# Patient Record
Sex: Female | Born: 1975 | Race: White | Hispanic: No | Marital: Married | Smoking: Former smoker
Health system: Southern US, Community
[De-identification: ages and names within clinical notes are randomized; demographics above are authoritative.]

## PROBLEM LIST (undated history)

## (undated) DIAGNOSIS — G43909 Migraine, unspecified, not intractable, without status migrainosus: Secondary | ICD-10-CM

## (undated) DIAGNOSIS — C4491 Basal cell carcinoma of skin, unspecified: Secondary | ICD-10-CM

## (undated) DIAGNOSIS — C4492 Squamous cell carcinoma of skin, unspecified: Secondary | ICD-10-CM

## (undated) HISTORY — DX: Squamous cell carcinoma of skin, unspecified: C44.92

## (undated) HISTORY — DX: Migraine, unspecified, not intractable, without status migrainosus: G43.909

## (undated) HISTORY — DX: Basal cell carcinoma of skin, unspecified: C44.91

---

## 1995-08-08 HISTORY — PX: CERVICAL CONIZATION W/BX: SHX1330

## 1999-03-03 ENCOUNTER — Emergency Department (HOSPITAL_COMMUNITY): Admission: EM | Admit: 1999-03-03 | Discharge: 1999-03-03 | Payer: Self-pay | Admitting: Emergency Medicine

## 1999-04-07 ENCOUNTER — Emergency Department (HOSPITAL_COMMUNITY): Admission: EM | Admit: 1999-04-07 | Discharge: 1999-04-07 | Payer: Self-pay | Admitting: Emergency Medicine

## 1999-11-02 ENCOUNTER — Other Ambulatory Visit: Admission: RE | Admit: 1999-11-02 | Discharge: 1999-11-02 | Payer: Self-pay | Admitting: Obstetrics and Gynecology

## 2000-10-29 ENCOUNTER — Other Ambulatory Visit: Admission: RE | Admit: 2000-10-29 | Discharge: 2000-10-29 | Payer: Self-pay | Admitting: Obstetrics and Gynecology

## 2002-01-02 ENCOUNTER — Other Ambulatory Visit: Admission: RE | Admit: 2002-01-02 | Discharge: 2002-01-02 | Payer: Self-pay | Admitting: Obstetrics and Gynecology

## 2003-01-29 ENCOUNTER — Other Ambulatory Visit: Admission: RE | Admit: 2003-01-29 | Discharge: 2003-01-29 | Payer: Self-pay | Admitting: Obstetrics and Gynecology

## 2004-06-09 ENCOUNTER — Other Ambulatory Visit: Admission: RE | Admit: 2004-06-09 | Discharge: 2004-06-09 | Payer: Self-pay | Admitting: Obstetrics and Gynecology

## 2005-06-28 ENCOUNTER — Other Ambulatory Visit: Admission: RE | Admit: 2005-06-28 | Discharge: 2005-06-28 | Payer: Self-pay | Admitting: Obstetrics and Gynecology

## 2006-06-08 ENCOUNTER — Emergency Department (HOSPITAL_COMMUNITY): Admission: EM | Admit: 2006-06-08 | Discharge: 2006-06-08 | Payer: Self-pay | Admitting: Emergency Medicine

## 2014-07-22 LAB — VITAMIN D 25 HYDROXY (VIT D DEFICIENCY, FRACTURES): Vit D, 25-Hydroxy: 30

## 2014-07-22 LAB — HM PAP SMEAR: HM Pap smear: NORMAL

## 2014-08-19 ENCOUNTER — Ambulatory Visit (INDEPENDENT_AMBULATORY_CARE_PROVIDER_SITE_OTHER): Payer: 59 | Admitting: Family Medicine

## 2014-08-19 ENCOUNTER — Encounter: Payer: Self-pay | Admitting: Family Medicine

## 2014-08-19 DIAGNOSIS — Z0000 Encounter for general adult medical examination without abnormal findings: Secondary | ICD-10-CM

## 2014-08-19 DIAGNOSIS — G43909 Migraine, unspecified, not intractable, without status migrainosus: Secondary | ICD-10-CM

## 2014-08-19 DIAGNOSIS — Z8589 Personal history of malignant neoplasm of other organs and systems: Secondary | ICD-10-CM | POA: Insufficient documentation

## 2014-08-19 DIAGNOSIS — Z8541 Personal history of malignant neoplasm of cervix uteri: Secondary | ICD-10-CM | POA: Insufficient documentation

## 2014-08-19 DIAGNOSIS — E559 Vitamin D deficiency, unspecified: Secondary | ICD-10-CM | POA: Insufficient documentation

## 2014-08-19 DIAGNOSIS — IMO0001 Reserved for inherently not codable concepts without codable children: Secondary | ICD-10-CM

## 2014-08-19 DIAGNOSIS — Z87891 Personal history of nicotine dependence: Secondary | ICD-10-CM | POA: Insufficient documentation

## 2014-08-19 HISTORY — DX: Migraine, unspecified, not intractable, without status migrainosus: G43.909

## 2014-08-19 NOTE — Progress Notes (Signed)
Megan Reddish, MD Phone: [phone]  Subjective:  Patient presents today to establish care and for annual physical. Formerly a patient of Dr. Molli Posey, MD with [facility]. Chief complaint-noted.   Regular exercise-not at present Have elliptical and complete home gym.  Interested in spin class.  Sleep 6 hours of sleep. Dog wakes her up.  Dr. Matthew Saras is her ob/gyn Pap 07/2014. Abnormal Pap 1998.  Unknown LMP? Birth control method. Mirena.  08-27-14 scheduled mammogram (turns 39). Had at age 45.   Migraine. No recurrence after having her son. Previously on flexeril.   Vit D deficiency 16 last year. 25000 d weekly 3 months. Takes MV-gummy. 800 IU. Dr. Matthew Saras just chedked it. Breast and pap with Dr. Matthew Saras.   ROS- Full ROS completed and negative specifically with no chest pain, shortness of breath, nausea, vomiting, diarrhea, constipation, unintentional weight loss.   The following were reviewed and entered/updated in epic: Past Medical History  Diagnosis Date  . Migraine 08/19/2014    No recurrence after birth of son. Previously on flexeril.     Patient Active Problem List   Diagnosis Date Noted  . History of squamous cell carcinoma 08/19/2014    Priority: Medium  . Cervical cancer 08/19/2014    Priority: Low  . Vitamin D deficiency 08/19/2014    Priority: Low  . Former smoker 08/19/2014    Priority: Low   Past Surgical History  Procedure Laterality Date  . Cervical conization w/bx  1997    cold knife, no abnormal paps since.   . Vaginal delivery  1998    Family History  Problem Relation Age of Onset  . Breast cancer Maternal Grandmother     54s  . Heart attack Father 41    smoked marijuana for many years  . Hypertension Father   . Diabetes Maternal Grandmother   . Hypertension Mother   . Depression Mother     "depression in entire family"  . Depression Father   . Dementia Maternal Grandmother     Medications- reviewed and  updated Current Outpatient Prescriptions  Medication Sig Dispense Refill  . bimatoprost (LATISSE) 0.03 % ophthalmic solution Place into both eyes at bedtime. Place one drop on applicator and apply evenly along the skin of the upper eyelid at base of eyelashes once daily at bedtime; repeat procedure for second eye (use a clean applicator).     No current facility-administered medications for this visit.    Allergies-reviewed and updated Allergies  Allergen Reactions  . Latex     swelling    History   Social History  . Marital Status: Married    Spouse Name: N/A    Number of Children: N/A  . Years of Education: N/A   Social History Main Topics  . Smoking status: Former Smoker -- 1.00 packs/day for 21 years    Types: Cigarettes    Quit date: 11/06/2010  . Smokeless tobacco: Not on file  . Alcohol Use: 3.0 oz/week    5 Not specified per week  . Drug Use: No  . Sexual Activity: Not on file   Other Topics Concern  . Not on file   Social History Narrative   Married 2015. Son 1998.    Finished HS.       Works as Software engineer for [employer]: football, reading, cooking, enjoys cleaning          ROS--See HPI , otherwise full ROS was completed and  negative except as noted above  Objective: BP 110/72 mmHg  Temp(Src) 97.9 F (36.6 C)  Wt 140 lb (63.504 kg) Gen: NAD, resting comfortably in chair, moves easily to table.  HEENT: Mucous membranes are moist. Oropharynx normal. Good dentition.  PERRLA, sclera and lids normal, long eyelashes Neck: no thyromegaly, no lymphadenopathy CV: RRR no murmurs rubs or gallops Lungs: CTAB no crackles, wheeze, rhonchi Abdomen: soft/nontender/nondistended/normal bowel sounds. No rebound or guarding. No hepatosplenomegaly Ext: no edema, 2+ PT pulses Skin: warm, dry, no rash Neuro: 5/5 strength upper and lower extremities, normal reflexes, normal gait   Assessment/Plan:  39 y.o. female presenting for  annual physical.  Health Maintenance counseling: 1. Anticipatory guidance: Patient counseled regarding regular dental exams, wearing seatbelts, wearing sunscreen. Advised regular sleep. Yearly dermatology follow up.  2. Risk factor reduction:  Advised patient of need for regular exercise and diet rich and fruits and vegetables to reduce risk of heart attack and stroke.  3. Immunizations/screenings/ancillary studies- had pap smear in 07/2014. Flu shot 05/2014. Tdap through HR when she started working at [employer].  4. Return for fasting labs.   Vitamin D deficiency Level as low as 16 last year and was treated though not clear follow up level. MV likely only has 400 IU. Dr. Matthew Saras recently checked and we will get records through his office.    1 year follow up  Future fasting labs. UA as former smoker.  Orders Placed This Encounter  Procedures  . CBC    [place]    Standing Status: Future     Number of Occurrences:      Standing Expiration Date: 08/20/2015  . Comprehensive metabolic panel    [place]    Standing Status: Future     Number of Occurrences:      Standing Expiration Date: 08/20/2015    Order Specific Question:  Has the patient fasted?    Answer:  No  . Lipid panel    [place]    Standing Status: Future     Number of Occurrences:      Standing Expiration Date: 08/20/2015    Order Specific Question:  Has the patient fasted?    Answer:  No  . TSH    [place]    Standing Status: Future     Number of Occurrences:      Standing Expiration Date: 08/20/2015  . POCT urinalysis dipstick    Standing Status: Future     Number of Occurrences:      Standing Expiration Date: 08/20/2015    Meds ordered this encounter  Medications  . bimatoprost (LATISSE) 0.03 % ophthalmic solution    Sig: Place into both eyes at bedtime. Place one drop on applicator and apply evenly along the skin of the upper eyelid at base of eyelashes once daily at bedtime; repeat procedure for second  eye (use a clean applicator).  to help lengthen eyelashes

## 2014-08-19 NOTE — Patient Instructions (Addendum)
Sign release of information at the front desk for Dr. Molli Posey (records for last 3 years in addition to any imaging, any labs, any paps, mammograms)  Recommend 150 minutes exercise per week.  Recommend increasing sleep to at least 7 hours.   I am happy to help with vitamin D if Dr. Matthew Saras forwards me results.   Recommend seeing dermatology at least yearly.   Come back for fasting labs probably next Tuesday.

## 2014-08-19 NOTE — Assessment & Plan Note (Signed)
Level as low as 16 last year and was treated though not clear follow up level. MV likely only has 400 IU. Dr. Matthew Saras recently checked and we will get records through his office.

## 2014-08-27 ENCOUNTER — Other Ambulatory Visit (INDEPENDENT_AMBULATORY_CARE_PROVIDER_SITE_OTHER): Payer: 59

## 2014-08-27 DIAGNOSIS — Z0000 Encounter for general adult medical examination without abnormal findings: Secondary | ICD-10-CM

## 2014-08-27 DIAGNOSIS — IMO0001 Reserved for inherently not codable concepts without codable children: Secondary | ICD-10-CM

## 2014-08-27 LAB — POCT URINALYSIS DIPSTICK
Glucose, UA: NEGATIVE
Leukocytes, UA: NEGATIVE
Nitrite, UA: NEGATIVE
Protein, UA: NEGATIVE
RBC UA: NEGATIVE
Spec Grav, UA: 1.025
Urobilinogen, UA: 0.2
pH, UA: 5.5

## 2014-08-27 LAB — COMPREHENSIVE METABOLIC PANEL
ALT: 18 U/L (ref 0–35)
AST: 19 U/L (ref 0–37)
Albumin: 4.1 g/dL (ref 3.5–5.2)
Alkaline Phosphatase: 42 U/L (ref 39–117)
BUN: 17 mg/dL (ref 6–23)
CO2: 30 mEq/L (ref 19–32)
Calcium: 9.1 mg/dL (ref 8.4–10.5)
Chloride: 102 mEq/L (ref 96–112)
Creatinine, Ser: 0.81 mg/dL (ref 0.40–1.20)
GFR: 83.23 mL/min (ref >60.00)
Glucose, Bld: 87 mg/dL (ref 70–99)
POTASSIUM: 4.2 meq/L (ref 3.5–5.1)
SODIUM: 137 meq/L (ref 135–145)
TOTAL PROTEIN: 6.7 g/dL (ref 6.0–8.3)
Total Bilirubin: 0.6 mg/dL (ref 0.2–1.2)

## 2014-08-27 LAB — LIPID PANEL
CHOLESTEROL: 125 mg/dL (ref 0–200)
HDL: 78.4 mg/dL (ref >39.00)
LDL Cholesterol: 33 mg/dL (ref 0–99)
NonHDL: 46.6
Total CHOL/HDL Ratio: 2
Triglycerides: 67 mg/dL (ref 0.0–149.0)
VLDL: 13.4 mg/dL (ref 0.0–40.0)

## 2014-08-27 LAB — CBC
HCT: 40.8 % (ref 36.0–46.0)
Hemoglobin: 13.9 g/dL (ref 12.0–15.0)
MCHC: 34.1 g/dL (ref 30.0–36.0)
MCV: 95.6 fl (ref 78.0–100.0)
Platelets: 212 10*3/uL (ref 150.0–400.0)
RBC: 4.27 Mil/uL (ref 3.87–5.11)
RDW: 13.6 % (ref 11.5–15.5)
WBC: 6.2 10*3/uL (ref 4.0–10.5)

## 2014-08-27 LAB — TSH: TSH: 2.56 u[IU]/mL (ref 0.35–4.50)

## 2014-09-23 ENCOUNTER — Other Ambulatory Visit: Payer: Self-pay | Admitting: Obstetrics and Gynecology

## 2014-09-23 DIAGNOSIS — R928 Other abnormal and inconclusive findings on diagnostic imaging of breast: Secondary | ICD-10-CM

## 2014-09-30 ENCOUNTER — Ambulatory Visit
Admission: RE | Admit: 2014-09-30 | Discharge: 2014-09-30 | Disposition: A | Discharge status: Home / Self Care | Payer: 59 | Source: Ambulatory Visit | Attending: Obstetrics and Gynecology | Admitting: Obstetrics and Gynecology

## 2014-09-30 ENCOUNTER — Other Ambulatory Visit: Payer: Self-pay | Admitting: Obstetrics and Gynecology

## 2014-09-30 ENCOUNTER — Encounter: Payer: Self-pay | Admitting: Family Medicine

## 2014-09-30 DIAGNOSIS — R928 Other abnormal and inconclusive findings on diagnostic imaging of breast: Secondary | ICD-10-CM

## 2014-11-03 ENCOUNTER — Encounter: Payer: Self-pay | Admitting: Family Medicine

## 2014-11-03 ENCOUNTER — Ambulatory Visit (INDEPENDENT_AMBULATORY_CARE_PROVIDER_SITE_OTHER): Payer: 59 | Admitting: Family Medicine

## 2014-11-03 VITALS — BP 102/70 | HR 80 | Temp 98.0°F | Wt 142.0 lb

## 2014-11-03 DIAGNOSIS — E538 Deficiency of other specified B group vitamins: Secondary | ICD-10-CM | POA: Diagnosis not present

## 2014-11-03 DIAGNOSIS — R5383 Other fatigue: Secondary | ICD-10-CM

## 2014-11-03 LAB — CBC WITH DIFFERENTIAL/PLATELET
BASOS ABS: 0 10*3/uL (ref 0.0–0.1)
Basophils Relative: 0.3 % (ref 0.0–3.0)
EOS ABS: 0 10*3/uL (ref 0.0–0.7)
EOS PCT: 0.3 % (ref 0.0–5.0)
HCT: 41 % (ref 36.0–46.0)
Hemoglobin: 14 g/dL (ref 12.0–15.0)
LYMPHS ABS: 1.9 10*3/uL (ref 0.7–4.0)
Lymphocytes Relative: 25.2 % (ref 12.0–46.0)
MCHC: 34.1 g/dL (ref 30.0–36.0)
MCV: 96.2 fl (ref 78.0–100.0)
Monocytes Absolute: 0.4 10*3/uL (ref 0.1–1.0)
Monocytes Relative: 4.9 % (ref 3.0–12.0)
NEUTROS ABS: 5.1 10*3/uL (ref 1.4–7.7)
Neutrophils Relative %: 69.3 % (ref 43.0–77.0)
PLATELETS: 218 10*3/uL (ref 150.0–400.0)
RBC: 4.26 Mil/uL (ref 3.87–5.11)
RDW: 13.3 % (ref 11.5–15.5)
WBC: 7.4 10*3/uL (ref 4.0–10.5)

## 2014-11-03 LAB — COMPREHENSIVE METABOLIC PANEL
ALBUMIN: 4.4 g/dL (ref 3.5–5.2)
ALK PHOS: 43 U/L (ref 39–117)
ALT: 18 U/L (ref 0–35)
AST: 19 U/L (ref 0–37)
BILIRUBIN TOTAL: 0.5 mg/dL (ref 0.2–1.2)
BUN: 10 mg/dL (ref 6–23)
CALCIUM: 9.5 mg/dL (ref 8.4–10.5)
CO2: 29 meq/L (ref 19–32)
Chloride: 100 mEq/L (ref 96–112)
Creatinine, Ser: 0.79 mg/dL (ref 0.40–1.20)
GFR: 85.58 mL/min (ref >60.00)
Glucose, Bld: 115 mg/dL — ABNORMAL HIGH (ref 70–99)
Potassium: 3.7 mEq/L (ref 3.5–5.1)
Sodium: 136 mEq/L (ref 135–145)
TOTAL PROTEIN: 7.4 g/dL (ref 6.0–8.3)

## 2014-11-03 LAB — FOLATE: FOLATE: 18.1 ng/mL (ref >5.9)

## 2014-11-03 LAB — TSH: TSH: 1.56 u[IU]/mL (ref 0.35–4.50)

## 2014-11-03 LAB — VITAMIN B12: Vitamin B-12: 286 pg/mL (ref 211–911)

## 2014-11-03 LAB — T4, FREE: Free T4: 0.73 ng/dL (ref 0.60–1.60)

## 2014-11-03 LAB — VITAMIN D 25 HYDROXY (VIT D DEFICIENCY, FRACTURES): VITD: 27.31 ng/mL — AB (ref 30.00–100.00)

## 2014-11-03 MED ORDER — VITAMIN D (ERGOCALCIFEROL) 1.25 MG (50000 UNIT) PO CAPS: 50000.0000 [IU] | ORAL_CAPSULE | ORAL | Status: DC

## 2014-11-03 NOTE — Patient Instructions (Addendum)
Fatigue  Evaluate with labs  Consider sleep study (suspect low yield)  No obvious cause from history  If labs not helpful, check in with me 2-3 months from now to give me an update.

## 2014-11-03 NOTE — Progress Notes (Addendum)
Garret Reddish, MD Phone: [phone]  Subjective:   Megan Mcmillan is a 39 y.o. year old very pleasant female patient who presents with the following:  Fatigue Difficulty sleeping -1 month of symptoms. Primary symptom is fatigue. She does have slight weight gain. She does feel warmer than usual as well. Her pHQ2 is 0. She states she feels the same  during a week or on the weekend. When it first started she thought it was allergies or sinus related but those symptoms resolved in a week or 2 but the fatigue persisted.   She is having difficulty sleeping. This has not been a recent problem. She is sleeping 2-3 hours and then wakes up and has difficulty getting back to sleep, will toss and turn. She is not taking any daytime naps. She states 5-6 years ago she had some trouble sleeping but was still sleeping longer and she is sleeping now usually 5 hours. Sleep difficulties have not been progressive.    She has been using a treadmill  But never after 6:30 PM.  She does have history of snoring but does not become progressive. She said she is eating better and has not been losing weight. It is discouraging that she is eating healthier and not seeing a lot of progress in weight department.   She does have a history of low B 12 and had injections in the past but has stopped. She does not have periods that she has an IUD in place. History of low vit D as well.   History of cervical cancer but most recent pap normal. Up to date on mammogram. No family history colon cancer. Sexually active with husband only.   ROS-She has no melena or bright red per rectum. No night sweats or unintentional weight loss. No abdominal pain, no headaches. No chest pain or shortness of breath.   Past Medical History- Patient Active Problem List   Diagnosis Date Noted  . History of squamous cell carcinoma 08/19/2014    Priority: Medium  . Cervical cancer 08/19/2014    Priority: Low  . Vitamin D deficiency 08/19/2014      Priority: Low  . Former smoker 08/19/2014    Priority: Low   Medications- reviewed and updated Current Outpatient Prescriptions  Medication Sig Dispense Refill  . bimatoprost (LATISSE) 0.03 % ophthalmic solution Place into both eyes at bedtime. Place one drop on applicator and apply evenly along the skin of the upper eyelid at base of eyelashes once daily at bedtime; repeat procedure for second eye (use a clean applicator).     No current facility-administered medications for this visit.    Objective: BP 102/70 mmHg  Pulse 80  Temp(Src) 98 F (36.7 C)  Wt 142 lb (64.411 kg) Gen: NAD, resting comfortably No thyromegaly, cervical lymphadenopathy not noted CV: RRR no murmurs rubs or gallops, 1 ectopic beat over 30 seconds Lungs: CTAB no crackles, wheeze, rhonchi Abdomen: soft/nontender/nondistended/normal bowel sounds. No rebound or guarding.  Ext: no edema Skin: warm, dry, no rash   Assessment/Plan:  Fatigue Difficulty sleeping Check labs as below. Depression screen negative. HIV primarily for screening once in lifetime purposes. Has had vitamin issus in past so b12/folate/vit D. Symptoms would be mixed for thyroid issues. Up to date on cancer screening as per HPI. Think malignancy less likely. OSA possible but not high likelihood. Could also discuss a potential sleep regimen or treatment if no improvement. Check in 2-3 months from now if labs largely normal.   Considered EKG  but rare ectopic beat and may not catch. In follow up if fatigue persists would consider.   Return precautions advised.   Orders Placed This Encounter  Procedures  . CBC with Differential/Platelet  . Comprehensive metabolic panel    [place]  . TSH    [place]  . T4, free    [place]  . Vitamin B12  . Folate  . Vit D  25 hydroxy (rtn osteoporosis monitoring)       Addendum: labs normal except low vitamin D, will replenish x 8 weeks therapy. Consider sleep medication if issues persist.

## 2014-11-03 NOTE — Addendum Note (Signed)
Addended by: Marin Olp on: 11/03/2014 06:21 PM   Modules accepted: Orders

## 2015-01-07 ENCOUNTER — Other Ambulatory Visit: Payer: Self-pay | Admitting: Family Medicine

## 2015-01-07 MED ORDER — VITAMIN D (ERGOCALCIFEROL) 1.25 MG (50000 UNIT) PO CAPS: 50000.0000 [IU] | ORAL_CAPSULE | ORAL | Status: DC

## 2015-01-21 ENCOUNTER — Other Ambulatory Visit: Payer: Self-pay

## 2015-01-21 MED ORDER — VITAMIN D (ERGOCALCIFEROL) 1.25 MG (50000 UNIT) PO CAPS: 50000.0000 [IU] | ORAL_CAPSULE | ORAL | Status: DC

## 2015-03-23 ENCOUNTER — Encounter: Payer: Self-pay | Admitting: Family Medicine

## 2015-04-29 ENCOUNTER — Encounter: Payer: Self-pay | Admitting: Family Medicine

## 2015-04-29 ENCOUNTER — Ambulatory Visit (INDEPENDENT_AMBULATORY_CARE_PROVIDER_SITE_OTHER): Payer: 59 | Admitting: Family Medicine

## 2015-04-29 ENCOUNTER — Other Ambulatory Visit (INDEPENDENT_AMBULATORY_CARE_PROVIDER_SITE_OTHER): Payer: 59

## 2015-04-29 VITALS — BP 118/82 | HR 76 | Wt 140.0 lb

## 2015-04-29 DIAGNOSIS — S4350XA Sprain of unspecified acromioclavicular joint, initial encounter: Secondary | ICD-10-CM | POA: Insufficient documentation

## 2015-04-29 DIAGNOSIS — S4351XA Sprain of right acromioclavicular joint, initial encounter: Secondary | ICD-10-CM

## 2015-04-29 DIAGNOSIS — M25511 Pain in right shoulder: Secondary | ICD-10-CM | POA: Diagnosis not present

## 2015-04-29 NOTE — Progress Notes (Signed)
Garret Reddish, MD  Subjective:  Megan Mcmillan is a 39 y.o. year old very pleasant female patient who presents with:  Right shoulder pain -Started 2 weeks ago. Started out not really worse with lifting arm above but over last 5 days has worsened, yesterday had dramatic increase in pain. No fall or injury. she Feels like she needs to pull it out of the socket to make it feel better. Woke up after she laid on that shoulder this morning when previously no issues with sleep. Feels deep into shoulder. Felt some radiation down right arm. 6/10 deep ache. Constant 2/10 sitting. No history shoulder injury. Not worse with neck movement. Did 5 days of aleve even through today without improvement ROS-  no chest pain or shortness of breath. Has not dropped objects. No hand or arm weakness.  Past Medical History-  Patient Active Problem List   Diagnosis Date Noted  . History of squamous cell carcinoma 08/19/2014    Priority: Medium  . Cervical cancer 08/19/2014    Priority: Low  . Vitamin D deficiency 08/19/2014    Priority: Low  . Former smoker 08/19/2014    Priority: Low  . Acromioclavicular sprain 04/29/2015   Medications- reviewed and updated Current Outpatient Prescriptions  Medication Sig Dispense Refill  . bimatoprost (LATISSE) 0.03 % ophthalmic solution Place into both eyes at bedtime. Place one drop on applicator and apply evenly along the skin of the upper eyelid at base of eyelashes once daily at bedtime; repeat procedure for second eye (use a clean applicator).    . Vitamin D, Ergocalciferol, (DRISDOL) 50000 UNITS CAPS capsule Take 1 capsule (50,000 Units total) by mouth every 7 (seven) days. 8 capsule 2   Objective: VS not obtained Gen: NAD, resting comfortably CV: RRR  Lungs: nonlabored Abdomen: nondistended, normal weight Ext: no edema  Right Shoulder: Inspection reveals no abnormalities, atrophy or asymmetry. Palpation is normal with no tenderness over AC joint or bicipital  groove. ROM is full in all planes but limited in fluidity by pain Rotator cuff strength normal throughout No signs of impingement with negative Neer and Hawkin's tests. Mildly positive empty can. Normal scapular function observed. Pain with abduction at 130 degrees. Forward flexion at 90 degrees painful.  No  drop arm sign.  Left shoulder normal  Assessment/Plan:  Right shoulder pain Discussed with patient that history and exam not classic for rotator cuff injury but I was concerned about bursitis potentially. Offered injection of subacromial space vs. Sports medicine evaluation. Patient opted to see Dr. Charlann Boxer of sports medicine with [facility]. At visit, patient found to have [facility] joint sprain with capsule distension but ultrasound also noted subacromial bursitis. She had an injection in [facility] joint. Patient will follow up with Dr. Tamala Julian prn but hopeful for full recovery.   Return precautions advised.   Orders Placed This Encounter  Procedures  . Ambulatory referral to Sports Medicine    Referral Priority:  Routine    Referral Type:  Consultation    Referred to Provider:  Lyndal Pulley, DO    Number of Visits Requested:  1

## 2015-04-29 NOTE — Progress Notes (Signed)
Pre visit review using our clinic review tool, if applicable. No additional management support is needed unless otherwise documented below in the visit note. 

## 2015-04-29 NOTE — Patient Instructions (Addendum)
We are going to set you up with Lynne Leader if you can get time off for this today or tomorrow given slightly atypical features but my strongest suspicion is bursitis. If we cannot get you in or have the time- let's do an injection today and then we can follow up with him if not improving

## 2015-04-29 NOTE — Patient Instructions (Signed)
Good to see you.  Ice 20 minutes 2 times daily. Usually after activity and before bed. Exercises 3 times a week.  duexis 3 times a day for 6 days.  Call me if not perfect10 days

## 2015-04-29 NOTE — Assessment & Plan Note (Signed)
Patient given injection today and tolerated the procedure well. We discussed icing regimen, oral anti-inflammatory for short course, in which activities to avoid. Patient given home exercises. Patient will come back and see me again if pain is not completely resolved.

## 2015-04-29 NOTE — Progress Notes (Signed)
[facility] [address] [place], [place] [address] Phone: [phone] Subjective:    I'm seeing this patient by the request  of:  Garret Reddish, MD   CC: Right shoulder pain  [id number] Megan Mcmillan is a 39 y.o. female coming in with complaint of right shoulder pain. Patient describes the pain as more of a dull throbbing aching sensation that hurts with certain motion. Patient states reaching behind her back seems to make it improved. Pain seems to be more on the anterior as well as superior aspect of the shoulder. States that sleeping on it can be very painful. Rates the severity of pain a 6 out of 10. Denies any radiation down the arm or any numbness. Patient has tried some mild icing and anti-inflammatory's with moderate improvement.  Past Medical History  Diagnosis Date  . Migraine 08/19/2014    No recurrence after birth of son. Previously on flexeril.     Past Surgical History  Procedure Laterality Date  . Cervical conization w/bx  1997    cold knife, no abnormal paps since.   . Vaginal delivery  1998   Social History  Substance Use Topics  . Smoking status: Former Smoker -- 1.00 packs/day for 21 years    Types: Cigarettes    Quit date: 11/06/2010  . Smokeless tobacco: None  . Alcohol Use: 3.0 oz/week    5 Standard drinks or equivalent per week   Allergies  Allergen Reactions  . Latex     swelling   Family History  Problem Relation Age of Onset  . Breast cancer Maternal Grandmother     73s  . Heart attack Father 41    smoked marijuana for many years  . Hypertension Father   . Diabetes Maternal Grandmother   . Hypertension Mother   . Depression Mother     "depression in entire family"  . Depression Father   . Dementia Maternal Grandmother         Past medical history, social, surgical and family history all reviewed in electronic medical record.   Review of Systems: No headache, visual changes, nausea, vomiting,  diarrhea, constipation, dizziness, abdominal pain, skin rash, fevers, chills, night sweats, weight loss, swollen lymph nodes, body aches, joint swelling, muscle aches, chest pain, shortness of breath, mood changes.   Objective Blood pressure 118/82, pulse 76, weight 140 lb (63.504 kg), SpO2 99 %.  General: No apparent distress alert and oriented x3 mood and affect normal, dressed appropriately.  HEENT: Pupils equal, extraocular movements intact  Respiratory: Patient's speak in full sentences and does not appear short of breath  Cardiovascular: No lower extremity edema, non tender, no erythema  Skin: Warm dry intact with no signs of infection or rash on extremities or on axial skeleton.  Abdomen: Soft nontender  Neuro: Cranial nerves II through XII are intact, neurovascularly intact in all extremities with 2+ DTRs and 2+ pulses.  Lymph: No lymphadenopathy of posterior or anterior cervical chain or axillae bilaterally.  Gait normal with good balance and coordination.  MSK:  Non tender with full range of motion and good stability and symmetric strength and tone of  elbows, wrist, hip, knee and ankles bilaterally.  Shoulder: Right Inspection reveals no abnormalities, atrophy or asymmetry. Palpation is normal with no tenderness over AC joint or bicipital groove. ROM is full in all planes passively. Rotator cuff strength normal throughout. signs of impingement with positive Neer and Hawkin's tests, but negative empty can sign. Speeds  and Yergason's tests normal. No labral pathology noted with negative Obrien's, negative clunk and good stability. Normal scapular function observed. Positive crossover sign No apprehension sign  MSK US performed of: Right This study was ordered, performed, and interpreted by Charlann Boxer D.O.  Shoulder:   Supraspinatus:  Appears normal on long and transverse views, Bursal bulge seen with shoulder abduction on impingement view. Infraspinatus:  Appears normal on  long and transverse views. Significant increase in Doppler flow Subscapularis:  Appears normal on long and transverse views. Positive bursa Teres Minor:  Appears normal on long and transverse views. AC joint:  Capsule distended. Glenohumeral Joint:  Appears normal without effusion. Glenoid Labrum:  Intact without visualized tears. Biceps Tendon:  Appears normal on long and transverse views, no fraying of tendon, tendon located in intertubercular groove, no subluxation with shoulder internal or external rotation.  Impression: Subacromial bursitis with acromial clavicular spurring  Procedure: Real-time Ultrasound Guided Injection of right acromial clavicular joint Device: GE Logiq E  Ultrasound guided injection is preferred based studies that show increased duration, increased effect, greater accuracy, decreased procedural pain, increased response rate with ultrasound guided versus blind injection.  Verbal informed consent obtained.  Time-out conducted.  Noted no overlying erythema, induration, or other signs of local infection.  Skin prepped in a sterile fashion.  Local anesthesia: Topical Ethyl chloride.  With sterile technique and under real time ultrasound guidance:  Joint visualized.  25g 1 inch needle inserted posterior approach. Pictures taken for needle placement. Patient did have injection of , 1 cc of 0.5% Marcaine, and 1.0 cc of Kenalog 40 mg/dL. Completed without difficulty  Pain immediately resolved suggesting accurate placement of the medication.  Advised to call if fevers/chills, erythema, induration, drainage, or persistent bleeding.  Images permanently stored and available for review in the ultrasound unit.  Impression: Technically successful ultrasound guided injection.    Impression and Recommendations:     This case required medical decision making of moderate complexity.

## 2015-06-03 ENCOUNTER — Ambulatory Visit: Payer: 59 | Admitting: Family Medicine

## 2015-06-08 ENCOUNTER — Encounter: Payer: Self-pay | Admitting: Family Medicine

## 2015-06-08 ENCOUNTER — Ambulatory Visit (INDEPENDENT_AMBULATORY_CARE_PROVIDER_SITE_OTHER): Payer: 59 | Admitting: Family Medicine

## 2015-06-08 VITALS — BP 118/70 | Temp 98.1°F | Wt 140.0 lb

## 2015-06-08 DIAGNOSIS — L570 Actinic keratosis: Secondary | ICD-10-CM

## 2015-06-08 NOTE — Patient Instructions (Signed)
If these lesions blister- use vaseline or neosporin and cover with bandaid. No other special precautions.   If lesions do not resolve, we could try 1 more repeat but if persist past that especially for bigger lesion- have you see Dr. Tonia Brooms

## 2015-06-08 NOTE — Progress Notes (Signed)
Actinic keratosis S: Has noted several lesions on her extremities similar to previous ones that were "burned off" by Dr. Crista Luria of dermatology O: erythematous base with scale noted in several areas 3 right calf- 1 is 1 cm in size, others 3-5 mm. 1 on each inner lower leg. Left upper arm x 2 for total of 7 lesions total 1 area on left forearm with 3 small circular lesions- 3 likely small flat warts A/P: Cryotherapy was performed on 7 lesions. Discussed skin protection (at least sunscreen if going to be outside more than 10 minutes. Advised to return to Dr. Tonia Brooms if any lesion does not resolve though we could trial one repeat cryotherapy cycle.     Procedure only visit- discussed aftercare, also see AVS

## 2015-06-08 NOTE — Assessment & Plan Note (Signed)
S: Has noted several lesions on her extremities similar to previous ones that were "burned off" by Dr. Crista Luria of dermatology O: erythematous base with scale noted in several areas 3 right calf- 1 is 1 cm in size, others 3-5 mm. 1 on each inner lower leg. Left upper arm x 2 for total of 7 lesions total 1 area on left forearm with 3 small circular lesions- 3 likely small flat warts A/P: Cryotherapy was performed on 7 lesions. Discussed skin protection (at least sunscreen if going to be outside more than 10 minutes. Advised to return to Dr. Tonia Brooms if any lesion does not resolve though we could trial one repeat cryotherapy cycle.

## 2015-07-15 ENCOUNTER — Encounter: Payer: Self-pay | Admitting: Family Medicine

## 2015-07-15 MED ORDER — AMOXICILLIN-POT CLAVULANATE 875-125 MG PO TABS: 1.0000 | ORAL_TABLET | Freq: Two times a day (BID) | ORAL | Status: DC

## 2015-07-15 MED ORDER — PREDNISONE 20 MG PO TABS: ORAL_TABLET | ORAL | Status: DC

## 2015-08-06 ENCOUNTER — Telehealth: Payer: 59 | Admitting: Family

## 2015-08-06 DIAGNOSIS — B9689 Other specified bacterial agents as the cause of diseases classified elsewhere: Secondary | ICD-10-CM

## 2015-08-06 DIAGNOSIS — A499 Bacterial infection, unspecified: Secondary | ICD-10-CM

## 2015-08-06 DIAGNOSIS — J329 Chronic sinusitis, unspecified: Secondary | ICD-10-CM | POA: Diagnosis not present

## 2015-08-06 NOTE — Progress Notes (Signed)
We are sorry that you are not feeling well.  Here is how we plan to help!  Based on what you have shared with me it looks like you have sinusitis.  Sinusitis is inflammation and infection in the sinus cavities of the head.  Based on your presentation I believe you most likely have Acute Bacterial Sinusitis.  This is an infection caused by bacteria and is treated with antibiotics. You may use an oral decongestant such as Mucinex D or if you have glaucoma or high blood pressure use plain Mucinex. Saline nasal spray help and can safely be used as often as needed for congestion.  If you develop worsening sinus pain, fever or notice severe headache and vision changes, or if symptoms are not better after completion of antibiotic, please schedule an appointment with a health care provider.    Sinus infections are not as easily transmitted as other respiratory infection, however we still recommend that you avoid close contact with loved ones, especially the very young and elderly.  Remember to wash your hands thoroughly throughout the day as this is the number one way to prevent the spread of infection!  *Since you took the 7 day course of Augmentin, that means it finished 3 days ago. I cannot prescribe more antibiotics at this time. Given that the cough had been present since November 3rd, that is more consistent with a chronic cough, which could be secondary to numerous factors, including your smoking history. However, the blocked sinuses and deeper cough can indicate infection. I would continue to do the supportive care included in this note. The antibiotics likely eliminated the infection and now your body is recovering.*  Home Care:  Only take medications as instructed by your medical team.  Complete the entire course of an antibiotic.  Do not take these medications with alcohol.  A steam or ultrasonic humidifier can help congestion.  You can place a towel over your head and breathe in the steam from  hot water coming from a faucet.  Avoid close contacts especially the very young and the elderly.  Cover your mouth when you cough or sneeze.  Always remember to wash your hands.  Get Help Right Away If:  You develop worsening fever or sinus pain.  You develop a severe head ache or visual changes.  Your symptoms persist after you have completed your treatment plan.  Make sure you  Understand these instructions.  Will watch your condition.  Will get help right away if you are not doing well or get worse.  Your e-visit answers were reviewed by a board certified advanced clinical practitioner to complete your personal care plan.  Depending on the condition, your plan could have included both over the counter or prescription medications.  If there is a problem please reply  once you have received a response from your provider.  Your safety is important to [place].  If you have drug allergies check your prescription carefully.    You can use MyChart to ask questions about today's visit, request a non-urgent call back, or ask for a work or school excuse for 24 hours related to this e-Visit. If it has been greater than 24 hours you will need to follow up with your provider, or enter a new e-Visit to address those concerns.  You will get an e-mail in the next two days asking about your experience.  I hope that your e-visit has been valuable and will speed your recovery. Thank you for using e-visits.

## 2015-08-11 DIAGNOSIS — Z30431 Encounter for routine checking of intrauterine contraceptive device: Secondary | ICD-10-CM | POA: Diagnosis not present

## 2015-08-11 DIAGNOSIS — Z6822 Body mass index (BMI) 22.0-22.9, adult: Secondary | ICD-10-CM | POA: Diagnosis not present

## 2015-08-11 DIAGNOSIS — Z01419 Encounter for gynecological examination (general) (routine) without abnormal findings: Secondary | ICD-10-CM | POA: Diagnosis not present

## 2015-08-13 ENCOUNTER — Ambulatory Visit (INDEPENDENT_AMBULATORY_CARE_PROVIDER_SITE_OTHER): Payer: 59 | Admitting: Family Medicine

## 2015-08-13 ENCOUNTER — Encounter: Payer: Self-pay | Admitting: Family Medicine

## 2015-08-13 VITALS — BP 120/70 | HR 82 | Temp 97.7°F | Wt 141.0 lb

## 2015-08-13 DIAGNOSIS — J329 Chronic sinusitis, unspecified: Secondary | ICD-10-CM | POA: Diagnosis not present

## 2015-08-13 DIAGNOSIS — J0110 Acute frontal sinusitis, unspecified: Secondary | ICD-10-CM | POA: Diagnosis not present

## 2015-08-13 MED ORDER — LEVOFLOXACIN 500 MG PO TABS: 500.0000 mg | ORAL_TABLET | Freq: Every day | ORAL | Status: DC

## 2015-08-13 MED ORDER — METHYLPREDNISOLONE ACETATE 80 MG/ML IJ SUSP
80.0000 mg | Freq: Once | INTRAMUSCULAR | Status: AC
  Administered 2015-08-13: 80 mg via INTRAMUSCULAR

## 2015-08-13 NOTE — Addendum Note (Signed)
Addended by: Clyde Lundborg A on: 08/13/2015 04:26 PM   Modules accepted: Orders

## 2015-08-13 NOTE — Patient Instructions (Signed)
Sinusitis (refractory to initial antibiotic treatment)  Levaquin 10-14 days (we will talk on 08/23/15 and see if we will do full 14 days) Steroid injection today  Consider ENT if symptoms persist after this prolonged course   Serious adverse reactions:[US Boxed Warning]: Fluoroquinolones are associated with disabling and potentially irreversible serious adverse reactions that may occur together, including tendinitis and tendon rupture, peripheral neuropathy, and CNS effects. Discontinue levofloxacin immediately and avoid use of fluoroquinolones in patients who experience any of these serious adverse reactions. Patients of any age or without preexisting risk factors have experienced these reactions; may occur within hours to weeks after initiation.

## 2015-08-13 NOTE — Progress Notes (Signed)
PCP: Garret Reddish, MD  Subjective:  Megan Mcmillan is a 40 y.o. year old very pleasant female patient who presents with sinusitis symptoms including nasal congestion, sinus tenderness. Patient has had nearly 2 months of symptoms at this point. Was treated about 6 weeks ago with prednisone alone with little improvement. Later by about 10 days filled the augmentin and after that course was about 75% better before symptoms worsened again. She had tried to tolerate symptoms but then over last Mcmillan worsened again. Draining yellow and green discharge from nose with occasional twinge of blood as well. Left frontal area particularly tender. Fatigue has worsened. This is the worst she has felt.  -day of illness:>6 weeks -started: about 2 months ago -Symptoms are worsening -previous treatments: augmentin, prednisone -sick contacts/travel/risks: denies flu exposure.   ROS-denies fever, SOB, NVD, tooth pain  Pertinent Past Medical History-  Patient Active Problem List   Diagnosis Date Noted  . History of squamous cell carcinoma 08/19/2014    Priority: Medium  . Cervical cancer ([place]) 08/19/2014    Priority: Low  . Vitamin D deficiency 08/19/2014    Priority: Low  . Former smoker 08/19/2014    Priority: Low  . Actinic keratosis 06/08/2015  . Acromioclavicular sprain 04/29/2015   Medications- reviewed  Current Outpatient Prescriptions  Medication Sig Dispense Refill  . bimatoprost (LATISSE) 0.03 % ophthalmic solution Place into both eyes at bedtime. Place one drop on applicator and apply evenly along the skin of the upper eyelid at base of eyelashes once daily at bedtime; repeat procedure for second eye (use a clean applicator).    . Vitamin D, Ergocalciferol, (DRISDOL) 50000 UNITS CAPS capsule Take 1 capsule (50,000 Units total) by mouth every 7 (seven) days. 8 capsule 2   Objective: BP 120/70 mmHg  Pulse 82  Temp(Src) 97.7 F (36.5 C)  Wt 141 lb (63.957 kg)  SpO2 98% Gen: NAD, resting  comfortably HEENT: Turbinates erythematous with greeb drainage, TM normal, pharynx mildly erythematous with no tonsilar exudate or edema, left frontal sinus tenderness most prominent CV: RRR no murmurs rubs or gallops Lungs: CTAB no crackles, wheeze, rhonchi Ext: no edema Skin: warm, dry, no rash Neuro: grossly normal, moves all extremities  Assessment/Plan:  Sinsusitis Bacterial based on: Symptoms >10 days, double sickening. In fact symptoms have been ongoing nearly 2 months and failed initial trial prednisone and later 7 days of augmentin  Treatment: -considered steroid: patient opted in and given depo medrol 80mg  -other symptomatic care with rest, hydration -Antibiotic indicated: yes. Already failed augmentin. We will do 10-14 course of levaquin and if persists would refer to ENT  Finally, we reviewed reasons to return to care including if symptoms worsen or persist or new concerns arise.  Meds ordered this encounter  Medications  . levofloxacin (LEVAQUIN) 500 MG tablet    Sig: Take 1 tablet (500 mg total) by mouth daily.    Dispense:  14 tablet    Refill:  0  . methylPREDNISolone acetate (DEPO-MEDROL) injection 80 mg    Sig:

## 2015-09-13 ENCOUNTER — Other Ambulatory Visit: Payer: Self-pay | Admitting: Adult Health

## 2015-09-13 MED ORDER — FLUCONAZOLE 150 MG PO TABS: 150.0000 mg | ORAL_TABLET | Freq: Once | ORAL | Status: DC

## 2015-10-11 ENCOUNTER — Other Ambulatory Visit: Payer: Self-pay

## 2015-10-11 DIAGNOSIS — Z1231 Encounter for screening mammogram for malignant neoplasm of breast: Secondary | ICD-10-CM

## 2015-11-02 ENCOUNTER — Ambulatory Visit
Admission: RE | Admit: 2015-11-02 | Discharge: 2015-11-02 | Disposition: A | Discharge status: Home / Self Care | Payer: 59 | Source: Ambulatory Visit

## 2015-11-02 DIAGNOSIS — Z1231 Encounter for screening mammogram for malignant neoplasm of breast: Secondary | ICD-10-CM

## 2015-12-28 ENCOUNTER — Other Ambulatory Visit (INDEPENDENT_AMBULATORY_CARE_PROVIDER_SITE_OTHER): Payer: 59

## 2015-12-28 DIAGNOSIS — Z0000 Encounter for general adult medical examination without abnormal findings: Secondary | ICD-10-CM

## 2015-12-28 LAB — LIPID PANEL
CHOL/HDL RATIO: 2
Cholesterol: 151 mg/dL (ref 0–200)
HDL: 68.1 mg/dL (ref >39.00)
LDL CALC: 70 mg/dL (ref 0–99)
NONHDL: 82.89
TRIGLYCERIDES: 63 mg/dL (ref 0.0–149.0)
VLDL: 12.6 mg/dL (ref 0.0–40.0)

## 2015-12-28 LAB — BASIC METABOLIC PANEL
BUN: 14 mg/dL (ref 6–23)
CALCIUM: 9.4 mg/dL (ref 8.4–10.5)
CHLORIDE: 104 meq/L (ref 96–112)
CO2: 28 meq/L (ref 19–32)
Creatinine, Ser: 0.75 mg/dL (ref 0.40–1.20)
GFR: 90.35 mL/min (ref >60.00)
GLUCOSE: 88 mg/dL (ref 70–99)
Potassium: 4.5 mEq/L (ref 3.5–5.1)
SODIUM: 138 meq/L (ref 135–145)

## 2015-12-28 LAB — CBC WITH DIFFERENTIAL/PLATELET
BASOS ABS: 0 10*3/uL (ref 0.0–0.1)
Basophils Relative: 0.4 % (ref 0.0–3.0)
Eosinophils Absolute: 0.1 10*3/uL (ref 0.0–0.7)
Eosinophils Relative: 1.1 % (ref 0.0–5.0)
HCT: 41.1 % (ref 36.0–46.0)
Hemoglobin: 13.9 g/dL (ref 12.0–15.0)
LYMPHS ABS: 1.4 10*3/uL (ref 0.7–4.0)
Lymphocytes Relative: 20.4 % (ref 12.0–46.0)
MCHC: 33.8 g/dL (ref 30.0–36.0)
MCV: 97.1 fl (ref 78.0–100.0)
MONOS PCT: 6.7 % (ref 3.0–12.0)
Monocytes Absolute: 0.4 10*3/uL (ref 0.1–1.0)
NEUTROS ABS: 4.8 10*3/uL (ref 1.4–7.7)
NEUTROS PCT: 71.4 % (ref 43.0–77.0)
PLATELETS: 232 10*3/uL (ref 150.0–400.0)
RBC: 4.23 Mil/uL (ref 3.87–5.11)
RDW: 12.9 % (ref 11.5–15.5)
WBC: 6.7 10*3/uL (ref 4.0–10.5)

## 2015-12-28 LAB — HEPATIC FUNCTION PANEL
ALK PHOS: 37 U/L — AB (ref 39–117)
ALT: 19 U/L (ref 0–35)
AST: 16 U/L (ref 0–37)
Albumin: 4.4 g/dL (ref 3.5–5.2)
BILIRUBIN DIRECT: 0.1 mg/dL (ref 0.0–0.3)
BILIRUBIN TOTAL: 0.4 mg/dL (ref 0.2–1.2)
TOTAL PROTEIN: 6.8 g/dL (ref 6.0–8.3)

## 2015-12-28 LAB — POC URINALSYSI DIPSTICK (AUTOMATED)
BILIRUBIN UA: NEGATIVE
Blood, UA: NEGATIVE
GLUCOSE UA: NEGATIVE
KETONES UA: NEGATIVE
LEUKOCYTES UA: NEGATIVE
NITRITE UA: NEGATIVE
PH UA: 6.5
Protein, UA: NEGATIVE
Spec Grav, UA: 1.01
Urobilinogen, UA: 0.2

## 2015-12-28 LAB — TSH: TSH: 1.99 u[IU]/mL (ref 0.35–4.50)

## 2015-12-28 LAB — VITAMIN B12: Vitamin B-12: 318 pg/mL (ref 211–911)

## 2015-12-28 LAB — VITAMIN D 25 HYDROXY (VIT D DEFICIENCY, FRACTURES): VITD: 35.87 ng/mL (ref 30.00–100.00)

## 2015-12-30 ENCOUNTER — Encounter: Payer: Self-pay | Admitting: Family Medicine

## 2015-12-30 ENCOUNTER — Ambulatory Visit (INDEPENDENT_AMBULATORY_CARE_PROVIDER_SITE_OTHER): Payer: 59 | Admitting: Family Medicine

## 2015-12-30 VITALS — BP 90/62 | HR 75 | Temp 98.0°F | Ht 64.76 in | Wt 142.0 lb

## 2015-12-30 DIAGNOSIS — Z0000 Encounter for general adult medical examination without abnormal findings: Secondary | ICD-10-CM | POA: Diagnosis not present

## 2015-12-30 NOTE — Patient Instructions (Addendum)
Only suggestion is to start exercising again. Add core strengthening program. If back not better in 4 weeks or so- go see Megan Mcmillan. Consider changing mattress.   Vitamin D looks great. COuld consider MV with b12 and vitamin D. Or just get plain vitamin D 1000 units a day and b12 1000 mg a day.

## 2015-12-30 NOTE — Progress Notes (Signed)
Phone: [phone]  Subjective:  Patient presents today for their annual physical. Chief complaint-noted.   See problem oriented charting- ROS- full  review of systems was completed and negative including No chest pain or shortness of breath. No headache or blurry vision. Does have some low back pain but no weakness.   The following were reviewed and entered/updated in epic: Past Medical History  Diagnosis Date  . Migraine 08/19/2014    No recurrence after birth of son. Previously on flexeril.     Patient Active Problem List   Diagnosis Date Noted  . History of squamous cell carcinoma 08/19/2014    Priority: Medium  . Cervical cancer ([place]) 08/19/2014    Priority: Low  . Vitamin D deficiency 08/19/2014    Priority: Low  . Former smoker 08/19/2014    Priority: Low  . Actinic keratosis 06/08/2015  . Acromioclavicular sprain 04/29/2015   Past Surgical History  Procedure Laterality Date  . Cervical conization w/bx  1997    cold knife, no abnormal paps since.   . Vaginal delivery  1998    Family History  Problem Relation Age of Onset  . Breast cancer Maternal Grandmother     29s  . Heart attack Father 41    smoked marijuana for many years  . Hypertension Father   . Diabetes Maternal Grandmother   . Hypertension Mother   . Depression Mother     "depression in entire family"  . Depression Father   . Dementia Maternal Grandmother     Medications- reviewed and updated Current Outpatient Prescriptions  Medication Sig Dispense Refill  . bimatoprost (LATISSE) 0.03 % ophthalmic solution Place into both eyes at bedtime. Place one drop on applicator and apply evenly along the skin of the upper eyelid at base of eyelashes once daily at bedtime; repeat procedure for second eye (use a clean applicator).    . tretinoin (RETIN-A) 0.025 % cream Apply topically at bedtime.     No current facility-administered medications for this visit.    Allergies-reviewed and  updated Allergies  Allergen Reactions  . Latex     swelling    Social History   Social History  . Marital Status: Married    Spouse Name: N/A  . Number of Children: N/A  . Years of Education: N/A   Social History Main Topics  . Smoking status: Former Smoker -- 1.00 packs/day for 21 years    Types: Cigarettes    Quit date: 11/06/2010  . Smokeless tobacco: None  . Alcohol Use: 3.0 oz/week    5 Standard drinks or equivalent per week  . Drug Use: No  . Sexual Activity: Not Asked   Other Topics Concern  . None   Social History Narrative   Married 2015. Son 1998.    Finished HS.       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 Oropharynx normal Neck: no thyromegaly CV: RRR no murmurs rubs or gallops Lungs: CTAB no crackles, wheeze, rhonchi Abdomen: soft/nontender/nondistended/normal bowel sounds. No rebound or guarding.  Ext: no edema Skin: warm, dry Neuro: grossly normal, moves all extremities, PERRLA  Assessment/Plan:  40  y.o. female presenting for annual physical.  Health Maintenance counseling: 1. Anticipatory guidance: Patient counseled regarding regular dental exams, eye exams- last year was given glasses but makes her dizzy (may just use readers), wearing seatbelts.  2. Risk factor reduction:  Advised patient of need for regular exercise (needs to pick back up) and diet rich and fruits and vegetables to reduce risk of heart attack and stroke.  Wt Readings from Last 3 Encounters:  12/30/15 142 lb (64.411 kg)  08/13/15 141 lb (63.957 kg)  06/08/15 140 lb (63.504 kg)  3. Immunizations/screenings/ancillary studies Immunization History  Administered Date(s) Administered   . Influenza-Unspecified 05/21/2014, 05/21/2015  . Td 08/08/2013   4. Cervical cancer screening- history cervical cancer cold knife 1997. Yearly Paps with GYN  Due to history 5. Breast cancer screening-  breast exam with GYN and mammogram 11/03/15 normal 6. Colon cancer screening - start at age 11 7. Skin cancer screening- advised yearly dermatology visits with history of basal cell  Status of chronic or acute concerns  Low Back pain 2-3 weeks. Has been out of exercising for a while. Worse at work. Fidgets while sitting due to back discomfort. Central low back sparing midline. Exam reasssuring. Is going to try some core strengthening. Consider mattress change. Reach out to me if not improved and will refer to Charlann Boxer of sports medicine.    Return in about 18 months (around 07/01/2017). Return precautions advised.    Meds ordered this encounter  Medications  . tretinoin (RETIN-A) 0.025 % cream    Sig: Apply topically at bedtime.    Garret Reddish, MD

## 2016-03-02 DIAGNOSIS — Z30433 Encounter for removal and reinsertion of intrauterine contraceptive device: Secondary | ICD-10-CM | POA: Diagnosis not present

## 2016-03-02 DIAGNOSIS — Z3202 Encounter for pregnancy test, result negative: Secondary | ICD-10-CM | POA: Diagnosis not present

## 2016-04-13 DIAGNOSIS — Z30431 Encounter for routine checking of intrauterine contraceptive device: Secondary | ICD-10-CM | POA: Diagnosis not present

## 2016-06-14 DIAGNOSIS — Z86018 Personal history of other benign neoplasm: Secondary | ICD-10-CM | POA: Diagnosis not present

## 2016-06-14 DIAGNOSIS — D1801 Hemangioma of skin and subcutaneous tissue: Secondary | ICD-10-CM | POA: Diagnosis not present

## 2016-06-14 DIAGNOSIS — L570 Actinic keratosis: Secondary | ICD-10-CM | POA: Diagnosis not present

## 2016-06-14 DIAGNOSIS — D485 Neoplasm of uncertain behavior of skin: Secondary | ICD-10-CM | POA: Diagnosis not present

## 2016-06-14 DIAGNOSIS — D225 Melanocytic nevi of trunk: Secondary | ICD-10-CM | POA: Diagnosis not present

## 2016-06-14 DIAGNOSIS — Z85828 Personal history of other malignant neoplasm of skin: Secondary | ICD-10-CM | POA: Diagnosis not present

## 2016-06-14 DIAGNOSIS — L821 Other seborrheic keratosis: Secondary | ICD-10-CM | POA: Diagnosis not present

## 2016-06-14 DIAGNOSIS — C44519 Basal cell carcinoma of skin of other part of trunk: Secondary | ICD-10-CM | POA: Diagnosis not present

## 2016-06-14 DIAGNOSIS — C44319 Basal cell carcinoma of skin of other parts of face: Secondary | ICD-10-CM | POA: Diagnosis not present

## 2016-06-14 DIAGNOSIS — L814 Other melanin hyperpigmentation: Secondary | ICD-10-CM | POA: Diagnosis not present

## 2016-07-27 DIAGNOSIS — C44519 Basal cell carcinoma of skin of other part of trunk: Secondary | ICD-10-CM | POA: Diagnosis not present

## 2016-07-27 DIAGNOSIS — C44319 Basal cell carcinoma of skin of other parts of face: Secondary | ICD-10-CM | POA: Diagnosis not present

## 2016-08-17 DIAGNOSIS — Z01419 Encounter for gynecological examination (general) (routine) without abnormal findings: Secondary | ICD-10-CM | POA: Diagnosis not present

## 2016-08-17 DIAGNOSIS — Z6823 Body mass index (BMI) 23.0-23.9, adult: Secondary | ICD-10-CM | POA: Diagnosis not present

## 2016-10-05 ENCOUNTER — Other Ambulatory Visit: Payer: Self-pay | Admitting: Family Medicine

## 2016-10-05 DIAGNOSIS — Z1231 Encounter for screening mammogram for malignant neoplasm of breast: Secondary | ICD-10-CM

## 2016-11-02 ENCOUNTER — Ambulatory Visit: Payer: 59

## 2016-11-10 ENCOUNTER — Ambulatory Visit
Admission: RE | Admit: 2016-11-10 | Discharge: 2016-11-10 | Disposition: A | Discharge status: Home / Self Care | Payer: 59 | Source: Ambulatory Visit | Attending: Family Medicine | Admitting: Family Medicine

## 2016-11-10 DIAGNOSIS — Z1231 Encounter for screening mammogram for malignant neoplasm of breast: Secondary | ICD-10-CM

## 2016-12-29 ENCOUNTER — Ambulatory Visit (INDEPENDENT_AMBULATORY_CARE_PROVIDER_SITE_OTHER): Payer: 59 | Admitting: Family Medicine

## 2016-12-29 ENCOUNTER — Encounter: Payer: Self-pay | Admitting: Family Medicine

## 2016-12-29 VITALS — BP 118/72 | HR 97 | Temp 97.5°F | Ht 66.0 in | Wt 145.4 lb

## 2016-12-29 DIAGNOSIS — E559 Vitamin D deficiency, unspecified: Secondary | ICD-10-CM | POA: Diagnosis not present

## 2016-12-29 DIAGNOSIS — Z0000 Encounter for general adult medical examination without abnormal findings: Secondary | ICD-10-CM

## 2016-12-29 LAB — VITAMIN D 25 HYDROXY (VIT D DEFICIENCY, FRACTURES): VITD: 34.95 ng/mL (ref 30.00–100.00)

## 2016-12-29 NOTE — Patient Instructions (Addendum)
Please stop by lab before you go  I think you are doing great- thrilled you added exercise in.

## 2016-12-29 NOTE — Progress Notes (Signed)
Phone: [phone]  Subjective:  Patient presents today for their annual physical. Chief complaint-noted.   See problem oriented charting- ROS- full  review of systems was completed and negative including No chest pain or shortness of breath. No headache or blurry vision.   The following were reviewed and entered/updated in epic: Past Medical History:  Diagnosis Date  . Migraine 08/19/2014   No recurrence after birth of son. Previously on flexeril.     Patient Active Problem List   Diagnosis Date Noted  . History of squamous cell carcinoma 08/19/2014    Priority: Medium  . Actinic keratosis 06/08/2015    Priority: Low  . Acromioclavicular sprain 04/29/2015    Priority: Low  . Cervical cancer ([place]) 08/19/2014    Priority: Low  . Vitamin D deficiency 08/19/2014    Priority: Low  . Former smoker 08/19/2014    Priority: Low   Past Surgical History:  Procedure Laterality Date  . CERVICAL CONIZATION W/BX  1997   cold knife, no abnormal paps since.   Marland Kitchen VAGINAL DELIVERY  1998    Family History  Problem Relation Age of Onset  . Hypertension Mother   . Depression Mother        "depression in entire family"  . Heart attack Father 41       smoked marijuana for many years  . Hypertension Father   . Depression Father   . Breast cancer Maternal Grandmother        59s  . Diabetes Maternal Grandmother   . Dementia Maternal Grandmother     Medications- reviewed and updated Current Outpatient Prescriptions  Medication Sig Dispense Refill  . bimatoprost (LATISSE) 0.03 % ophthalmic solution Place into both eyes at bedtime. Place one drop on applicator and apply evenly along the skin of the upper eyelid at base of eyelashes once daily at bedtime; repeat procedure for second eye (use a clean applicator).    . tretinoin (RETIN-A) 0.025 % cream Apply topically at bedtime.    OTC- claritin and flonase  Allergies-reviewed and updated Allergies  Allergen Reactions  . Latex    swelling    Social History   Social History  . Marital status: Married    Spouse name: N/A  . Number of children: N/A  . Years of education: N/A   Social History Main Topics  . Smoking status: Former Smoker    Packs/day: 1.00    Years: 21.00    Types: Cigarettes    Quit date: 11/06/2010  . Smokeless tobacco: Never Used  . Alcohol use 3.0 oz/week    5 Standard drinks or equivalent per week  . Drug use: No  . Sexual activity: Not Asked   Other Topics Concern  . None   Social History Narrative   Married 2015. Son [address] [name]- also patient of Dr. Yong Channel.     Finished HS.       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       Hobbies: football, reading, cooking, enjoys cleaning          Objective: BP 118/72 (BP Location: Left Arm, Patient Position: Sitting, Cuff Size: Normal)   Pulse 97   Temp 97.5 F (36.4 C) (Oral)   Ht 5\' 6"  (1.676 m)   Wt 145 lb 6.4 oz (66 kg)   SpO2 97%   BMI 23.47 kg/m  Gen: NAD, resting comfortably HEENT: Mucous membranes are moist. Oropharynx normal Neck: no thyromegaly CV: RRR no murmurs rubs  or gallops Lungs: CTAB no crackles, wheeze, rhonchi Abdomen: soft/nontender/nondistended/normal bowel sounds. No rebound or guarding.  Ext: no edema Skin: warm, dry Neuro: grossly normal, moves all extremities, PERRLA  Assessment/Plan:  41 y.o. female presenting for annual physical.  Health Maintenance counseling: 1. Anticipatory guidance: Patient counseled regarding regular dental exams q6 months, eye exams - far sighted- has glasses - hasnt been in 2 years, wearing seatbelts.  2. Risk factor reduction:  Advised patient of need for regular exercise and diet rich and fruits and vegetables to reduce risk of heart attack and stroke. Exercise- started at gym in January- sleeping better. Diet-weight slight increase but still reasonable BMI- reasonable diet and balanced. Last year advised her to start exercising again - she  had been dealing with some low back pain and had advised core strengthening.  Wt Readings from Last 3 Encounters:  12/29/16 145 lb 6.4 oz (66 kg)  12/30/15 142 lb (64.4 kg)  08/13/15 141 lb (64 kg)  3. Immunizations/screenings/ancillary studies Immunization History  Administered Date(s) Administered  . Influenza-Unspecified 05/21/2014, 05/21/2015, 05/03/2016  . Td 08/08/2013  4. Cervical cancer screening- yearly pap smears with GYN due to history of cervical cancer and cold knife surgery 1997.  5. Breast cancer screening-  breast exam with GYN and mammogram 11/10/16 6. Colon cancer screening - no family history, start at age 47 7. Skin cancer screening- follows at least yearly with dermatology specialists of [place]. History of squamous cell and basal cell skin cancer.  8. IUD for birth control recently replaced  Status of chronic or acute concerns   History of low vitamin D- looked good on last check. Not takign supplement- update today  18 month CPE, reasonable  Orders Placed This Encounter  Procedures  . VITAMIN D 25 Hydroxy (Vit-D Deficiency, Fractures)    [place]   Return precautions advised.   Garret Reddish, MD

## 2017-01-11 ENCOUNTER — Encounter: Payer: Self-pay | Admitting: Family Medicine

## 2017-03-14 ENCOUNTER — Ambulatory Visit: Payer: 59

## 2017-08-23 LAB — HM PAP SMEAR: HM PAP: NEGATIVE

## 2017-08-28 ENCOUNTER — Encounter: Payer: Self-pay | Admitting: Family Medicine

## 2017-08-28 ENCOUNTER — Ambulatory Visit (INDEPENDENT_AMBULATORY_CARE_PROVIDER_SITE_OTHER): Payer: No Typology Code available for payment source | Admitting: Family Medicine

## 2017-08-28 VITALS — BP 116/76 | HR 90 | Temp 98.3°F | Ht 66.0 in | Wt 144.6 lb

## 2017-08-28 DIAGNOSIS — L219 Seborrheic dermatitis, unspecified: Secondary | ICD-10-CM | POA: Insufficient documentation

## 2017-08-28 DIAGNOSIS — Z8589 Personal history of malignant neoplasm of other organs and systems: Secondary | ICD-10-CM

## 2017-08-28 MED ORDER — CLOBETASOL PROPIONATE 0.05 % EX OINT: 1.0000 "application " | TOPICAL_OINTMENT | Freq: Two times a day (BID) | CUTANEOUS | 2 refills | Status: DC

## 2017-08-28 MED FILL — CLOBETASOL PROPIONATE 0.05: 0.05 | 40 days supply | Qty: 60 | Fill #0

## 2017-08-28 NOTE — Assessment & Plan Note (Signed)
S: for the last 6 months has had itching intermittently behind bilateral ears. Some mild discomfort - feels better when she uses brush on it or itches it short term. Has gotten bad enough that she has patches or potential plaques in area as well as cracked broken skin up to her ear. Has never had anything this  intesne before- mom colors her hair and may have noted some minor issues like this in the past but nothing like last 6 months. Has noted some flaking onto shoulders. also gets in left eyebrow at times slightly  Washes hair twice a week- uses twice a week head and shoulders prescription strength- was told may mess up hair color. Has also used t gel shampoo in past which helped some. Also oddly improved after intense pain with putting hair coloring near it (states has been much better since then).   A/P: Suspect seborrheic dermatitis per history- only minor findings on exam today. Discussed ketoconazole shampoo but she prefers a local topical treatment. Will trial temovate (high potency steroid) ointment up to 7 days with flares. Advised use head and shoulders at least once a week since it helps. For portion on her eyebrow- advised cortisone instead of high potency steroid.

## 2017-08-28 NOTE — Patient Instructions (Addendum)
For the patches behind the ears- use temovate twice a day for up to 7 days at first sign of this flaring up. Since it is calmed down right now may only need 2-3 days.   Can use this intermittently in the future.   May be worth using the head and shoulders at least once a week as long as your mom thinks it wont mess up your hair color.

## 2017-08-28 NOTE — Progress Notes (Signed)
Subjective:  Megan Mcmillan is a 42 y.o. year old very pleasant female patient who presents for/with See problem oriented charting ROS-not ill appearing, no fever/chills. No new medications. Not immunocompromised. No mucus membrane involvement.   Past Medical History-  Patient Active Problem List   Diagnosis Date Noted  . History of squamous cell carcinoma 08/19/2014    Priority: Medium  . Actinic keratosis 06/08/2015    Priority: Low  . Acromioclavicular sprain 04/29/2015    Priority: Low  . Cervical cancer ([place]) 08/19/2014    Priority: Low  . Vitamin D deficiency 08/19/2014    Priority: Low  . Former smoker 08/19/2014    Priority: Low  . Seborrheic dermatitis 08/28/2017   Medications- reviewed and updated Current Outpatient Medications  Medication Sig Dispense Refill  . bimatoprost (LATISSE) 0.03 % ophthalmic solution Place into both eyes at bedtime. Place one drop on applicator and apply evenly along the skin of the upper eyelid at base of eyelashes once daily at bedtime; repeat procedure for second eye (use a clean applicator).     Objective: BP 116/76 (BP Location: Left Arm, Patient Position: Sitting, Cuff Size: Large)   Pulse 90   Temp 98.3 F (36.8 C) (Oral)   Ht 5\' 6"  (1.676 m)   Wt 144 lb 9.6 oz (65.6 kg)   SpO2 99%   BMI 23.34 kg/m  Gen: NAD, resting comfortably CV: RRR Lungs: nonlabored Abdomen: healthy weight Ext: no edema Skin: warm, dry In hair just behind bilateral ears- very mild erythema- some flaking on the right side noted  Assessment/Plan:  Seborrheic dermatitis S: for the last 6 months has had itching intermittently behind bilateral ears. Some mild discomfort - feels better when she uses brush on it or itches it short term. Has gotten bad enough that she has patches or potential plaques in area as well as cracked broken skin up to her ear. Has never had anything this  intesne before- mom colors her hair and may have noted some minor issues like  this in the past but nothing like last 6 months. Has noted some flaking onto shoulders. also gets in left eyebrow at times slightly  Washes hair twice a week- uses twice a week head and shoulders prescription strength- was told may mess up hair color. Has also used t gel shampoo in past which helped some. Also oddly improved after intense pain with putting hair coloring near it (states has been much better since then).   A/P: Suspect seborrheic dermatitis per history- only minor findings on exam today. Discussed ketoconazole shampoo but she prefers a local topical treatment. Will trial temovate (high potency steroid) ointment up to 7 days with flares. Advised use head and shoulders at least once a week since it helps. For portion on her eyebrow- advised cortisone instead of high potency steroid.   Lab/Order associations: History of squamous cell carcinoma (with her insurance type she needs referral to all specialists) - Plan: Ambulatory referral to Dermatology  Meds ordered this encounter  Medications  . clobetasol ointment (TEMOVATE) 0.05 %    Sig: Apply 1 application topically 2 (two) times daily. Use for up to 7 days then need at least a 7 day break.    Dispense:  60 g    Refill:  2   Return precautions advised.  Garret Reddish, MD

## 2017-10-18 ENCOUNTER — Other Ambulatory Visit: Payer: Self-pay | Admitting: Family Medicine

## 2017-10-18 DIAGNOSIS — Z1231 Encounter for screening mammogram for malignant neoplasm of breast: Secondary | ICD-10-CM

## 2017-11-05 ENCOUNTER — Ambulatory Visit: Payer: No Typology Code available for payment source | Admitting: Family Medicine

## 2017-11-21 ENCOUNTER — Ambulatory Visit
Admission: RE | Admit: 2017-11-21 | Discharge: 2017-11-21 | Disposition: A | Discharge status: Home / Self Care | Payer: No Typology Code available for payment source | Source: Ambulatory Visit | Attending: Family Medicine | Admitting: Family Medicine

## 2017-11-21 DIAGNOSIS — Z1231 Encounter for screening mammogram for malignant neoplasm of breast: Secondary | ICD-10-CM

## 2017-11-21 LAB — HM MAMMOGRAPHY

## 2017-11-22 ENCOUNTER — Other Ambulatory Visit: Payer: Self-pay | Admitting: Family Medicine

## 2017-11-22 DIAGNOSIS — R928 Other abnormal and inconclusive findings on diagnostic imaging of breast: Secondary | ICD-10-CM

## 2017-11-27 ENCOUNTER — Ambulatory Visit
Admission: RE | Admit: 2017-11-27 | Discharge: 2017-11-27 | Disposition: A | Discharge status: Home / Self Care | Payer: No Typology Code available for payment source | Source: Ambulatory Visit | Attending: Family Medicine | Admitting: Family Medicine

## 2017-11-27 DIAGNOSIS — R928 Other abnormal and inconclusive findings on diagnostic imaging of breast: Secondary | ICD-10-CM

## 2017-11-28 ENCOUNTER — Encounter: Payer: Self-pay | Admitting: Family Medicine

## 2017-11-28 ENCOUNTER — Ambulatory Visit (INDEPENDENT_AMBULATORY_CARE_PROVIDER_SITE_OTHER): Payer: No Typology Code available for payment source | Admitting: Family Medicine

## 2017-11-28 VITALS — BP 108/68 | HR 83 | Temp 97.7°F | Ht 66.0 in | Wt 145.2 lb

## 2017-11-28 DIAGNOSIS — Z0000 Encounter for general adult medical examination without abnormal findings: Secondary | ICD-10-CM

## 2017-11-28 DIAGNOSIS — E559 Vitamin D deficiency, unspecified: Secondary | ICD-10-CM | POA: Diagnosis not present

## 2017-11-28 DIAGNOSIS — Z87891 Personal history of nicotine dependence: Secondary | ICD-10-CM

## 2017-11-28 DIAGNOSIS — R5383 Other fatigue: Secondary | ICD-10-CM | POA: Diagnosis not present

## 2017-11-28 MED ORDER — FLUTICASONE PROPIONATE 50 MCG/ACT NA SUSP: 2.0000 | Freq: Every day | NASAL | 3 refills | Status: DC

## 2017-11-28 MED FILL — FLUTICASONE PROP 50 MCG SPR: 50 | 30 days supply | Qty: 16 | Fill #0

## 2017-11-28 NOTE — Progress Notes (Signed)
Phone: [phone]  Subjective:  Patient presents today for their annual physical. Chief complaint-noted.   See problem oriented charting- ROS- full  review of systems was completed and negative except for:  a possible scratch on the inside of her ear  The following were reviewed and entered/updated in epic: Past Medical History:  Diagnosis Date  . Migraine 08/19/2014   No recurrence after birth of son. Previously on flexeril.     Patient Active Problem List   Diagnosis Date Noted  . History of squamous cell carcinoma 08/19/2014    Priority: Medium  . Actinic keratosis 06/08/2015    Priority: Low  . Acromioclavicular sprain 04/29/2015    Priority: Low  . Cervical cancer ([place]) 08/19/2014    Priority: Low  . Vitamin D deficiency 08/19/2014    Priority: Low  . Former smoker 08/19/2014    Priority: Low  . Seborrheic dermatitis 08/28/2017   Past Surgical History:  Procedure Laterality Date  . CERVICAL CONIZATION W/BX  1997   cold knife, no abnormal paps since.   Marland Kitchen VAGINAL DELIVERY  1998    Family History  Problem Relation Age of Onset  . Hypertension Mother   . Depression Mother        "depression in entire family"  . Heart attack Father 41       smoked marijuana for many years  . Hypertension Father   . Depression Father   . Breast cancer Maternal Grandmother        56s  . Diabetes Maternal Grandmother   . Dementia Maternal Grandmother     Medications- reviewed and updated Current Outpatient Medications  Medication Sig Dispense Refill  . bimatoprost (LATISSE) 0.03 % ophthalmic solution Place into both eyes at bedtime. Place one drop on applicator and apply evenly along the skin of the upper eyelid at base of eyelashes once daily at bedtime; repeat procedure for second eye (use a clean applicator).    . clobetasol ointment (TEMOVATE) 7.51 % Apply 1 application topically 2 (two) times daily. Use for up to 7 days then need at least a 7 day break. 60 g 2  .  fluticasone (FLONASE) 50 MCG/ACT nasal spray Place 2 sprays into both nostrils daily. 16 g 3   No current facility-administered medications for this visit.     Allergies-reviewed and updated Allergies  Allergen Reactions  . Latex     swelling    Social History   Social History Narrative   Married 2015. Son [id number] [name]- also patient of Dr. Yong Channel.     Finished HS.       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       Hobbies: football, reading, cooking, enjoys cleaning       Objective: BP 108/68 (BP Location: Left Arm, Patient Position: Sitting, Cuff Size: Normal)   Pulse 83   Temp 97.7 F (36.5 C) (Oral)   Ht 5\' 6"  (1.676 m)   Wt 145 lb 3.2 oz (65.9 kg)   SpO2 98%   BMI 23.44 kg/m  Gen: NAD, resting comfortably, fair skinned HEENT: Mucous membranes are moist. Oropharynx normal Neck: no thyromegaly CV: RRR no murmurs rubs or gallops Lungs: CTAB no crackles, wheeze, rhonchi Abdomen: soft/nontender/nondistended/normal bowel sounds.  Ext: no edema Skin: warm, dry Neuro: grossly normal, moves all extremities, PERRLA  Assessment/Plan:  42 y.o. female presenting for annual physical.  Health Maintenance counseling: 1. Anticipatory guidance: Patient counseled regarding regular dental exams -q6 months, eye exams -  reading glasses only- no changes in vision- has been seen within 3 years, wearing seatbelts.  2. Risk factor reduction:  Advised patient of need for regular exercise and diet rich and fruits and vegetables to reduce risk of heart attack and stroke. Doing some personal training. Eating reasonable diet- though slightly carb heavy.  Wt Readings from Last 3 Encounters:  11/28/17 145 lb 3.2 oz (65.9 kg)  08/28/17 144 lb 9.6 oz (65.6 kg)  12/29/16 145 lb 6.4 oz (66 kg)  3. Immunizations/screenings/ancillary studies- up to date.  Immunization History  Administered Date(s) Administered  . Influenza-Unspecified 05/21/2014, 05/21/2015,  05/03/2016, 04/30/2017  . Td 08/08/2013  4. Cervical cancer screening- Dr. [facility]. We are calling to get report 5. Breast cancer screening-  breast exam with GYN and mammogram with breast center- done recently and required ultrasound and diagnostic mammogram- luckily reassuring 6. Colon cancer screening - no family history, start at age 43 7. Skin cancer screening- sees [facility] now Dr. Gordy Clement to [place] with focus plan- multiple skin cancers in the past. advised regular sunscreen use. Denies worrisome, changing, or new skin lesions.  8. Birth control/STD check- IUD for birth control. monogamous   Status of chronic or acute concerns   Treated in January for more intense seborrheic dermatitis with temovate. Has only used once   Vitamin D- looked good on last check - taking multivitamin with D in it  Feels tired fair amount, hotter at night but not getting sheets weight. Stomach somewhat unsettled recently. BM once a day down from 3x a day. Much more sedentary with work and could affect frequency.  We agreed to update the labs.  Some allergies. otc tried and helps some. Wants to try flonase allergies could contribute to her fatigue.   Her lipids were normal in 2017, we will screen again 2022  Lab/Order associations: she will do her labs tomorrow Other fatigue - Plan: CBC, Comprehensive metabolic panel, TSH,   Vitamin D deficiency - Plan: VITAMIN D 25 Hydroxy (Vit-D Deficiency, Fractures)   Former smoker - Plan: POCT Urinalysis Dipstick (Automated)  Meds ordered this encounter  Medications  . fluticasone (FLONASE) 50 MCG/ACT nasal spray    Sig: Place 2 sprays into both nostrils daily.    Dispense:  16 g    Refill:  3    Return precautions advised.  Garret Reddish, MD

## 2017-11-28 NOTE — Patient Instructions (Addendum)
Health Maintenance Due  Topic Date Due  . PAP SMEAR -called to have report faxed 07/22/2017   Please do labs tomorrow at your office  I sent I flonase. No other changes today. Glad you are doing well

## 2017-11-29 ENCOUNTER — Encounter: Payer: Self-pay | Admitting: Family Medicine

## 2017-11-30 ENCOUNTER — Other Ambulatory Visit (INDEPENDENT_AMBULATORY_CARE_PROVIDER_SITE_OTHER): Payer: No Typology Code available for payment source

## 2017-11-30 DIAGNOSIS — Z87891 Personal history of nicotine dependence: Secondary | ICD-10-CM

## 2017-11-30 DIAGNOSIS — E559 Vitamin D deficiency, unspecified: Secondary | ICD-10-CM

## 2017-11-30 DIAGNOSIS — R5383 Other fatigue: Secondary | ICD-10-CM | POA: Diagnosis not present

## 2017-11-30 LAB — POC URINALSYSI DIPSTICK (AUTOMATED)
BILIRUBIN UA: NEGATIVE
Blood, UA: NEGATIVE
GLUCOSE UA: NEGATIVE
KETONES UA: NEGATIVE
LEUKOCYTES UA: NEGATIVE
NITRITE UA: NEGATIVE
Protein, UA: NEGATIVE
Spec Grav, UA: 1.015 (ref 1.010–1.025)
Urobilinogen, UA: 0.2 E.U./dL
pH, UA: 7 (ref 5.0–8.0)

## 2017-12-01 LAB — CBC WITH DIFFERENTIAL/PLATELET
Basophils Absolute: 0 10*3/uL (ref 0.0–0.2)
Basos: 0 %
EOS (ABSOLUTE): 0 10*3/uL (ref 0.0–0.4)
EOS: 0 %
HEMATOCRIT: 40.7 % (ref 34.0–46.6)
HEMOGLOBIN: 13.5 g/dL (ref 11.1–15.9)
IMMATURE GRANULOCYTES: 0 %
Immature Grans (Abs): 0 10*3/uL (ref 0.0–0.1)
LYMPHS ABS: 1.7 10*3/uL (ref 0.7–3.1)
Lymphs: 24 %
MCH: 32.6 pg (ref 26.6–33.0)
MCHC: 33.2 g/dL (ref 31.5–35.7)
MCV: 98 fL — ABNORMAL HIGH (ref 79–97)
MONOCYTES: 9 %
Monocytes Absolute: 0.6 10*3/uL (ref 0.1–0.9)
Neutrophils Absolute: 4.8 10*3/uL (ref 1.4–7.0)
Neutrophils: 67 %
Platelets: 282 10*3/uL (ref 150–379)
RBC: 4.14 x10E6/uL (ref 3.77–5.28)
RDW: 13.4 % (ref 12.3–15.4)
WBC: 7.2 10*3/uL (ref 3.4–10.8)

## 2017-12-01 LAB — COMPREHENSIVE METABOLIC PANEL
ALT: 27 IU/L (ref 0–32)
AST: 25 IU/L (ref 0–40)
Albumin/Globulin Ratio: 1.7 (ref 1.2–2.2)
Albumin: 4.4 g/dL (ref 3.5–5.5)
Alkaline Phosphatase: 55 IU/L (ref 39–117)
BUN/Creatinine Ratio: 17 (ref 9–23)
BUN: 13 mg/dL (ref 6–24)
Bilirubin Total: 0.4 mg/dL (ref 0.0–1.2)
CO2: 23 mmol/L (ref 20–29)
CREATININE: 0.75 mg/dL (ref 0.57–1.00)
Calcium: 9.6 mg/dL (ref 8.7–10.2)
Chloride: 102 mmol/L (ref 96–106)
GFR, EST AFRICAN AMERICAN: 113 mL/min/{1.73_m2} (ref >59)
GFR, EST NON AFRICAN AMERICAN: 98 mL/min/{1.73_m2} (ref >59)
GLOBULIN, TOTAL: 2.6 g/dL (ref 1.5–4.5)
Glucose: 90 mg/dL (ref 65–99)
Potassium: 4.3 mmol/L (ref 3.5–5.2)
SODIUM: 142 mmol/L (ref 134–144)
TOTAL PROTEIN: 7 g/dL (ref 6.0–8.5)

## 2017-12-01 LAB — TSH: TSH: 1.4 u[IU]/mL (ref 0.450–4.500)

## 2017-12-01 LAB — VITAMIN D 25 HYDROXY (VIT D DEFICIENCY, FRACTURES): Vit D, 25-Hydroxy: 43 ng/mL (ref 30.0–100.0)

## 2017-12-03 NOTE — Progress Notes (Signed)
Please let us know by responding to this when you get this mychart message and let us know if you understand the results/directions  Your CBC was largely  normal (blood counts, infection fighting cells, platelets).  Your MCV was slightly high which means your red blood cells are slightly large.  This can be caused by a number of things-such as slightly low B12 or folate.  You could consider a daily multivitamin if not already using.  Other causes which I don't believe are a concern for you are smoking and overuse of alcohol (average 1 alcoholic beverage per day for women is ok) .  Your CMET was normal (kidney, liver, and electrolytes, blood sugar).  Your vitamin D looks great Your thyroid was normal.

## 2017-12-25 ENCOUNTER — Telehealth: Payer: No Typology Code available for payment source | Admitting: Family

## 2017-12-25 DIAGNOSIS — B9689 Other specified bacterial agents as the cause of diseases classified elsewhere: Secondary | ICD-10-CM | POA: Diagnosis not present

## 2017-12-25 DIAGNOSIS — J019 Acute sinusitis, unspecified: Secondary | ICD-10-CM | POA: Diagnosis not present

## 2017-12-25 MED ORDER — AMOXICILLIN-POT CLAVULANATE 875-125 MG PO TABS: 1.0000 | ORAL_TABLET | Freq: Two times a day (BID) | ORAL | 0 refills | Status: DC

## 2017-12-25 MED FILL — AMOX-CLAV 875-125 MG TABLET: 875-125 | 7 days supply | Qty: 14 | Fill #0

## 2017-12-25 NOTE — Progress Notes (Signed)

## 2017-12-28 ENCOUNTER — Other Ambulatory Visit: Payer: Self-pay

## 2017-12-28 MED ORDER — FLUCONAZOLE 150 MG PO TABS: 150.0000 mg | ORAL_TABLET | Freq: Once | ORAL | 0 refills | Status: DC

## 2017-12-28 MED FILL — FLUCONAZOLE 150 MG TABS: 150 | 1 days supply | Qty: 1 | Fill #0

## 2018-01-08 ENCOUNTER — Telehealth: Payer: No Typology Code available for payment source | Admitting: Physician Assistant

## 2018-01-08 DIAGNOSIS — J019 Acute sinusitis, unspecified: Secondary | ICD-10-CM

## 2018-01-08 DIAGNOSIS — B9689 Other specified bacterial agents as the cause of diseases classified elsewhere: Secondary | ICD-10-CM | POA: Diagnosis not present

## 2018-01-08 MED ORDER — DOXYCYCLINE HYCLATE 100 MG PO CAPS: 100.0000 mg | ORAL_CAPSULE | Freq: Two times a day (BID) | ORAL | 0 refills | Status: AC

## 2018-01-08 NOTE — Progress Notes (Signed)

## 2018-03-13 ENCOUNTER — Ambulatory Visit: Payer: No Typology Code available for payment source | Admitting: Family Medicine

## 2018-03-21 ENCOUNTER — Encounter: Payer: Self-pay | Admitting: Family Medicine

## 2018-03-22 ENCOUNTER — Other Ambulatory Visit: Payer: Self-pay

## 2018-03-22 DIAGNOSIS — R29898 Other symptoms and signs involving the musculoskeletal system: Secondary | ICD-10-CM

## 2018-03-25 NOTE — Progress Notes (Signed)
[facility] [place] [place], [place] [address] Phone: [phone] Subjective:    CC: back pain   BWL:[id number]  Megan Mcmillan is a 42 y.o. female coming in with complaint of back pain. She has been having pain on the lower left side but is here as a Physiological scientist told her that her right hip is weak. She feels like she has had right hip pain deep into the joint. She has been on the 50,000 IU of vitamin d which helps her pain. She has not experienced any giving out or weakness herself.      Past Medical History:  Diagnosis Date  . Migraine 08/19/2014   No recurrence after birth of son. Previously on flexeril.     Past Surgical History:  Procedure Laterality Date  . CERVICAL CONIZATION W/BX  1997   cold knife, no abnormal paps since.   Marland Kitchen VAGINAL DELIVERY  1998   Social History   Socioeconomic History  . Marital status: Married    Spouse name: Not on file  . Number of children: Not on file  . Years of education: Not on file  . Highest education level: Not on file  Occupational History  . Not on file  Social Needs  . Financial resource strain: Not on file  . Food insecurity:    Worry: Not on file    Inability: Not on file  . Transportation needs:    Medical: Not on file    Non-medical: Not on file  Tobacco Use  . Smoking status: Former Smoker    Packs/day: 1.00    Years: 21.00    Pack years: 21.00    Types: Cigarettes    Last attempt to quit: 11/06/2010    Years since quitting: 7.3  . Smokeless tobacco: Never Used  Substance and Sexual Activity  . Alcohol use: Yes    Alcohol/week: 5.0 standard drinks    Types: 5 Standard drinks or equivalent per week  . Drug use: No  . Sexual activity: Not on file  Lifestyle  . Physical activity:    Days per week: Not on file    Minutes per session: Not on file  . Stress: Not on file  Relationships  . Social connections:    Talks on phone: Not on file    Gets together: Not on file   Attends religious service: Not on file    Active member of club or organization: Not on file    Attends meetings of clubs or organizations: Not on file    Relationship status: Not on file  Other Topics Concern  . Not on file  Social History Narrative   Married 2015. Son [id number] [name]- also patient of Dr. Yong Channel.     Finished HS.       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       Hobbies: football, reading, cooking, enjoys cleaning      Allergies  Allergen Reactions  . Latex     swelling   Family History  Problem Relation Age of Onset  . Hypertension Mother   . Depression Mother        "depression in entire family"  . Heart attack Father 41       smoked marijuana for many years  . Hypertension Father   . Depression Father   . Breast cancer Maternal Grandmother        67s  . Diabetes Maternal Grandmother   .  Dementia Maternal Grandmother      Past medical history, social, surgical and family history all reviewed in electronic medical record.  No pertanent information unless stated regarding to the chief complaint.   Review of Systems:Review of systems updated and as accurate as of 03/28/18  No headache, visual changes, nausea, vomiting, diarrhea, constipation, dizziness, abdominal pain, skin rash, fevers, chills, night sweats, weight loss, swollen lymph nodes, body aches, joint swelling,  chest pain, shortness of breath, mood changes.  Positive muscle aches  Objective  Blood pressure 100/70, pulse 90, height 5\' 6"  (1.676 m), weight 146 lb (66.2 kg), SpO2 98 %. Systems examined below as of 03/28/18   General: No apparent distress alert and oriented x3 mood and affect normal, dressed appropriately.  HEENT: Pupils equal, extraocular movements intact  Respiratory: Patient's speak in full sentences and does not appear short of breath  Cardiovascular: No lower extremity edema, non tender, no erythema  Skin: Warm dry intact with no signs of  infection or rash on extremities or on axial skeleton.  Abdomen: Soft nontender  Neuro: Cranial nerves II through XII are intact, neurovascularly intact in all extremities with 2+ DTRs and 2+ pulses.  Lymph: No lymphadenopathy of posterior or anterior cervical chain or axillae bilaterally.  Gait normal with good balance and coordination.  MSK:  Non tender with full range of motion and good stability and symmetric strength and tone of shoulders, elbows, wrist,  knee and ankles bilaterally.  Hip: Right ROM IR: 45 Deg, ER: 25 Deg, Flexion: 120 Deg, Extension: 100 Deg, Abduction: 45 Deg, Adduction: 45 Deg Strength IR: 5/5, ER: 5/5, Flexion: 5/5, Extension: 5/5, Abduction: 4/5 but symmetric, Adduction: 4/5 Pelvic alignment unremarkable to inspection and palpation. Standing hip rotation and gait without trendelenburg sign / unsteadiness. Mild weakness on the right Right SI joint   Osteopathic findings T3 extended rotated and side bent right inhaled third rib T9 extended rotated and side bent left L2 flexed rotated and side bent right Sacrum right on right     Impression and Recommendations:     This case required medical decision making of moderate complexity.      Note: This dictation was prepared with Dragon dictation along with smaller phrase technology. Any transcriptional errors that result from this process are unintentional.

## 2018-03-28 ENCOUNTER — Ambulatory Visit: Payer: No Typology Code available for payment source | Admitting: Family Medicine

## 2018-03-28 ENCOUNTER — Encounter: Payer: Self-pay | Admitting: Family Medicine

## 2018-03-28 VITALS — BP 100/70 | HR 90 | Ht 66.0 in | Wt 146.0 lb

## 2018-03-28 DIAGNOSIS — R29898 Other symptoms and signs involving the musculoskeletal system: Secondary | ICD-10-CM

## 2018-03-28 DIAGNOSIS — M999 Biomechanical lesion, unspecified: Secondary | ICD-10-CM

## 2018-03-28 MED ORDER — VITAMIN D (ERGOCALCIFEROL) 1.25 MG (50000 UNIT) PO CAPS: 50000.0000 [IU] | ORAL_CAPSULE | ORAL | 0 refills | Status: DC

## 2018-03-28 MED FILL — VIT D2 1.25 MG (50,000 UNIT: 1.25 MG | 84 days supply | Qty: 12 | Fill #0

## 2018-03-28 NOTE — Assessment & Plan Note (Signed)
Weakness in the right hip.  Responded well to osteopathic manipulation.  Discussed which activities to do which wants to avoid.  Discussed which activities to do which wants to avoid.  Patient seems to have more than possibly include injury.  Follow-up again in 4 weeks

## 2018-03-28 NOTE — Assessment & Plan Note (Signed)
Decision today to treat with OMT was based on Physical Exam  After verbal consent patient was treated with HVLA, ME, FPR techniques in cervical, thoracic, lumbar and sacral areas  Patient tolerated the procedure well with improvement in symptoms  Patient given exercises, stretches and lifestyle modifications  See medications in patient instructions if given  Patient will follow up in 4-6 weeks 

## 2018-03-28 NOTE — Patient Instructions (Addendum)
Good to see you  Hip abductors and adductors are a little weak Ice 20 minutes 2 times daily. Usually after activity and before bed. Exercises 3 times a week.  Once weekly vitamin D for 12 weeks.  Stay active Eat within 30 minutes of working out.  See me again in 4-6 weeks if the manipulation helped or you want to show off how strong you are

## 2018-04-04 ENCOUNTER — Encounter: Payer: Self-pay | Admitting: Family Medicine

## 2018-04-05 MED ORDER — VITAMIN D (ERGOCALCIFEROL) 1.25 MG (50000 UNIT) PO CAPS: 50000.0000 [IU] | ORAL_CAPSULE | ORAL | 0 refills | Status: DC

## 2018-04-11 ENCOUNTER — Other Ambulatory Visit: Payer: Self-pay | Admitting: *Deleted

## 2018-04-11 MED ORDER — VITAMIN D (ERGOCALCIFEROL) 1.25 MG (50000 UNIT) PO CAPS: 50000.0000 [IU] | ORAL_CAPSULE | ORAL | 0 refills | Status: DC

## 2018-04-15 MED FILL — VIT D2 1.25 MG (50,000 UNIT: 1.25 MG | 84 days supply | Qty: 12 | Fill #0

## 2018-05-09 ENCOUNTER — Ambulatory Visit: Payer: No Typology Code available for payment source | Admitting: Family Medicine

## 2018-05-31 ENCOUNTER — Encounter: Payer: Self-pay | Admitting: Family Medicine

## 2018-05-31 ENCOUNTER — Ambulatory Visit (INDEPENDENT_AMBULATORY_CARE_PROVIDER_SITE_OTHER): Payer: No Typology Code available for payment source | Admitting: Family Medicine

## 2018-05-31 VITALS — BP 125/75 | HR 78 | Temp 97.7°F | Resp 20 | Ht 66.0 in | Wt 143.5 lb

## 2018-05-31 DIAGNOSIS — M779 Enthesopathy, unspecified: Secondary | ICD-10-CM

## 2018-05-31 DIAGNOSIS — M7021 Olecranon bursitis, right elbow: Secondary | ICD-10-CM | POA: Diagnosis not present

## 2018-05-31 MED ORDER — KETOROLAC TROMETHAMINE 60 MG/2ML IM SOLN
60.0000 mg | Freq: Once | INTRAMUSCULAR | Status: AC
  Administered 2018-05-31: 60 mg via INTRAMUSCULAR

## 2018-05-31 MED ORDER — NAPROXEN 500 MG PO TABS: 500.0000 mg | ORAL_TABLET | Freq: Two times a day (BID) | ORAL | 0 refills | Status: DC

## 2018-05-31 MED FILL — NAPROXEN 500 MG TABLET: 500 | 15 days supply | Qty: 30 | Fill #0

## 2018-05-31 NOTE — Patient Instructions (Addendum)
Elbow Bursitis A bursa is a fluid-filled sac that covers and protects a joint. Bursitis is when the fluid-filled sac gets puffy and sore (inflamed). Elbow bursitis, also called olecranon bursitis, happens over your elbow. This may be caused by:  Injury (acute trauma) to your elbow.  Leaning on hard surfaces for long periods of time.  Infection from an injury that breaks the skin near your elbow.  A bone growth (spur) that forms at the tip of your elbow.  A medical condition that causes inflammation in your body, such as: ? Gout. ? Rheumatoid arthritis.  Sometimes the cause is not known. Follow these instructions at home:  Take medicines only as told by your doctor.  If you were prescribed an antibiotic medicine, finish all of it even if you start to feel better.  If your bursitis is caused by an injury, rest your elbow and wear your bandage as told by your doctor. You may also apply ice to the injured area as told by your doctor: ? Put ice in a plastic bag. ? Place a towel between your skin and the bag. ? Leave the ice on for 20 minutes, 2-3 times per day.  Do not do any activities that cause pain to your elbow.  Use elbow pads or wraps to cushion your elbow. Contact a doctor if:  You have a fever.  Your symptoms do not get better with treatment.  Your pain or swelling gets worse.  Your pain or swelling goes away and then comes back.  You have drainage of pus from the swollen area over your elbow. This information is not intended to replace advice given to you by your health care provider. Make sure you discuss any questions you have with your health care provider. Document Released: 01/11/2010 Document Revised: 12/30/2015 Document Reviewed: 04/01/2014 Elsevier Interactive Patient Education  2018 [employer].   Acute Compartment Syndrome Compartment syndrome is a painful condition that occurs when swelling and pressure build up in a body space (compartment) of the  arms or legs. Groups of muscles, nerves, and blood vessels in the arms and legs are separated into various compartments. Each compartment is surrounded by tough layers of tissue (fascia). In compartment syndrome, pressure builds up within the layers of fascia and begins to push on the structures within that compartment. In acute compartment syndrome, the pressure builds up suddenly, often as the result of an injury. If pressure continues to increase, it can block the flow of blood in the smallest blood vessels (capillaries). Then the muscles in the compartment cannot get enough oxygen and nutrients and will start to die within 4-6 hours. The nerves will begin to die within 12-24 hours. This condition is a medical emergency that must be treated with surgery. What are the causes? This condition may be caused by:  Injury. Some injuries can cause swelling or bleeding in a compartment. This can lead to compartment syndrome. Injuries that may cause this problem include: ? Broken bones, especially the long bones of the arms and legs. ? Crushing injuries. ? Penetrating injuries, such as a knife wound. ? Badly bruised muscles. ? Poisonous bites, such as a snake bite. ? Severe burns.  Blocked blood flow. This could be a result of: ? A cast or bandage that is too tight. ? A surgical procedure. Blood flow sometimes has to be stopped for a while during a surgery, usually with a tourniquet. ? Lying for too long in a position that restricts blood flow. This can  happen in people who have nerve damage or if a person is unconscious for a long time. ? Medicines used to build up muscles (anabolic steroids). ? Medicines that keep the blood from forming clots (blood thinners).  What are the signs or symptoms? The most common symptom of this condition is pain. The pain:  May be far more severe than it should be for the injury you have.  May get worse: ? When moving or stretching the affected body part. ? When  the area is pushed or squeezed. ? When raising (elevating) affected body part above the level of the heart.  May come with a feeling of tingling or burning.  May not get better when you take pain medicine.  Other symptoms include:  A feeling of tightness or fullness in the affected area.  A loss of feeling.  Weakness in the area.  Loss of movement.  Skin becoming pale, tight, and shiny over the painful area.  Warmth and tenderness.  Tensing when the affected area is touched.  How is this diagnosed? This condition may be diagnosed based on:  Your physical exam and symptoms.  Measuring the pressure in the affected area (compartment pressure measurement).  Tests to rule out other problems, such as: ? X-rays. ? Blood tests. ? Ultrasound.  How is this treated? Treatment for this condition uses a procedure called fasciotomy. In this procedure, incisions are made through the fascia to relieve the pressure in the compartment and to prevent permanent damage. Before the surgery, first-aid treatment is done, which may include:  Treating any injury.  Loosening or removing any cast, bandage, or external wrap that may be causing pain.  Elevating the painful arm or leg to the same level as the heart.  Giving oxygen.  Giving fluids through an IV tube.  Pain medicine.  Summary  Compartment syndrome occurs when swelling and pressure build up in a body space (compartment) of the arms or legs.  First aid treatment may include loosening or removing a cast, bandage, or wrap and elevating the painful arm or leg at the level of the heart.  In acute compartment syndrome, the pressure builds up suddenly, often as the result of an injury.  This condition is a medical emergency that must be treated with a surgical procedure called fasciotomy. This procedure relieves the pressure and prevents permanent damage. This information is not intended to replace advice given to you by your  health care provider. Make sure you discuss any questions you have with your health care provider. Document Released: 07/12/2009 Document Revised: 07/13/2016 Document Reviewed: 07/13/2016 Elsevier Interactive Patient Education  2017 [employer].

## 2018-05-31 NOTE — Progress Notes (Signed)
Megan Mcmillan , 16-May-1976, 42 y.o., female MRN: [id number] Patient Care Team    Relationship Specialty Notifications Start End  Marin Olp, MD PCP - General Family Medicine  08/19/14    Comment: Merged    Chief Complaint  Patient presents with  . Joint Swelling    right     Subjective: Pt presents for an OV with complaints of with swelling and discomfort right arm of 1 day duration.  Associated symptoms include onset yesterday with mild swelling over right elbow. She did not notice it much until it was pointed out to her by a coworker. Yesterday evening she did place ice over area, which she felt did help with discomfort. This morning she noticed her arm was rather swollen and "tight". She endorses having a rather intense upper body work out the day prior to onset. She denies fever, chills, nausea, redness, known injury to arm or elbow or skin break. She denies numbness or tingling distal of swelling.   Depression screen [facility] 2/9 05/31/2018 08/28/2017 12/30/2015  Decreased Interest 0 0 0  Down, Depressed, Hopeless 0 0 0  PHQ - 2 Score 0 0 0    Allergies  Allergen Reactions  . Latex     swelling   Social History   Tobacco Use  . Smoking status: Former Smoker    Packs/day: 1.00    Years: 21.00    Pack years: 21.00    Types: Cigarettes    Last attempt to quit: 11/06/2010    Years since quitting: 7.5  . Smokeless tobacco: Never Used  Substance Use Topics  . Alcohol use: Yes    Alcohol/week: 5.0 standard drinks    Types: 5 Standard drinks or equivalent per week   Past Medical History:  Diagnosis Date  . Migraine 08/19/2014   No recurrence after birth of son. Previously on flexeril.     Past Surgical History:  Procedure Laterality Date  . CERVICAL CONIZATION W/BX  1997   cold knife, no abnormal paps since.   Marland Kitchen VAGINAL DELIVERY  1998   Family History  Problem Relation Age of Onset  . Hypertension Mother   . Depression Mother        "depression in entire  family"  . Heart attack Father 41       smoked marijuana for many years  . Hypertension Father   . Depression Father   . Breast cancer Maternal Grandmother        63s  . Diabetes Maternal Grandmother   . Dementia Maternal Grandmother    Allergies as of 05/31/2018      Reactions   Latex    swelling      Medication List        Accurate as of 05/31/18 12:01 PM. Always use your most recent med list.          bimatoprost 0.03 % ophthalmic solution Commonly known as:  LATISSE Place into both eyes at bedtime. Place one drop on applicator and apply evenly along the skin of the upper eyelid at base of eyelashes once daily at bedtime; repeat procedure for second eye (use a clean applicator).   clobetasol ointment 0.05 % Commonly known as:  TEMOVATE Apply 1 application topically 2 (two) times daily. Use for up to 7 days then need at least a 7 day break.   fluticasone 50 MCG/ACT nasal spray Commonly known as:  FLONASE Place 2 sprays into both nostrils daily.   naproxen 500 MG  tablet Commonly known as:  NAPROSYN Take 1 tablet (500 mg total) by mouth 2 (two) times daily with a meal.   Vitamin D (Ergocalciferol) 50000 units Caps capsule Commonly known as:  DRISDOL Take 1 capsule (50,000 Units total) by mouth every 7 (seven) days.       All past medical history, surgical history, allergies, family history, immunizations andmedications were updated in the EMR today and reviewed under the history and medication portions of their EMR.     ROS: Negative, with the exception of above mentioned in HPI   Objective:  BP 125/75 (BP Location: Left Arm, Patient Position: Sitting, Cuff Size: Normal)   Pulse 78   Temp 97.7 F (36.5 C)   Resp 20   Ht 5\' 6"  (1.676 m)   Wt 143 lb 8 oz (65.1 kg)   SpO2 100%   BMI 23.16 kg/m  Body mass index is 23.16 kg/m. Gen: Afebrile. No acute distress. Nontoxic in appearance, well developed, well nourished.  MSK (right arm): No erythema, moderate  soft tissue swelling forearm, mildly taut. Mild swelling over olecranon  bursa. No TTP olecranon bursa. Full ROM  extension and flexion. Mild discomfort with full flexion. Pronation/supination mildly uncomfortable. + radial and ulnar pulses. Normal cap refill fingers. NV intact distally.  Skin: No rashes, purpura or petechiae. Skin intact, no erythema.  Neuro: Normal gait. PERLA. EOMi. Alert. Oriented x3  No exam data present No results found. No results found for this or any previous visit (from the past 24 hour(s)).  Assessment/Plan: Megan Mcmillan is a 42 y.o. female present for OV for  Olecranon bursitis of right elbow/Tendonitis - Suspect rather severe tendonitis as cause s/p intense work out.  Swelling over forearm to elbow. Fluid is appreciated over elbow, but no tenderness over bursa, ROM intact. No erythema. Skin intact. VSS. Do not suspect infection.  - HYDRATE.  - Avoid upper body work out for 1 week, longer if symptoms still present.  - Toradol injection (60 IM) today. Start naproxen every 12 hours tomorrow with food.  - rest, compression sleeve daily, ice.  - compartment syndrome info provided to patient and she was instructed to seek immediate attention if experiences symptoms. She voiced understanding.  - f/u 5-7 days PRN  Reviewed expectations re: course of current medical issues.  Discussed self-management of symptoms.  Outlined signs and symptoms indicating need for more acute intervention.  Patient verbalized understanding and all questions were answered.  Patient received an After-Visit Summary.    No orders of the defined types were placed in this encounter.    Note is dictated utilizing voice recognition software. Although note has been proof read prior to signing, occasional typographical errors still can be missed. If any questions arise, please do not hesitate to call for verification.   electronically signed by:  Howard Pouch, DO  [place]

## 2018-06-18 ENCOUNTER — Ambulatory Visit (INDEPENDENT_AMBULATORY_CARE_PROVIDER_SITE_OTHER): Payer: No Typology Code available for payment source | Admitting: Family Medicine

## 2018-06-18 ENCOUNTER — Encounter: Payer: Self-pay | Admitting: Family Medicine

## 2018-06-18 VITALS — BP 98/74 | HR 86 | Temp 98.0°F | Ht 66.0 in | Wt 144.8 lb

## 2018-06-18 DIAGNOSIS — J029 Acute pharyngitis, unspecified: Secondary | ICD-10-CM | POA: Diagnosis not present

## 2018-06-18 DIAGNOSIS — J069 Acute upper respiratory infection, unspecified: Secondary | ICD-10-CM

## 2018-06-18 LAB — POCT RAPID STREP A (OFFICE): RAPID STREP A SCREEN: NEGATIVE

## 2018-06-18 NOTE — Patient Instructions (Addendum)
I think this is a bad upper respiratory infection  Best thing to do for this 1. Try to get 1-2 extra hours sleep at night and then a nap in daytime if possible.  2. Stay well hydrated (keep up the good job on this) 3. Take it easy at work if at all possible- mental stress can prolong physical illness 4. I do think trial of codeine cough syrup around 9 PM would be reasonable to see if we can get you more sleep 5. Sore throat lozenges or honey can be used as well as salt water gargles for sore throat portion  If sinuses start to get irritated, symptoms last over 10 days without improving, you have fever or shortness of breath- I want to know ASAP please

## 2018-06-18 NOTE — Progress Notes (Signed)
PCP: Marin Olp, MD  Subjective:  Megan Mcmillan is a 42 y.o. year old very pleasant female patient who presents with Upper Respiratory infection symptoms including nasal congestion, sore throat, cough   Woke up Saturday morning with scratchy throat and then by Sunday throat was very sore- actually woke up at 2 AM.  Started sounding nasal by evening on Sunday. Started sudafed Sunday afternoon. Woke up Monday morning at 2 15- felt very dry. Woke up at 1 AM today. On sudafed has to drink a lot of water as feels extremely dry. Productive cough starting last night- clear for now. Nasal congestion - has used afrin. No shortness of breath. Right ear pressure starting today. She was worried as felt like symptoms were worsening rather quickly. No severe sinus pressure as she has had in past with sinus infections.  -sick contacts/travel/risks: denies flu exposure. Does work in medical office  ROS-denies fever, SOB, NVD, tooth pain/severe sinus pain  Pertinent Past Medical History-  Patient Active Problem List   Diagnosis Date Noted  . History of squamous cell carcinoma 08/19/2014    Priority: Medium  . Actinic keratosis 06/08/2015    Priority: Low  . Acromioclavicular sprain 04/29/2015    Priority: Low  . Cervical cancer ([place]) 08/19/2014    Priority: Low  . Vitamin D deficiency 08/19/2014    Priority: Low  . Former smoker 08/19/2014    Priority: Low  . Tendonitis 05/31/2018  . Olecranon bursitis of right elbow 05/31/2018  . Weakness of right hip 03/28/2018  . Nonallopathic lesion of thoracic region 03/28/2018  . Nonallopathic lesion of lumbosacral region 03/28/2018  . Nonallopathic lesion of sacral region 03/28/2018  . Seborrheic dermatitis 08/28/2017    Medications- reviewed  Current Outpatient Medications  Medication Sig Dispense Refill  . bimatoprost (LATISSE) 0.03 % ophthalmic solution Place into both eyes at bedtime. Place one drop on applicator and apply evenly along the  skin of the upper eyelid at base of eyelashes once daily at bedtime; repeat procedure for second eye (use a clean applicator).    . clobetasol ointment (TEMOVATE) 7.26 % Apply 1 application topically 2 (two) times daily. Use for up to 7 days then need at least a 7 day break. 60 g 2  . fluticasone (FLONASE) 50 MCG/ACT nasal spray Place 2 sprays into both nostrils daily. 16 g 3  . Vitamin D, Ergocalciferol, (DRISDOL) 50000 units CAPS capsule Take 1 capsule (50,000 Units total) by mouth every 7 (seven) days. 12 capsule 0   Objective: BP 98/74 (BP Location: Left Arm, Patient Position: Sitting, Cuff Size: Large)   Pulse 86   Temp 98 F (36.7 C) (Oral)   Ht 5\' 6"  (1.676 m)   Wt 144 lb 12.8 oz (65.7 kg)   SpO2 99%   BMI 23.37 kg/m  Gen: NAD, resting comfortably HEENT: Turbinates erythematous, TM normal, pharynx moderately erythematous with no tonsilar exudate - there is a small amount of white discharge over left tonsil. no sinus tenderness CV: RRR no murmurs rubs or gallops Lungs: CTAB no crackles, wheeze, rhonchi Ext: no edema Skin: warm, dry, no rash Neuro: Nasal sounding voice but otherwise speech normal, moves all extremities  Results for orders placed or performed in visit on 06/18/18 (from the past 24 hour(s))  POCT rapid strep A     Status: None   Collection Time: 06/18/18  4:57 PM  Result Value Ref Range   Rapid Strep A Screen Negative Negative   Assessment/Plan:  Upper  Respiratory infection History and exam today are suggestive of viral infection most likely due to upper respiratory infection. Symptomatic treatment with: Can use Sudafed and daytime.  Seems to be keeping her up at night-advised codeine cough syrup with Mucinex which she already actually has at home.  She is tends to be sensitive to the codeine so advised her to trial at around 9 PM the night.  Please also see recommendations per after visit summary.  Due to some exudate on left tonsil did strep testing which was  negative  We discussed that we did not find any infection that had higher probability of being bacterial such as pneumonia or strep throat. We discussed signs that bacterial infection may have developed particularly fever or shortness of breath. Likely course of 1-2 weeks.  Finally, we reviewed reasons to return to care including if symptoms worsen or persist or new concerns arise- once again particularly shortness of breath or fever.  Garret Reddish, MD

## 2018-06-24 ENCOUNTER — Encounter: Payer: Self-pay | Admitting: Family Medicine

## 2018-06-26 ENCOUNTER — Encounter: Payer: Self-pay | Admitting: Family Medicine

## 2018-06-26 MED ORDER — AMOXICILLIN-POT CLAVULANATE 875-125 MG PO TABS: 1.0000 | ORAL_TABLET | Freq: Two times a day (BID) | ORAL | 0 refills | Status: AC

## 2018-06-26 MED ORDER — FLUCONAZOLE 150 MG PO TABS: 150.0000 mg | ORAL_TABLET | Freq: Once | ORAL | 0 refills | Status: AC

## 2018-06-26 MED FILL — FLUCONAZOLE 150 MG TABS: 150 | 1 days supply | Qty: 1 | Fill #0

## 2018-06-26 MED FILL — AMOX-CLAV 875-125 MG TABLET: 875-125 | 7 days supply | Qty: 14 | Fill #0

## 2018-06-28 ENCOUNTER — Encounter: Payer: Self-pay | Admitting: Family Medicine

## 2018-07-09 ENCOUNTER — Ambulatory Visit (INDEPENDENT_AMBULATORY_CARE_PROVIDER_SITE_OTHER): Payer: Self-pay | Admitting: Plastic Surgery

## 2018-07-09 ENCOUNTER — Encounter: Payer: Self-pay | Admitting: Plastic Surgery

## 2018-07-09 DIAGNOSIS — Z719 Counseling, unspecified: Secondary | ICD-10-CM

## 2018-07-09 NOTE — Progress Notes (Signed)
   Subjective:    Patient ID: Megan Mcmillan, female    DOB: May 03, 1976, 42 y.o.   MRN: [id number]  The patient is a pleasant 42 year old female who is here for consultation for facial rejuvenation.  She has had Botox in the past where some improvement but still has forehead wrinkling even a month after the treatment.  She has good elevation of the brows.  There is loss of the midface volume.  She has some wrinkling in the perioral area but as expected for her age.  She seems to take good care of her skin.  She has a history of smoking but has been tobacco free for many years.  She is interested in Botox treatment and fillers.     Review of Systems  Constitutional: Negative.   HENT: Negative.   Eyes: Negative.   Respiratory: Negative.   Cardiovascular: Negative.   Endocrine: Negative.   Genitourinary: Negative.   Musculoskeletal: Negative.   Skin: Negative.   Neurological: Negative.   Hematological: Negative.   Psychiatric/Behavioral: Negative.       Objective:   Physical Exam  Constitutional: She is oriented to person, place, and time. She appears well-developed and well-nourished.  HENT:  Head: Normocephalic and atraumatic.  Neurological: She is alert and oriented to person, place, and time.  Skin: Skin is warm.  Psychiatric: She has a normal mood and affect. Her behavior is normal. Judgment and thought content normal.      Assessment & Plan:  Encounter for consultation  Recommend the voluma type filler for the midface and light Botox for the forehead with caution so as not to raise the brow any further.  Patient agrees with the plan.  We will see her on the filler day.

## 2018-07-23 ENCOUNTER — Encounter: Payer: No Typology Code available for payment source | Admitting: Plastic Surgery

## 2018-07-25 ENCOUNTER — Ambulatory Visit (INDEPENDENT_AMBULATORY_CARE_PROVIDER_SITE_OTHER): Payer: Self-pay | Admitting: Plastic Surgery

## 2018-07-25 VITALS — BP 118/73 | HR 100 | Ht 63.0 in | Wt 138.0 lb

## 2018-07-25 DIAGNOSIS — Z719 Counseling, unspecified: Secondary | ICD-10-CM

## 2018-07-26 ENCOUNTER — Encounter: Payer: Self-pay | Admitting: Plastic Surgery

## 2018-07-26 NOTE — Progress Notes (Addendum)
Botulinum Toxin  Procedure: Cosmetic botulinum toxin  Pre-operative Diagnosis: Dynamic rhytides   Post-operative Diagnosis: Same  Complications:  None  Brief history: The patient desires botulinum toxin injection of her forehead. I discussed with the patient this proposed procedure of botulinum toxin injections, which is customized depending on the particular needs of the patient. It is performed on facial rhytids as a temporary correction. The alternatives were discussed with the patient. The risks were addressed including bleeding, scarring, infection, damage to deeper structures, asymmetry, and chronic pain, which may occur infrequently after a procedure. The individual's choice to undergo a surgical procedure is based on the comparison of risks to potential benefits. Other risks include unsatisfactory results, brow ptosis, eyelid ptosis, allergic reaction, temporary paralysis, which should go away with time, bruising, blurring disturbances and delayed healing. Botulinum toxin injections do not arrest the aging process or produce permanent tightening of the eyelid.  Operative intervention maybe necessary to maintain the results of a blepharoplasty or botulinum toxin. The patient understands and wishes to proceed. An informed consent was signed and informational brochures given to her prior to the procedure.  Procedure: The area was prepped with alcohol and dried with a clean gauze. Using a clean technique, the botulinum toxin was diluted with 1.25 cc of preservative-free normal saline which was slowly injected with an 18 gauge needle in a tuberculin syringes.  A 32 gauge needles were then used to inject the botulinum toxin. This mixture allow for an aliquot of 5 units per 0.1 cc in each injection site.    Subsequently the mixture was injected in the glabellar and forehead area with preservation of the temporal branch to the lateral eyebrow as well as into each lateral canthal area beginning from the  lateral orbital rim medial to the zygomaticus major in 3 separate areas. A total of 150 Units of botulinum toxin was used. The forehead and glabellar area was injected with care to inject intramuscular only while holding pressure on the supratrochlear vessels in each area during each injection on either side of the medial corrugators. The injection proceeded vertically superiorly to the medial 2/3 of the frontalis muscle and superior 2/3 of the lateral frontalis, again with preservation of the frontal branch.  No complications were noted. Light pressure was held for 5 minutes. She was instructed explicitly in post-operative care.  Dysport LOT:  [id number] EXP:  03/07/19  Plan for Lyft x 2 or 3 in the midface and side chin lower perioral area.

## 2018-08-02 ENCOUNTER — Encounter: Payer: Self-pay | Admitting: Plastic Surgery

## 2018-08-02 ENCOUNTER — Ambulatory Visit (INDEPENDENT_AMBULATORY_CARE_PROVIDER_SITE_OTHER): Payer: Self-pay | Admitting: Plastic Surgery

## 2018-08-02 VITALS — BP 100/80 | HR 94 | Ht 63.0 in | Wt 138.0 lb

## 2018-08-02 DIAGNOSIS — Z719 Counseling, unspecified: Secondary | ICD-10-CM

## 2018-08-02 NOTE — Progress Notes (Signed)
Filler Injection Procedure Note  Procedure: Filler administration  Pre-operative Diagnosis: midface volume loss  Post-operative Diagnosis: Same  Complications:  None  Brief history: The patient desires volume in her midface. I discussed with the patient this proposed procedure of the filler injections, which is customized depending on the particular needs of the patient. It is a temporary correction. The alternatives were discussed with the patient. The risks were addressed including bleeding, scarring, infection, damage to deeper structures, asymmetry, and chronic pain, which may occur infrequently after a procedure. The individual's choice to undergo a surgical procedure is based on the comparison of risks to potential benefits. Other risks include unsatisfactory results, allergic reaction, temporary paralysis, which should go away with time, bruising, blurring disturbances and delayed healing. Injections do not arrest the aging process or produce permanent tightening of the skin.  Operative intervention maybe necessary to maintain the results. The patient understands and wishes to proceed. An informed consent was signed and informational brochures given to her prior to the procedure.  Procedure: The area was prepped with chlorhexidine and dried with a clean gauze. Using a clean technique, the filler was injected.  The midface area was injected at the 3 sub-regions of the mid-face: zygomaticomalar region, anteromedial cheek region, and submalar region for a total of one syringe on each side of the face. The technique used was serial puncture with equal injections in the 3 sub-regions: the zygomaticomalar region, the anteromedial cheek, and the submalar region.  No complications were noted. Light pressure was held for 5 minutes. She was instructed explicitly in post-operative care.  Restylane lyft x 2 LOT: [id number] EXP: 2020-10-04

## 2018-09-10 ENCOUNTER — Ambulatory Visit (INDEPENDENT_AMBULATORY_CARE_PROVIDER_SITE_OTHER): Payer: Self-pay | Admitting: Plastic Surgery

## 2018-09-10 ENCOUNTER — Encounter: Payer: Self-pay | Admitting: Plastic Surgery

## 2018-09-10 VITALS — BP 129/76 | HR 64 | Ht 67.0 in

## 2018-09-10 DIAGNOSIS — Z719 Counseling, unspecified: Secondary | ICD-10-CM

## 2018-09-10 NOTE — Progress Notes (Signed)
Botulinum Toxin Procedure Note  Procedure: Cosmetic botulinum toxin   Pre-operative Diagnosis: Dynamic rhytides   Post-operative Diagnosis: Same  Complications:  None  Brief history: The patient desires botulinum toxin injection of her forehead. I discussed with the patient this proposed procedure of botulinum toxin injections, which is customized depending on the particular needs of the patient. It is performed on facial rhytids as a temporary correction. The alternatives were discussed with the patient. The risks were addressed including bleeding, scarring, infection, damage to deeper structures, asymmetry, and chronic pain, which may occur infrequently after a procedure. The individual's choice to undergo a surgical procedure is based on the comparison of risks to potential benefits. Other risks include unsatisfactory results, brow ptosis, eyelid ptosis, allergic reaction, temporary paralysis, which should go away with time, bruising, blurring disturbances and delayed healing. Botulinum toxin injections do not arrest the aging process or produce permanent tightening of the eyelid.  Operative intervention maybe necessary to maintain the results of a blepharoplasty or botulinum toxin. The patient understands and wishes to proceed. An informed consent was signed and informational brochures given to her prior to the procedure.  Procedure: The area was prepped with alcohol and dried with a clean gauze. Using a clean technique, the botulinum toxin was diluted with 1.25 cc of preservative-free normal saline which was slowly injected with an 18 gauge needle in a tuberculin syringes.  A 32 gauge needles were then used to inject the botulinum toxin. This mixture allow for an aliquot of 5 units per 0.1 cc in each injection site.    Subsequently the mixture was injected in the glabellar and forehead area with preservation of the temporal branch to the lateral eyebrow as well as into each lateral canthal area  beginning from the lateral orbital rim medial to the zygomaticus major in 3 separate areas. A total of 25 Units of botulinum toxin was used. The forehead and glabellar area was injected with care to inject intramuscular only while holding pressure on the supratrochlear vessels in each area during each injection on either side of the medial corrugators. The injection proceeded vertically superiorly to the medial 2/3 of the frontalis muscle and superior 2/3 of the lateral frontalis, again with preservation of the frontal branch.  No complications were noted. Light pressure was held for 5 minutes. She was instructed explicitly in post-operative care.  Botox LOT:  [id number] EXP:  5/22

## 2018-09-25 ENCOUNTER — Encounter: Payer: Self-pay | Admitting: Physician Assistant

## 2018-09-25 ENCOUNTER — Other Ambulatory Visit: Payer: Self-pay

## 2018-09-25 ENCOUNTER — Ambulatory Visit (INDEPENDENT_AMBULATORY_CARE_PROVIDER_SITE_OTHER): Payer: No Typology Code available for payment source | Admitting: Physician Assistant

## 2018-09-25 VITALS — BP 110/78 | HR 82 | Temp 97.5°F | Resp 16 | Ht 66.0 in | Wt 140.8 lb

## 2018-09-25 DIAGNOSIS — J019 Acute sinusitis, unspecified: Secondary | ICD-10-CM

## 2018-09-25 MED ORDER — DOXYCYCLINE HYCLATE 100 MG PO CAPS: 100.0000 mg | ORAL_CAPSULE | Freq: Two times a day (BID) | ORAL | 0 refills | Status: DC

## 2018-09-25 MED ORDER — BENZONATATE 100 MG PO CAPS: 100.0000 mg | ORAL_CAPSULE | Freq: Three times a day (TID) | ORAL | 0 refills | Status: DC | PRN

## 2018-09-25 NOTE — Patient Instructions (Signed)
Please take antibiotic as directed.  Increase fluid intake.  Use Saline nasal spray.  Take a daily multivitamin. Continue the Mucinex. Start the [employer] and restart Flonase.  Place a humidifier in the bedroom.  Please call or return clinic if symptoms are not improving.  Sinusitis Sinusitis is redness, soreness, and swelling (inflammation) of the paranasal sinuses. Paranasal sinuses are air pockets within the bones of your face (beneath the eyes, the middle of the forehead, or above the eyes). In healthy paranasal sinuses, mucus is able to drain out, and air is able to circulate through them by way of your nose. However, when your paranasal sinuses are inflamed, mucus and air can become trapped. This can allow bacteria and other germs to grow and cause infection. Sinusitis can develop quickly and last only a short time (acute) or continue over a long period (chronic). Sinusitis that lasts for more than 12 weeks is considered chronic.  CAUSES  Causes of sinusitis include:  Allergies.  Structural abnormalities, such as displacement of the cartilage that separates your nostrils (deviated septum), which can decrease the air flow through your nose and sinuses and affect sinus drainage.  Functional abnormalities, such as when the small hairs (cilia) that line your sinuses and help remove mucus do not work properly or are not present. SYMPTOMS  Symptoms of acute and chronic sinusitis are the same. The primary symptoms are pain and pressure around the affected sinuses. Other symptoms include:  Upper toothache.  Earache.  Headache.  Bad breath.  Decreased sense of smell and taste.  A cough, which worsens when you are lying flat.  Fatigue.  Fever.  Thick drainage from your nose, which often is green and may contain pus (purulent).  Swelling and warmth over the affected sinuses. DIAGNOSIS  Your caregiver will perform a physical exam. During the exam, your caregiver may:  Look in your nose  for signs of abnormal growths in your nostrils (nasal polyps).  Tap over the affected sinus to check for signs of infection.  View the inside of your sinuses (endoscopy) with a special imaging device with a light attached (endoscope), which is inserted into your sinuses. If your caregiver suspects that you have chronic sinusitis, one or more of the following tests may be recommended:  Allergy tests.  Nasal culture A sample of mucus is taken from your nose and sent to a lab and screened for bacteria.  Nasal cytology A sample of mucus is taken from your nose and examined by your caregiver to determine if your sinusitis is related to an allergy. TREATMENT  Most cases of acute sinusitis are related to a viral infection and will resolve on their own within 10 days. Sometimes medicines are prescribed to help relieve symptoms (pain medicine, decongestants, nasal steroid sprays, or saline sprays).  However, for sinusitis related to a bacterial infection, your caregiver will prescribe antibiotic medicines. These are medicines that will help kill the bacteria causing the infection.  Rarely, sinusitis is caused by a fungal infection. In theses cases, your caregiver will prescribe antifungal medicine. For some cases of chronic sinusitis, surgery is needed. Generally, these are cases in which sinusitis recurs more than 3 times per year, despite other treatments. HOME CARE INSTRUCTIONS   Drink plenty of water. Water helps thin the mucus so your sinuses can drain more easily.  Use a humidifier.  Inhale steam 3 to 4 times a day (for example, sit in the bathroom with the shower running).  Apply a warm, moist washcloth  to your face 3 to 4 times a day, or as directed by your caregiver.  Use saline nasal sprays to help moisten and clean your sinuses.  Take over-the-counter or prescription medicines for pain, discomfort, or fever only as directed by your caregiver. SEEK IMMEDIATE MEDICAL CARE IF:  You  have increasing pain or severe headaches.  You have nausea, vomiting, or drowsiness.  You have swelling around your face.  You have vision problems.  You have a stiff neck.  You have difficulty breathing. MAKE SURE YOU:   Understand these instructions.  Will watch your condition.  Will get help right away if you are not doing well or get worse. Document Released: 07/24/2005 Document Revised: 10/16/2011 Document Reviewed: 08/08/2011 [facility] Patient Information 2014 [place], [place].

## 2018-09-25 NOTE — Progress Notes (Signed)
Patient presents to clinic today c/o 6 days of worsening sinus pressure, sinus pain, hoarseness, sinus fullness. Denies fever or chills. Notes chest congestion that is mild. Denies productive cough. Denies chest pain, SOB.  Has taken 1200 mg Mucinex this AM. 60 mg Dextromethorphan.   Past Medical History:  Diagnosis Date  . Migraine 08/19/2014   No recurrence after birth of son. Previously on flexeril.      Current Outpatient Medications on File Prior to Visit  Medication Sig Dispense Refill  . bimatoprost (LATISSE) 0.03 % ophthalmic solution Place into both eyes at bedtime. Place one drop on applicator and apply evenly along the skin of the upper eyelid at base of eyelashes once daily at bedtime; repeat procedure for second eye (use a clean applicator).    . clobetasol ointment (TEMOVATE) 9.93 % Apply 1 application topically 2 (two) times daily. Use for up to 7 days then need at least a 7 day break. 60 g 2  . fluticasone (FLONASE) 50 MCG/ACT nasal spray Place 2 sprays into both nostrils daily. 16 g 3   No current facility-administered medications on file prior to visit.     Allergies  Allergen Reactions  . Latex     swelling    Family History  Problem Relation Age of Onset  . Hypertension Mother   . Depression Mother        "depression in entire family"  . Heart attack Father 41       smoked marijuana for many years  . Hypertension Father   . Depression Father   . Breast cancer Maternal Grandmother        45s  . Diabetes Maternal Grandmother   . Dementia Maternal Grandmother     Social History   Socioeconomic History  . Marital status: Married    Spouse name: Not on file  . Number of children: Not on file  . Years of education: Not on file  . Highest education level: Not on file  Occupational History  . Not on file  Social Needs  . Financial resource strain: Not on file  . Food insecurity:    Worry: Not on file    Inability: Not on file  . Transportation  needs:    Medical: Not on file    Non-medical: Not on file  Tobacco Use  . Smoking status: Former Smoker    Packs/day: 1.00    Years: 21.00    Pack years: 21.00    Types: Cigarettes    Last attempt to quit: 11/06/2010    Years since quitting: 7.8  . Smokeless tobacco: Never Used  Substance and Sexual Activity  . Alcohol use: Yes    Alcohol/week: 5.0 standard drinks    Types: 5 Standard drinks or equivalent per week  . Drug use: No  . Sexual activity: Not on file  Lifestyle  . Physical activity:    Days per week: Not on file    Minutes per session: Not on file  . Stress: Not on file  Relationships  . Social connections:    Talks on phone: Not on file    Gets together: Not on file    Attends religious service: Not on file    Active member of club or organization: Not on file    Attends meetings of clubs or organizations: Not on file    Relationship status: Not on file  Other Topics Concern  . Not on file  Social History Narrative   Married 2015.  Son [id number] [name]- also patient of Dr. Yong Channel.     Finished HS.       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       Hobbies: football, reading, cooking, enjoys cleaning      Review of Systems - See HPI.  All other ROS are negative.  BP 110/78   Pulse 82   Temp (!) 97.5 F (36.4 C) (Oral)   Resp 16   Ht 5\' 6"  (1.676 m)   SpO2 98%   BMI 22.27 kg/m   Physical Exam Vitals signs reviewed.  Constitutional:      Appearance: Normal appearance.  HENT:     Head: Normocephalic and atraumatic.     Right Ear: Tympanic membrane normal.     Left Ear: Tympanic membrane normal.     Nose: Congestion present.     Right Sinus: Frontal sinus tenderness present.     Left Sinus: Frontal sinus tenderness present.     Mouth/Throat:     Mouth: Mucous membranes are moist.     Pharynx: Oropharynx is clear.  Eyes:     Conjunctiva/sclera: Conjunctivae normal.  Neck:     Musculoskeletal: Neck supple.    Cardiovascular:     Rate and Rhythm: Normal rate and regular rhythm.     Heart sounds: Normal heart sounds.  Pulmonary:     Effort: Pulmonary effort is normal.     Breath sounds: Normal breath sounds.  Neurological:     Mental Status: She is alert.    Assessment/Plan: 1. Acute non-recurrent sinusitis, unspecified location Rx Doxycycline.  Increase fluids.  Rest.  Saline nasal spray.  Probiotic.  Mucinex as directed.  Humidifier in bedroom. Tessalon per orders.  Call or return to clinic if symptoms are not improving.  - doxycycline (VIBRAMYCIN) 100 MG capsule; Take 1 capsule (100 mg total) by mouth 2 (two) times daily.  Dispense: 20 capsule; Refill: 0 - benzonatate (TESSALON) 100 MG capsule; Take 1 capsule (100 mg total) by mouth 3 (three) times daily as needed for cough.  Dispense: 30 capsule; Refill: 0   Leeanne Rio, PA-C

## 2018-10-03 ENCOUNTER — Ambulatory Visit (INDEPENDENT_AMBULATORY_CARE_PROVIDER_SITE_OTHER): Payer: No Typology Code available for payment source | Admitting: Family Medicine

## 2018-10-03 ENCOUNTER — Encounter: Payer: Self-pay | Admitting: Family Medicine

## 2018-10-03 DIAGNOSIS — L570 Actinic keratosis: Secondary | ICD-10-CM | POA: Diagnosis not present

## 2018-10-03 NOTE — Progress Notes (Signed)
Phone [phone]   Subjective:  Megan Mcmillan is a 43 y.o. year old very pleasant female patient who presents for/with See problem oriented charting ROS- notes red sometimes scaling lesions on leg- these occurred after recent trip and sun exposure. Some anxiety due to history of precancerous and cancerous skin lesions. No expanding erythema. No fever or chills reported. Has felt more energy lately with more exercise.    Past Medical History-  Patient Active Problem List   Diagnosis Date Noted  . History of squamous cell carcinoma 08/19/2014    Priority: Medium  . Seborrheic dermatitis 08/28/2017    Priority: Low  . Actinic keratosis 06/08/2015    Priority: Low  . Acromioclavicular sprain 04/29/2015    Priority: Low  . History of cervical cancer 08/19/2014    Priority: Low  . Vitamin D deficiency 08/19/2014    Priority: Low  . Former smoker 08/19/2014    Priority: Low  . Tendonitis 05/31/2018  . Olecranon bursitis of right elbow 05/31/2018  . Weakness of right hip 03/28/2018  . Nonallopathic lesion of thoracic region 03/28/2018  . Nonallopathic lesion of lumbosacral region 03/28/2018  . Nonallopathic lesion of sacral region 03/28/2018    Medications- reviewed and updated Current Outpatient Medications  Medication Sig Dispense Refill  . bimatoprost (LATISSE) 0.03 % ophthalmic solution Place into both eyes at bedtime. Place one drop on applicator and apply evenly along the skin of the upper eyelid at base of eyelashes once daily at bedtime; repeat procedure for second eye (use a clean applicator).    . clobetasol ointment (TEMOVATE) 6.01 % Apply 1 application topically 2 (two) times daily. Use for up to 7 days then need at least a 7 day break. 60 g 2  . fluticasone (FLONASE) 50 MCG/ACT nasal spray Place 2 sprays into both nostrils daily. 16 g 3   No current facility-administered medications for this visit.      Objective:  BP 108/60 (BP Location: Right Arm, Patient  Position: Sitting, Cuff Size: Normal)   Pulse 86   Temp 98.1 F (36.7 C) (Oral)   Ht 5\' 6"  (1.676 m)   Wt 141 lb (64 kg)   SpO2 97%   BMI 22.76 kg/m  Gen: NAD, resting comfortably CV: RRR  Lungs: nonlabored, normal respiratory rate Abdomen: soft/nondistended Ext: no edema Skin: warm, dry, has 5 erythematous lesions under 5 mm with fine scale on bilateral lower legs  Procedure note: Benefits and risks verbally discussed with patient 5 second freeze thaw cycle of cryotherapy performed  with liquid nitrogen to 5 lesions on right lower leg and 5 on left lower leg No complications.  Patient tolerated the procedure well other than mild pain. Discussed aftercare. No handout or avs given per patient preference and experience with cryotherapy.     Assessment and Plan   Actinic keratosis x 10 S: patient presents today with concern about precancers on legs. She was in [place] recently (had great trip and got 14k steps per day!). Had a lot of sun exposure and has noted several red bumps with fine scale to them on both anterior legs (may have been present before but became more prevalent after the trip) . Some of these are irritating when she shaves. She seems to heal quickly with cryotherapy and she is well aware of precautions for care A/P: 10 AKs noted today- treated with cryotherapy. Return precautions discussed.    She also has been exercising on regular basis at [employer]- has a  health competition going on for calories burned per day with other people she knos with iphone- has really enjoyed this experience. Congratulated her on efforts!   Return precautions advised.  Garret Reddish, MD

## 2018-10-04 NOTE — Assessment & Plan Note (Signed)
S: patient presents today with concern about precancers on legs. She was in [place] recently (had great trip and got 14k steps per day!). Had a lot of sun exposure and has noted several red bumps with fine scale to them on both anterior legs (may have been present before but became more prevalent after the trip) . Some of these are irritating when she shaves. She seems to heal quickly with cryotherapy and she is well aware of precautions for care A/P: 10 AKs noted today- treated with cryotherapy. Return precautions discussed.

## 2018-10-24 ENCOUNTER — Ambulatory Visit: Payer: No Typology Code available for payment source | Admitting: Family Medicine

## 2018-10-24 ENCOUNTER — Other Ambulatory Visit: Payer: Self-pay

## 2018-10-24 ENCOUNTER — Encounter: Payer: Self-pay | Admitting: Family Medicine

## 2018-10-24 VITALS — BP 102/70 | HR 74 | Ht 66.0 in | Wt 143.0 lb

## 2018-10-24 DIAGNOSIS — M999 Biomechanical lesion, unspecified: Secondary | ICD-10-CM

## 2018-10-24 DIAGNOSIS — M9904 Segmental and somatic dysfunction of sacral region: Secondary | ICD-10-CM | POA: Insufficient documentation

## 2018-10-24 NOTE — Assessment & Plan Note (Signed)
Decision today to treat with OMT was based on Physical Exam  After verbal consent patient was treated with HVLA, ME, FPR techniques in  thoracic, lumbar and sacral areas  Patient tolerated the procedure well with improvement in symptoms  Patient given exercises, stretches and lifestyle modifications  See medications in patient instructions if given  Patient will follow up in 4-6 weeks 

## 2018-10-24 NOTE — Assessment & Plan Note (Signed)
Because posture and ergonomics.  Left side.  No radiation of the pain.  Responded well to osteopathic manipulation.  As long as patient is well can follow-up as needed.

## 2018-10-24 NOTE — Progress Notes (Signed)
[facility] [address], [place] [address] Phone: [phone] Subjective:   I Megan Mcmillan am serving as a Education administrator for Dr. Hulan Saas.   CC: Hip pain  BDZ:[id number]  Megan Mcmillan is a 43 y.o. female coming in with complaint of hip pain. Left hip is painful. States that she feels out of alignment.  Patient is in some tightness overall.  Patient denies any radiation doing.  Was 39 Function her some movement that was a little odd since then has had some discomfort and pain.      Past Medical History:  Diagnosis Date  . Migraine 08/19/2014   No recurrence after birth of son. Previously on flexeril.     Past Surgical History:  Procedure Laterality Date  . CERVICAL CONIZATION W/BX  1997   cold knife, no abnormal paps since.   Marland Kitchen VAGINAL DELIVERY  1998   Social History   Socioeconomic History  . Marital status: Married    Spouse name: Not on file  . Number of children: Not on file  . Years of education: Not on file  . Highest education level: Not on file  Occupational History  . Not on file  Social Needs  . Financial resource strain: Not on file  . Food insecurity:    Worry: Not on file    Inability: Not on file  . Transportation needs:    Medical: Not on file    Non-medical: Not on file  Tobacco Use  . Smoking status: Former Smoker    Packs/day: 1.00    Years: 21.00    Pack years: 21.00    Types: Cigarettes    Last attempt to quit: 11/06/2010    Years since quitting: 7.9  . Smokeless tobacco: Never Used  Substance and Sexual Activity  . Alcohol use: Yes    Alcohol/week: 5.0 standard drinks    Types: 5 Standard drinks or equivalent per week  . Drug use: No  . Sexual activity: Not on file  Lifestyle  . Physical activity:    Days per week: Not on file    Minutes per session: Not on file  . Stress: Not on file  Relationships  . Social connections:    Talks on phone: Not on file    Gets together: Not on file   Attends religious service: Not on file    Active member of club or organization: Not on file    Attends meetings of clubs or organizations: Not on file    Relationship status: Not on file  Other Topics Concern  . Not on file  Social History Narrative   Married 2015. Son [id number] Megan Mcmillan- also patient of Dr. Yong Channel.     Finished HS.       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       Hobbies: football, reading, cooking, enjoys cleaning      Allergies  Allergen Reactions  . Latex     swelling   Family History  Problem Relation Age of Onset  . Hypertension Mother   . Depression Mother        "depression in entire family"  . Heart attack Father 41       smoked marijuana for many years  . Hypertension Father   . Depression Father   . Breast cancer Maternal Grandmother        78s  . Diabetes Maternal Grandmother   . Dementia Maternal Grandmother  Current Outpatient Medications (Respiratory):  .  fluticasone (FLONASE) 50 MCG/ACT nasal spray, Place 2 sprays into both nostrils daily.    Current Outpatient Medications (Other):  .  bimatoprost (LATISSE) 0.03 % ophthalmic solution, Place into both eyes at bedtime. Place one drop on applicator and apply evenly along the skin of the upper eyelid at base of eyelashes once daily at bedtime; repeat procedure for second eye (use a clean applicator). .  clobetasol ointment (TEMOVATE) 1.17 %, Apply 1 application topically 2 (two) times daily. Use for up to 7 days then need at least a 7 day break.    Past medical history, social, surgical and family history all reviewed in electronic medical record.  No pertanent information unless stated regarding to the chief complaint.   Review of Systems:  No headache, visual changes, nausea, vomiting, diarrhea, constipation, dizziness, abdominal pain, skin rash, fevers, chills, night sweats, weight loss, swollen lymph nodes, body aches, joint swelling, muscle aches,  chest pain, shortness of breath, mood changes.   Objective  Blood pressure 102/70, pulse 74, height 5\' 6"  (1.676 m), weight 143 lb (64.9 kg), SpO2 98 %. Systems examined below as of    General: No apparent distress alert and oriented x3 mood and affect normal, dressed appropriately.  HEENT: Pupils equal, extraocular movements intact  Respiratory: Patient's speak in full sentences and does not appear short of breath  Cardiovascular: No lower extremity edema, non tender, no erythema  Skin: Warm dry intact with no signs of infection or rash on extremities or on axial skeleton.  Abdomen: Soft nontender  Neuro: Cranial nerves II through XII are intact, neurovascularly intact in all extremities with 2+ DTRs and 2+ pulses.  Lymph: No lymphadenopathy of posterior or anterior cervical chain or axillae bilaterally.  Gait normal with good balance and coordination.  MSK:  Non tender with full range of motion and good stability and symmetric strength and tone of shoulders, elbows, wrist, , knee and ankles bilaterally.  Hip: left  ROM IR: 25 Deg, ER: 45 Deg, Flexion: 120 Deg, Extension: 100 Deg, Abduction: 45 Deg, Adduction: 35 Deg Strength IR: 5/5, ER: 5/5, Flexion: 5/5, Extension: 5/5, Abduction: 5/5, Adduction: 5/5 Pelvic alignment unremarkable to inspection and palpation. Standing hip rotation and gait without trendelenburg sign / unsteadiness. Patient does have some mild tenderness over the left sacroiliac joint mild positive Corky Sox. Tenderness over the left sacroiliac joint  Osteopathic findings T7 extended rotated and side bent left L3 flexed rotated and side bent left  Sacrum right on right    Impression and Recommendations:     This case required medical decision making of moderate complexity. The above documentation has been reviewed and is accurate and complete Lyndal Pulley, DO       Note: This dictation was prepared with Dragon dictation along with smaller phrase technology.  Any transcriptional errors that result from this process are unintentional.

## 2018-10-25 ENCOUNTER — Encounter: Payer: Self-pay | Admitting: Family Medicine

## 2018-10-25 MED ORDER — FLUTICASONE PROPIONATE 50 MCG/ACT NA SUSP: 2.0000 | Freq: Every day | NASAL | 3 refills | Status: DC

## 2018-10-25 MED FILL — FLUTICASONE PROP 50 MCG SPR: 50 | 30 days supply | Qty: 16 | Fill #0

## 2018-10-28 ENCOUNTER — Ambulatory Visit: Payer: No Typology Code available for payment source | Admitting: Family Medicine

## 2018-11-05 ENCOUNTER — Encounter: Payer: Self-pay | Admitting: Family Medicine

## 2018-11-05 ENCOUNTER — Ambulatory Visit: Payer: Self-pay

## 2018-11-05 ENCOUNTER — Other Ambulatory Visit: Payer: Self-pay

## 2018-11-05 ENCOUNTER — Ambulatory Visit: Payer: No Typology Code available for payment source | Admitting: Family Medicine

## 2018-11-05 VITALS — BP 110/80 | HR 69 | Ht 66.0 in | Wt 142.0 lb

## 2018-11-05 DIAGNOSIS — M25559 Pain in unspecified hip: Secondary | ICD-10-CM | POA: Diagnosis not present

## 2018-11-05 DIAGNOSIS — M7602 Gluteal tendinitis, left hip: Secondary | ICD-10-CM

## 2018-11-05 DIAGNOSIS — R29898 Other symptoms and signs involving the musculoskeletal system: Secondary | ICD-10-CM

## 2018-11-05 MED ORDER — TIZANIDINE HCL 4 MG PO TABS: 4.0000 mg | ORAL_TABLET | Freq: Every evening | ORAL | 2 refills | Status: AC

## 2018-11-05 MED ORDER — GABAPENTIN 100 MG PO CAPS: 100.0000 mg | ORAL_CAPSULE | Freq: Every day | ORAL | 3 refills | Status: DC

## 2018-11-05 MED FILL — tiZANidine HCL 4 MG TABS: 4 | 30 days supply | Qty: 30 | Fill #0

## 2018-11-05 MED FILL — GABAPENTIN 100 MG CAPSULE: 100 | 30 days supply | Qty: 30 | Fill #0

## 2018-11-05 NOTE — Progress Notes (Signed)
[facility] [address] [place], [place] [address] Phone: [phone] Subjective:   I Megan Mcmillan am serving as a Education administrator for Dr. Hulan Saas.     CC: Back pain  EGB:[id number]  Megan Mcmillan is a 43 y.o. female coming in with complaint of back pain. Constant dull ache. Pain at night.  Patient was seen previously and had more of the hip flexor spasm as well as gluteal tendon irritation.  More and seems to be the irritation in the gluteal area.  Going down to her calf.  Patient states that it is severe.  Denies any weakness.  Rates the severity of pain is 9 out of 10    Past Medical History:  Diagnosis Date  . Migraine 08/19/2014   No recurrence after birth of son. Previously on flexeril.     Past Surgical History:  Procedure Laterality Date  . CERVICAL CONIZATION W/BX  1997   cold knife, no abnormal paps since.   Marland Kitchen VAGINAL DELIVERY  1998   Social History   Socioeconomic History  . Marital status: Married    Spouse name: Not on file  . Number of children: Not on file  . Years of education: Not on file  . Highest education level: Not on file  Occupational History  . Not on file  Social Needs  . Financial resource strain: Not on file  . Food insecurity:    Worry: Not on file    Inability: Not on file  . Transportation needs:    Medical: Not on file    Non-medical: Not on file  Tobacco Use  . Smoking status: Former Smoker    Packs/day: 1.00    Years: 21.00    Pack years: 21.00    Types: Cigarettes    Last attempt to quit: 11/06/2010    Years since quitting: 8.0  . Smokeless tobacco: Never Used  Substance and Sexual Activity  . Alcohol use: Yes    Alcohol/week: 5.0 standard drinks    Types: 5 Standard drinks or equivalent per week  . Drug use: No  . Sexual activity: Not on file  Lifestyle  . Physical activity:    Days per week: Not on file    Minutes per session: Not on file  . Stress: Not on file  Relationships  . Social  connections:    Talks on phone: Not on file    Gets together: Not on file    Attends religious service: Not on file    Active member of club or organization: Not on file    Attends meetings of clubs or organizations: Not on file    Relationship status: Not on file  Other Topics Concern  . Not on file  Social History Narrative   Married 2015. Son 789 Megan Mcmillan- also patient of Dr. Yong Channel.     Finished HS.       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       Hobbies: football, reading, cooking, enjoys cleaning      Allergies  Allergen Reactions  . Latex     swelling   Family History  Problem Relation Age of Onset  . Hypertension Mother   . Depression Mother        "depression in entire family"  . Heart attack Father 41       smoked marijuana for many years  . Hypertension Father   . Depression Father   .  Breast cancer Maternal Grandmother        30s  . Diabetes Maternal Grandmother   . Dementia Maternal Grandmother       Current Outpatient Medications (Respiratory):  .  fluticasone (FLONASE) 50 MCG/ACT nasal spray, Place 2 sprays into both nostrils daily.    Current Outpatient Medications (Other):  .  bimatoprost (LATISSE) 0.03 % ophthalmic solution, Place into both eyes at bedtime. Place one drop on applicator and apply evenly along the skin of the upper eyelid at base of eyelashes once daily at bedtime; repeat procedure for second eye (use a clean applicator). .  clobetasol ointment (TEMOVATE) 1.44 %, Apply 1 application topically 2 (two) times daily. Use for up to 7 days then need at least a 7 day break. .  gabapentin (NEURONTIN) 100 MG capsule, Take 1 capsule (100 mg total) by mouth at bedtime. Marland Kitchen  tiZANidine (ZANAFLEX) 4 MG tablet, Take 1 tablet (4 mg total) by mouth Nightly for 10 days.    Past medical history, social, surgical and family history all reviewed in electronic medical record.  No pertanent information unless stated  regarding to the chief complaint.   Review of Systems:  No headache, visual changes, nausea, vomiting, diarrhea, constipation, dizziness, abdominal pain, skin rash, fevers, chills, night sweats, weight loss, swollen lymph nodes, body aches, joint swelling, , chest pain, shortness of breath, mood changes.  Onset of muscle aches  Objective  Blood pressure 110/80, pulse 69, height 5\' 6"  (1.676 m), weight 142 lb (64.4 kg), SpO2 99 %.     General: No apparent distress alert and oriented x3 mood and affect normal, dressed appropriately.  HEENT: Pupils equal, extraocular movements intact  Respiratory: Patient's speak in full sentences and does not appear short of breath  Cardiovascular: No lower extremity edema, non tender, no erythema  Skin: Warm dry intact with no signs of infection or rash on extremities or on axial skeleton.  Abdomen: Soft nontender  Neuro: Cranial nerves II through XII are intact, neurovascularly intact in all extremities with 2+ DTRs and 2+ pulses.  Lymph: No lymphadenopathy of posterior or anterior cervical chain or axillae bilaterally.  Gait normal with good balance and coordination.  MSK:  Non tender with full range of motion and good stability and symmetric strength and tone of shoulders, elbows, wrist,  knee and ankles bilaterally.  Hip: Left ROM IR: 25 Deg, ER: 25 Deg, Flexion: 120 Deg, Extension: 100 Deg, Abduction: 45 Deg, Adduction: 45 Deg Strength IR: 5/5, ER: 5/5, Flexion: 5/5, Extension: 5/5, Abduction: 4/5, Adduction: 5/5 Pelvic alignment unremarkable to inspection and palpation. Standing hip rotation and gait without trendelenburg sign / unsteadiness. Pain more over the gluteal area as well as the sacroiliac joint.  Tightness with straight leg test positive Corky Sox on the left  Procedure: Real-time Ultrasound Guided Injection of left gluteal tendon sheath Device: GE Logiq Q7 Ultrasound guided injection is preferred based studies that show increased duration,  increased effect, greater accuracy, decreased procedural pain, increased response rate, and decreased cost with ultrasound guided versus blind injection.  Verbal informed consent obtained.  Time-out conducted.  Noted no overlying erythema, induration, or other signs of local infection.  Skin prepped in a sterile fashion.  Local anesthesia: Topical Ethyl chloride.  With sterile technique and under real time ultrasound guidance: With a 21-gauge 3 inch needle patient was injected with 1 cc of 0.5% Marcaine and 1 cc of Kenalog 40 mg/mL into the gluteal tendon sheath Completed without difficulty  Pain immediately resolved  suggesting accurate placement of the medication.  Advised to call if fevers/chills, erythema, induration, drainage, or persistent bleeding.  Images permanently stored and available for review in the ultrasound unit.  Impression: Technically successful ultrasound guided injection.   Impression and Recommendations:     This case required medical decision making of moderate complexity. The above documentation has been reviewed and is accurate and complete Lyndal Pulley, DO       Note: This dictation was prepared with Dragon dictation along with smaller phrase technology. Any transcriptional errors that result from this process are unintentional.

## 2018-11-05 NOTE — Assessment & Plan Note (Signed)
Patient given injection and tolerated the procedure well.  Discussed icing regimen and home exercise.  Discussed which activities of doing which wants to avoid.  Increase activity slowly.  Gabapentin and Zanaflex given at night.  Follow-up again 4 to 8 weeks

## 2018-11-05 NOTE — Patient Instructions (Addendum)
Good to see you  Ice 20 minutes 2 times daily. Usually after activity and before bed. Injected the glute tendon  Zanaflex at night  Send a message next week so I know

## 2018-11-22 ENCOUNTER — Encounter: Payer: Self-pay | Admitting: Plastic Surgery

## 2018-11-22 ENCOUNTER — Ambulatory Visit (INDEPENDENT_AMBULATORY_CARE_PROVIDER_SITE_OTHER): Payer: Self-pay | Admitting: Plastic Surgery

## 2018-11-22 ENCOUNTER — Other Ambulatory Visit: Payer: Self-pay

## 2018-11-22 DIAGNOSIS — Z719 Counseling, unspecified: Secondary | ICD-10-CM | POA: Insufficient documentation

## 2018-11-22 NOTE — Progress Notes (Signed)
Botulinum Toxin Procedure Note  Procedure: Cosmetic botulinum toxin  Pre-operative Diagnosis: Dynamic rhytides   Post-operative Diagnosis: Same  Complications:  None  Brief history: The patient desires botulinum toxin injection of her forehead. I discussed with the patient this proposed procedure of botulinum toxin injections, which is customized depending on the particular needs of the patient. It is performed on facial rhytids as a temporary correction. The alternatives were discussed with the patient. The risks were addressed including bleeding, scarring, infection, damage to deeper structures, asymmetry, and chronic pain, which may occur infrequently after a procedure. The individual's choice to undergo a surgical procedure is based on the comparison of risks to potential benefits. Other risks include unsatisfactory results, brow ptosis, eyelid ptosis, allergic reaction, temporary paralysis, which should go away with time, bruising, blurring disturbances and delayed healing. Botulinum toxin injections do not arrest the aging process or produce permanent tightening of the eyelid.  Operative intervention maybe necessary to maintain the results of a blepharoplasty or botulinum toxin. The patient understands and wishes to proceed. An informed consent was signed and informational brochures given to her prior to the procedure.  Procedure: The area was prepped with alcohol and dried with a clean gauze. Using a clean technique, the botulinum toxin was diluted with 1.25 cc of preservative-free normal saline which was slowly injected with an 18 gauge needle in a tuberculin syringes.  A 32 gauge needles were then used to inject the botulinum toxin. This mixture allow for an aliquot of 5 units per 0.1 cc in each injection site.    Subsequently the mixture was injected in the glabellar and forehead area with preservation of the temporal branch to the lateral eyebrow as well as into each lateral canthal area  beginning from the lateral orbital rim medial to the zygomaticus major in 3 separate areas. A total of 30 Units of botulinum toxin was used. The forehead and glabellar area was injected with care to inject intramuscular only while holding pressure on the supratrochlear vessels in each area during each injection on either side of the medial corrugators. The injection proceeded vertically superiorly to the medial 2/3 of the frontalis muscle and superior 2/3 of the lateral frontalis, again with preservation of the frontal branch.  No complications were noted. Light pressure was held for 5 minutes. She was instructed explicitly in post-operative care.  Botox LOT:  [id number] C2 EXP:  5/22

## 2018-11-26 ENCOUNTER — Encounter: Payer: No Typology Code available for payment source | Admitting: Plastic Surgery

## 2018-12-09 ENCOUNTER — Ambulatory Visit (INDEPENDENT_AMBULATORY_CARE_PROVIDER_SITE_OTHER): Payer: No Typology Code available for payment source | Admitting: Family Medicine

## 2018-12-09 ENCOUNTER — Encounter: Payer: Self-pay | Admitting: Family Medicine

## 2018-12-09 VITALS — Temp 99.0°F | Ht 66.0 in | Wt 143.0 lb

## 2018-12-09 DIAGNOSIS — R509 Fever, unspecified: Secondary | ICD-10-CM

## 2018-12-09 DIAGNOSIS — L564 Polymorphous light eruption: Secondary | ICD-10-CM | POA: Diagnosis not present

## 2018-12-09 MED ORDER — CLOBETASOL PROPIONATE 0.05 % EX CREA: 1.0000 "application " | TOPICAL_CREAM | Freq: Two times a day (BID) | CUTANEOUS | 3 refills | Status: DC

## 2018-12-09 MED FILL — CLOBETASOL PROPIONATE 0.05: 0.05 | 10 days supply | Qty: 30 | Fill #0

## 2018-12-09 NOTE — Progress Notes (Signed)
VIRTUAL VISIT VIA VIDEO  I connected with Megan Mcmillan on 12/09/18 at  3:00 PM EDT by a video enabled telemedicine application and verified that I am speaking with the correct person using two identifiers. Location patient: Home Location provider: [facility], Office Persons participating in the virtual visit: Patient, Dr. Raoul Pitch and R.Baker, LPN  I discussed the limitations of evaluation and management by telemedicine and the availability of in person appointments. The patient expressed understanding and agreed to proceed.   SUBJECTIVE Chief Complaint  Patient presents with  . Fever    HPI:  Skin rash:  She reports developing a skin rash after sun exposure over the weekend.  Reports over the last few months she has had a similar rash that occurs 1 to 2 days after sun exposure.  Has been using clobetasol ointment, which clears the rash within a few days.  The ointment is difficult to use and she is desiring a cream format if possible.  She also had a very low-grade temperature of 99 F when tested this afternoon.  She denies chills, headache, sinus congestion, nausea, cough or shortness of breath.  She reports her office is 57 F right now and she feels the elevated temp is possibly from the increased temperature in the office. She did not have a temperature this morning.  ROS: See pertinent positives and negatives per HPI.  Patient Active Problem List   Diagnosis Date Noted  . Encounter for counseling 11/22/2018  . Gluteal tendonitis of left buttock 11/05/2018  . Somatic dysfunction of left sacroiliac joint 10/24/2018  . Tendonitis 05/31/2018  . Olecranon bursitis of right elbow 05/31/2018  . Weakness of right hip 03/28/2018  . Nonallopathic lesion of thoracic region 03/28/2018  . Nonallopathic lesion of lumbosacral region 03/28/2018  . Nonallopathic lesion of sacral region 03/28/2018  . Seborrheic dermatitis 08/28/2017  . Actinic keratosis 06/08/2015  .  Acromioclavicular sprain 04/29/2015  . History of cervical cancer 08/19/2014  . Vitamin D deficiency 08/19/2014  . History of squamous cell carcinoma 08/19/2014  . Former smoker 08/19/2014    Social History   Tobacco Use  . Smoking status: Former Smoker    Packs/day: 1.00    Years: 21.00    Pack years: 21.00    Types: Cigarettes    Last attempt to quit: 11/06/2010    Years since quitting: 8.0  . Smokeless tobacco: Never Used  Substance Use Topics  . Alcohol use: Yes    Alcohol/week: 5.0 standard drinks    Types: 5 Standard drinks or equivalent per week    Current Outpatient Medications:  .  bimatoprost (LATISSE) 0.03 % ophthalmic solution, Place into both eyes at bedtime. Place one drop on applicator and apply evenly along the skin of the upper eyelid at base of eyelashes once daily at bedtime; repeat procedure for second eye (use a clean applicator)., Disp: , Rfl:  .  clobetasol ointment (TEMOVATE) 1.61 %, Apply 1 application topically 2 (two) times daily. Use for up to 7 days then need at least a 7 day break., Disp: 60 g, Rfl: 2 .  fluticasone (FLONASE) 50 MCG/ACT nasal spray, Place 2 sprays into both nostrils daily., Disp: 16 g, Rfl: 3 .  gabapentin (NEURONTIN) 100 MG capsule, Take 1 capsule (100 mg total) by mouth at bedtime., Disp: 30 capsule, Rfl: 3  Allergies  Allergen Reactions  . Latex     swelling    OBJECTIVE: Temp 99 F (37.2 C) (Oral)   Ht  5\' 6"  (1.676 m)   Wt 143 lb (64.9 kg)   BMI 23.08 kg/m  Gen: No acute distress. Nontoxic in appearance.  HENT: AT. [place].  MMM.  Eyes:Pupils Equal Round Reactive to light, Extraocular movements intact,  Conjunctiva without redness, discharge or icterus. Chest: Cough or shortness of breath not present.  Skin: fine raised rash- red based, no vesicles lower leg, No purpura or petechiae.  Neuro: Alert. Oriented x3    ASSESSMENT AND PLAN: Megan Mcmillan is a 43 y.o. female present for  Polymorphic light eruption/low grade  fever: - Monitor for continued fever. If fever remains, report it to health at work- secondary to coronavirus outbreak. 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  AVS information on PMLE.  - clobetasol cream prescribed- BID for 5- 7 days, no longer than 14 days. Steroid creams are helpful, but are not ok to use long term or frequently. Long term use can thin skin and cause loss of pigmentation.  - Limiting sun exposure during peak times, sunscreen and keeping skin covered while in the sun is advised.  - Prevention is key.  - F/U PRN    Howard Pouch, DO 12/09/2018

## 2018-12-09 NOTE — Patient Instructions (Addendum)
HYDRATE Monitor for continued fever. If fever remains, report it to health at work- secondary to coronavirus outbreak.   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 I have included information for you.  Limiting sun exposure during peak times, sunscreen and keeping skin covered while in the sun will help.  Steroid creams are helpful, but are not ok to use long term or frequently. Long term use can thin skin and cause loss of pigmentation.  Prevention is key.    Polymorphous Light Eruption Polymorphous light eruption is a skin reaction to rays of energy that are found in sunlight (ultraviolet radiation, or UV radiation). Tanning beds and sunlamps also use UV radiation. Polymorphous light eruption can cause itchy, red patches to develop on skin that is exposed to UV radiation. The patches usually appear immediately after or within a few hours of being in the sun or after using a tanning bed or sunlamp. They often go away on their own within several days or weeks. What are the causes? The cause of this condition is not known. What increases the risk? You are more likely to develop this condition if:  You are a woman.  You have light-colored skin (light complexion).  You live in a [place].  You have a family history of this condition. What are the signs or symptoms? Symptoms of this condition include:  Small bumps on the skin.  Itchy patches on the skin.  Redness or scales on the skin.  A feeling of burning on the skin.  Swelling. This is rare.  Blisters. These are rare.  Chills, headache, nausea, and a general feeling of illness (malaise). These symptoms are rare. How is this diagnosed? This condition may be diagnosed based on:  A physical exam.  Your medical history.  Phototests. These are tests to check your skin's reaction to a type of ultraviolet light (UVA light). How is this treated? Polymorphous light eruption may clear up without  treatment. Treatment for this condition may include:  Protecting your skin from the sun with clothing and sunscreen.  Avoiding sunlight between the hours of 11 a.m. and 2 p.m.  Applying medicines to the skin to reduce swelling (topical corticosteroids).  Gradually spending more time in the sun to build resistance.  Oral immunosuppressive therapy, in rare cases. These are medicines taken by mouth that reduce the activity of the body's disease-fighting (immune) system. Follow these instructions at home:   Take and apply over-the-counter and prescription medicines only as told by your health care provider.  Wear clothing to protect your skin from sunlight. This includes long sleeves and hats.  Use broad-spectrum sunscreens that have an SPF of 30 or higher. Broad-spectrum means that the sunscreen protects your skin from both types of UV light (UVA and UVB). Check product labels to make sure that the sunscreen protects against UVA and UVB light.  Follow instructions from your health care provider about being in the sun. You may need to: ? Avoid spending time in the sun between the hours of 11 a.m. and 2 p.m. UV radiation is strongest during those hours. ? Gradually spend more and more time in sunlight.  Keep all follow-up visits as told by your health care provider. This is important. Contact a health care provider if:  You have a fever.  You have severe pain, and medicines do not help. Get help right away if:  You have severe skin blistering, peeling, or bleeding.  You have a very high fever  or chills.  You have a severe headache, you feel confused, or you faint.  You have nausea or vomiting.  You develop body aches and pains. Summary  Polymorphous light eruption is a skin reaction to rays of energy found in sunlight (UV radiation). This condition can also be caused by UV radiation from tanning beds and sun lamps.  You may have itchy, red patches on your skin that go away on  their own.  You may not need treatment. If you do need treatment, it usually includes protective clothing to shield your skin from the sun, broad-spectrum sunscreens with an SPF of 30 or higher, and medicines that you apply to your skin. This information is not intended to replace advice given to you by your health care provider. Make sure you discuss any questions you have with your health care provider. Document Released: 10/20/2016 Document Revised: 10/20/2016 Document Reviewed: 10/20/2016 Elsevier Interactive Patient Education  2019 [place], Adult  Sunburn is damage to the skin that is caused by being in the sun too much. Getting too much sun over and over can cause wrinkles and dark spots on the skin (sun spots). It can also increase your chance of getting skin cancer. Follow these instructions at home: Medicines  Take or apply over-the-counter and prescription medicines only as told by your doctor.  If you were prescribed an antibiotic medicine, use it as told by your doctor. Do not stop using the antibiotic even if your condition improves. General instructions   Avoid being in the sun. Wear clothing that covers your sunburn.  Do not put ice on your sunburn. Try taking a cool bath or putting a cool, wet cloth (cool compress) on your skin. This may help with pain.  Drink enough fluid to keep your pee (urine) pale yellow.  Try putting aloe vera or a moisturizer that has soy in it on your sunburn. This may help your pain. Do not do this if you have blisters.  Do not break any blisters if you have them.  Keep all follow-up visits as told by your doctor. This is important. Preventing sunburn To keep from getting sunburned:  Try to stay out of the sun between 10 a.m. and 4 p.m. The sun is strongest during that time.  Put on sunscreen 30 minutes or more before you go out in the sun.  Use a sunscreen with an SPF of 15 or higher. If you will be in the sun for a long  time, think about using an SPF of 30 or higher. Use a sunscreen that protects against all of the sun's rays (broad-spectrum) and is water-resistant.  Put sunscreen on again: ? About every 2 hours while you are in the sun. ? More often if you are sweating a lot while you are in the sun. ? After you get wet from swimming or playing in water. ? Wear long sleeves, a hat, and sunglasses when you are outside. ? Talk with your doctor about medicines, herbs, and foods that can make you more sensitive to light. Avoid these, if possible. ? Do not use tanning beds.  Contact a doctor if:  You have a fever or chills.  Your symptoms do not get better with treatment.  Medicine does not help your pain.  Your burn gets more painful or swollen.  You have open blisters on your skin. Get help right away if:  You start to throw up (vomit).  You start to have watery poop (diarrhea).  You feel dizzy.  You pass out.  You have a very bad headache or you feel confused.  You have very bad blisters.  You have pus or fluid coming from the blisters. Summary  Sunburn is damage to the skin that is caused by being in the sun too much.  Do not put ice on your sunburn. Try taking a cool bath or putting a cool, wet cloth (cool compress) on your skin. This may help with pain.  Do not break any blisters if you have them.  Put on sunscreen 30 minutes or more before you go out in the sun. This can help you to not get sunburned. This information is not intended to replace advice given to you by your health care provider. Make sure you discuss any questions you have with your health care provider. Document Released: 04/05/2011 Document Revised: 10/19/2016 Document Reviewed: 10/19/2016 Elsevier Interactive Patient Education  2019 [employer].

## 2018-12-10 ENCOUNTER — Encounter: Payer: Self-pay | Admitting: Plastic Surgery

## 2018-12-10 ENCOUNTER — Other Ambulatory Visit: Payer: Self-pay

## 2018-12-10 ENCOUNTER — Ambulatory Visit (INDEPENDENT_AMBULATORY_CARE_PROVIDER_SITE_OTHER): Payer: Self-pay | Admitting: Plastic Surgery

## 2018-12-10 VITALS — BP 114/74 | HR 68 | Temp 98.4°F

## 2018-12-10 DIAGNOSIS — Z719 Counseling, unspecified: Secondary | ICD-10-CM

## 2018-12-10 NOTE — Progress Notes (Signed)
Botulinum Toxin Procedure Note  Procedure: Cosmetic botulinum toxin  Pre-operative Diagnosis: Dynamic rhytides  Post-operative Diagnosis: Same  Complications:  None  Brief history: The patient desires botulinum toxin injection of her forehead. I discussed with the patient this proposed procedure of botulinum toxin injections, which is customized depending on the particular needs of the patient. It is performed on facial rhytids as a temporary correction. The alternatives were discussed with the patient. The risks were addressed including bleeding, scarring, infection, damage to deeper structures, asymmetry, and chronic pain, which may occur infrequently after a procedure. The individual's choice to undergo a surgical procedure is based on the comparison of risks to potential benefits. Other risks include unsatisfactory results, brow ptosis, eyelid ptosis, allergic reaction, temporary paralysis, which should go away with time, bruising, blurring disturbances and delayed healing. Botulinum toxin injections do not arrest the aging process or produce permanent tightening of the eyelid.  Operative intervention maybe necessary to maintain the results of a blepharoplasty or botulinum toxin. The patient understands and wishes to proceed. An informed consent was signed and informational brochures given to her prior to the procedure.  Procedure: The area was prepped with alcohol and dried with a clean gauze. Using a clean technique, the botulinum toxin was diluted with 1.25 cc of preservative-free normal saline which was slowly injected with an 18 gauge needle in a tuberculin syringes.  A 32 gauge needles were then used to inject the botulinum toxin. This mixture allow for an aliquot of 5 units per 0.1 cc in each injection site.    Subsequently the mixture was injected in the glabellar and forehead area with preservation of the temporal branch to the lateral eyebrow as well as into each lateral canthal area  beginning from the lateral orbital rim medial to the zygomaticus major in 3 separate areas. A total of 20 Units of botulinum toxin was used. The forehead and glabellar area was injected with care to inject intramuscular only while holding pressure on the supratrochlear vessels in each area during each injection on either side of the medial corrugators. The injection proceeded vertically superiorly to the medial 2/3 of the frontalis muscle and superior 2/3 of the lateral frontalis, again with preservation of the frontal branch.  No complications were noted. Light pressure was held for 5 minutes. She was instructed explicitly in post-operative care.  Botox LOT:  [id number] C2 EXP:  6/22

## 2019-01-13 ENCOUNTER — Encounter: Payer: Self-pay | Admitting: Family Medicine

## 2019-01-13 DIAGNOSIS — R238 Other skin changes: Secondary | ICD-10-CM

## 2019-01-13 DIAGNOSIS — R233 Spontaneous ecchymoses: Secondary | ICD-10-CM

## 2019-01-14 NOTE — Telephone Encounter (Signed)
Pt aware of Mychart message. Called pt to make sure she received message about labs and she did. No further action needed.

## 2019-01-15 ENCOUNTER — Ambulatory Visit (INDEPENDENT_AMBULATORY_CARE_PROVIDER_SITE_OTHER): Payer: No Typology Code available for payment source

## 2019-01-15 ENCOUNTER — Other Ambulatory Visit: Payer: Self-pay

## 2019-01-15 DIAGNOSIS — R233 Spontaneous ecchymoses: Secondary | ICD-10-CM

## 2019-01-15 DIAGNOSIS — R238 Other skin changes: Secondary | ICD-10-CM

## 2019-01-15 LAB — CBC WITH DIFFERENTIAL/PLATELET
Basophils Absolute: 0 10*3/uL (ref 0.0–0.1)
Basophils Relative: 0.3 % (ref 0.0–3.0)
Eosinophils Absolute: 0 10*3/uL (ref 0.0–0.7)
Eosinophils Relative: 0.3 % (ref 0.0–5.0)
HCT: 40.4 % (ref 36.0–46.0)
Hemoglobin: 13.6 g/dL (ref 12.0–15.0)
Lymphocytes Relative: 22.4 % (ref 12.0–46.0)
Lymphs Abs: 1.5 10*3/uL (ref 0.7–4.0)
MCHC: 33.6 g/dL (ref 30.0–36.0)
MCV: 100 fl (ref 78.0–100.0)
Monocytes Absolute: 0.4 10*3/uL (ref 0.1–1.0)
Monocytes Relative: 5.3 % (ref 3.0–12.0)
Neutro Abs: 4.9 10*3/uL (ref 1.4–7.7)
Neutrophils Relative %: 71.7 % (ref 43.0–77.0)
Platelets: 257 10*3/uL (ref 150.0–400.0)
RBC: 4.04 Mil/uL (ref 3.87–5.11)
RDW: 13.5 % (ref 11.5–15.5)
WBC: 6.8 10*3/uL (ref 4.0–10.5)

## 2019-01-15 NOTE — Addendum Note (Signed)
Addended by: Jasper Loser on: 01/15/2019 08:50 AM   Modules accepted: Orders

## 2019-02-24 ENCOUNTER — Other Ambulatory Visit: Payer: Self-pay

## 2019-02-26 MED FILL — FLUTICASONE PROP 50 MCG SPR: 50 | 30 days supply | Qty: 16 | Fill #1

## 2019-02-27 ENCOUNTER — Encounter: Payer: Self-pay | Admitting: Family Medicine

## 2019-03-06 ENCOUNTER — Ambulatory Visit (INDEPENDENT_AMBULATORY_CARE_PROVIDER_SITE_OTHER): Payer: Self-pay | Admitting: Plastic Surgery

## 2019-03-06 ENCOUNTER — Other Ambulatory Visit: Payer: Self-pay

## 2019-03-06 ENCOUNTER — Encounter: Payer: Self-pay | Admitting: Plastic Surgery

## 2019-03-06 VITALS — BP 114/76 | HR 80 | Temp 98.2°F | Ht 65.0 in | Wt 143.2 lb

## 2019-03-06 DIAGNOSIS — Z719 Counseling, unspecified: Secondary | ICD-10-CM

## 2019-03-06 NOTE — Progress Notes (Signed)
Botulinum Toxin  Procedure Note  Procedure: Cosmetic botulinum toxin   Pre-operative Diagnosis: Dynamic rhytides   Post-operative Diagnosis: Same  Complications:  None  Brief history: The patient desires botulinum toxin injection of her forehead. I discussed with the patient this proposed procedure of botulinum toxin injections, which is customized depending on the particular needs of the patient. It is performed on facial rhytids as a temporary correction. The alternatives were discussed with the patient. The risks were addressed including bleeding, scarring, infection, damage to deeper structures, asymmetry, and chronic pain, which may occur infrequently after a procedure. The individual's choice to undergo a surgical procedure is based on the comparison of risks to potential benefits. Other risks include unsatisfactory results, brow ptosis, eyelid ptosis, allergic reaction, temporary paralysis, which should go away with time, bruising, blurring disturbances and delayed healing. Botulinum toxin injections do not arrest the aging process or produce permanent tightening of the eyelid.  Operative intervention maybe necessary to maintain the results of a blepharoplasty or botulinum toxin. The patient understands and wishes to proceed. An informed consent was signed and informational brochures given to her prior to the procedure.  Procedure: The area was prepped with alcohol and dried with a clean gauze. Using a clean technique, the botulinum toxin was diluted with 1.25 cc of preservative-free normal saline which was slowly injected with an 18 gauge needle in a tuberculin syringes.  A 32 gauge needles were then used to inject the botulinum toxin. This mixture allow for an aliquot of 5 units per 0.1 cc in each injection site.    Subsequently the mixture was injected in the glabellar and forehead area with preservation of the temporal branch to the lateral eyebrow as well as into each lateral canthal area  beginning from the lateral orbital rim medial to the zygomaticus major in 3 separate areas. A total of 20 Units of botulinum toxin was used. The forehead and glabellar area was injected with care to inject intramuscular only while holding pressure on the supratrochlear vessels in each area during each injection on either side of the medial corrugators. The injection proceeded vertically superiorly to the medial 2/3 of the frontalis muscle and superior 2/3 of the lateral frontalis, again with preservation of the frontal branch.  No complications were noted. Light pressure was held for 5 minutes. She was instructed explicitly in post-operative care.  Botox LOT:  [id number] C2 EXP:  1/23

## 2019-03-07 ENCOUNTER — Encounter: Payer: Self-pay | Admitting: Family Medicine

## 2019-03-07 ENCOUNTER — Ambulatory Visit: Payer: Self-pay

## 2019-03-07 ENCOUNTER — Ambulatory Visit: Payer: No Typology Code available for payment source | Admitting: Family Medicine

## 2019-03-07 VITALS — BP 126/68 | HR 74 | Ht 65.0 in | Wt 145.0 lb

## 2019-03-07 DIAGNOSIS — G8929 Other chronic pain: Secondary | ICD-10-CM

## 2019-03-07 DIAGNOSIS — M999 Biomechanical lesion, unspecified: Secondary | ICD-10-CM | POA: Diagnosis not present

## 2019-03-07 DIAGNOSIS — M25561 Pain in right knee: Secondary | ICD-10-CM

## 2019-03-07 DIAGNOSIS — M9904 Segmental and somatic dysfunction of sacral region: Secondary | ICD-10-CM

## 2019-03-07 MED ORDER — VITAMIN D (ERGOCALCIFEROL) 1.25 MG (50000 UNIT) PO CAPS: 50000.0000 [IU] | ORAL_CAPSULE | ORAL | 0 refills | Status: DC

## 2019-03-07 MED FILL — VIT D2 1.25 MG (50,000 UNIT: 1.25 MG | 84 days supply | Qty: 12 | Fill #0

## 2019-03-07 NOTE — Assessment & Plan Note (Signed)
Decision today to treat with OMT was based on Physical Exam  After verbal consent patient was treated with HVLA, ME, FPR techniques in  thoracic, lumbar and sacral areas  Patient tolerated the procedure well with improvement in symptoms  Patient given exercises, stretches and lifestyle modifications  See medications in patient instructions if given  Patient will follow up in 4-8 weeks 

## 2019-03-07 NOTE — Patient Instructions (Signed)
Good to see you Once weekly vitamin D for 12 weeks. Run 3 times a week for 30 mins Week -  1 min run 1 min walk Week 2 - 2 min run 2 min walk Etc. Increase each week Exercise 3 times a week Foot would do better in [place] or [place] carbon x  See me again in 6-8 weeks

## 2019-03-07 NOTE — Assessment & Plan Note (Signed)
Patient's ultrasound was fairly unremarkable same with team physical exam.  More concern for the possibility of a stress reaction.  We discussed with patient's vitamin D deficiency this could be contributing.  Discussed icing regimen and home exercises.  Discussed which activities of doing which wants to avoid.  Patient will look into different shoes.  Follow-up again in 4 to 8 weeks

## 2019-03-07 NOTE — Progress Notes (Signed)
[facility] [place] [address] [place], [place] [address] Phone: [phone] Subjective:   I Megan Mcmillan am serving as a Education administrator for Dr. Hulan Saas.   CC: Back, hip, knee injury knee pain OEV:[id number]   11/05/2018 Patient given injection and tolerated the procedure well.  Discussed icing regimen and home exercise.  Discussed which activities of doing which wants to avoid.  Increase activity slowly.  Gabapentin and Zanaflex given at night.  Follow-up again 4 to 8 weeks  03/07/2019 Megan Mcmillan is a 43 y.o. female coming in with complaint of hip pain. States she is not feeling well. Started running and it has caused right knee pain. Ran 3 of 6 miles. Afterwards she had trouble walking.  Patient states that it is fairly difficult to even walk sometimes.  Patient states that the pain is increased recently.  Rates the severity of pain is 7 out of 10     Past Medical History:  Diagnosis Date  . Migraine 08/19/2014   No recurrence after birth of son. Previously on flexeril.     Past Surgical History:  Procedure Laterality Date  . CERVICAL CONIZATION W/BX  1997   cold knife, no abnormal paps since.   Marland Kitchen VAGINAL DELIVERY  1998   Social History   Socioeconomic History  . Marital status: Married    Spouse name: Not on file  . Number of children: Not on file  . Years of education: Not on file  . Highest education level: Not on file  Occupational History  . Not on file  Social Needs  . Financial resource strain: Not on file  . Food insecurity    Worry: Not on file    Inability: Not on file  . Transportation needs    Medical: Not on file    Non-medical: Not on file  Tobacco Use  . Smoking status: Former Smoker    Packs/day: 1.00    Years: 21.00    Pack years: 21.00    Types: Cigarettes    Quit date: 11/06/2010    Years since quitting: 8.3  . Smokeless tobacco: Never Used  Substance and Sexual Activity  . Alcohol use: Yes    Alcohol/week: 5.0  standard drinks    Types: 5 Standard drinks or equivalent per week  . Drug use: No  . Sexual activity: Not on file  Lifestyle  . Physical activity    Days per week: Not on file    Minutes per session: Not on file  . Stress: Not on file  Relationships  . Social Herbalist on phone: Not on file    Gets together: Not on file    Attends religious service: Not on file    Active member of club or organization: Not on file    Attends meetings of clubs or organizations: Not on file    Relationship status: Not on file  Other Topics Concern  . Not on file  Social History Narrative   Married 2015. Son 2993 [name]- also patient of Dr. Yong Channel.     Finished HS.       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       Hobbies: football, reading, cooking, enjoys cleaning      Allergies  Allergen Reactions  . Latex     swelling   Family History  Problem Relation Age of Onset  . Hypertension Mother   . Depression Mother        "  depression in entire family"  . Heart attack Father 41       smoked marijuana for many years  . Hypertension Father   . Depression Father   . Breast cancer Maternal Grandmother        47s  . Diabetes Maternal Grandmother   . Dementia Maternal Grandmother       Current Outpatient Medications (Respiratory):  .  fluticasone (FLONASE) 50 MCG/ACT nasal spray, Place 2 sprays into both nostrils daily.    Current Outpatient Medications (Other):  .  bimatoprost (LATISSE) 0.03 % ophthalmic solution, Place into both eyes at bedtime. Place one drop on applicator and apply evenly along the skin of the upper eyelid at base of eyelashes once daily at bedtime; repeat procedure for second eye (use a clean applicator). .  clobetasol cream (TEMOVATE) 3.14 %, Apply 1 application topically 2 (two) times daily. Marland Kitchen  gabapentin (NEURONTIN) 100 MG capsule, Take 1 capsule (100 mg total) by mouth at bedtime. .  Vitamin D, Ergocalciferol,  (DRISDOL) 1.25 MG (50000 UT) CAPS capsule, Take 1 capsule (50,000 Units total) by mouth every 7 (seven) days.    Past medical history, social, surgical and family history all reviewed in electronic medical record.  No pertanent information unless stated regarding to the chief complaint.   Review of Systems:  No headache, visual changes, nausea, vomiting, diarrhea, constipation, dizziness, abdominal pain, skin rash, fevers, chills, night sweats, weight loss, swollen lymph nodes, body aches, joint swelling, , chest pain, shortness of breath, mood changes.  Positive muscle aches  Objective  Blood pressure 126/68, pulse 74, height 5\' 5"  (1.651 m), weight 145 lb (65.8 kg), SpO2 98 %.    General: No apparent distress alert and oriented x3 mood and affect normal, dressed appropriately.  HEENT: Pupils equal, extraocular movements intact  Respiratory: Patient's speak in full sentences and does not appear short of breath  Cardiovascular: No lower extremity edema, non tender, no erythema  Skin: Warm dry intact with no signs of infection or rash on extremities or on axial skeleton.  Abdomen: Soft nontender  Neuro: Cranial nerves II through XII are intact, neurovascularly intact in all extremities with 2+ DTRs and 2+ pulses.  Lymph: No lymphadenopathy of posterior or anterior cervical chain or axillae bilaterally.  Gait mild antalgic MSK:  Non tender with full range of motion and good stability and symmetric strength and tone of shoulders, elbows, wrist,  and ankles bilaterally.  Right knee exam shows the patient does have some tenderness to palpation over the medial joint line.  Patient does have mild discomfort with more of the McMurray's.  Near full range of motion though.  Mild crepitus noted of the patellofemoral joint.  Hip exam shows near full range of motion but does have some pain with a Corky Sox test.  Negative straight leg test.  Limited musculoskeletal ultrasound was performed and interpreted  by Megan Mcmillan  Limited ultrasound of patient's right knee shows the patient does have what appears to be cortical irregularity of the tibia on the medial aspect.  No true cortical defects are noted.  Patient meniscus appears to be unremarkable.  No significant arthritic changes.  Osteopathic findings  C6 flexed rotated and side bent left T3 extended rotated and side bent right inhaled third rib T9 extended rotated and side bent left L2 flexed rotated and side bent right Sacrum right on right    Impression and Recommendations:     This case required medical decision making of moderate complexity.  The above documentation has been reviewed and is accurate and complete Megan Pulley, DO       Note: This dictation was prepared with Dragon dictation along with smaller phrase technology. Any transcriptional errors that result from this process are unintentional.

## 2019-03-07 NOTE — Assessment & Plan Note (Signed)
Patient has had some mild pain with the sacroiliac but has responded well to manipulation.  Continue the conservative therapy with core strengthening.  Patient has done relatively well overall.  Follow-up again in 4 to 8 weeks

## 2019-03-18 ENCOUNTER — Ambulatory Visit (INDEPENDENT_AMBULATORY_CARE_PROVIDER_SITE_OTHER): Payer: Self-pay

## 2019-03-18 ENCOUNTER — Other Ambulatory Visit: Payer: Self-pay

## 2019-03-18 VITALS — BP 112/77 | HR 75 | Temp 97.8°F

## 2019-03-18 DIAGNOSIS — Z719 Counseling, unspecified: Secondary | ICD-10-CM

## 2019-04-15 ENCOUNTER — Other Ambulatory Visit: Payer: No Typology Code available for payment source

## 2019-04-21 ENCOUNTER — Telehealth: Payer: Self-pay

## 2019-04-21 NOTE — Telephone Encounter (Signed)

## 2019-04-22 ENCOUNTER — Other Ambulatory Visit: Payer: Self-pay

## 2019-04-22 ENCOUNTER — Ambulatory Visit (INDEPENDENT_AMBULATORY_CARE_PROVIDER_SITE_OTHER): Payer: Self-pay

## 2019-04-22 VITALS — BP 123/77 | HR 64 | Temp 98.4°F

## 2019-04-22 DIAGNOSIS — Z719 Counseling, unspecified: Secondary | ICD-10-CM

## 2019-04-22 NOTE — Progress Notes (Signed)
Botulinum Toxin Procedure Note  Procedure: Cosmetic botulinum toxin   Pre-operative Diagnosis: Dynamic rhytides  Post-operative Diagnosis: Same  Complications:  None  Brief history: The patient desires botulinum toxin injection of her forehead. I discussed with the patient this proposed procedure of botulinum toxin injections, which is customized depending on the particular needs of the patient. It is performed on facial rhytids as a temporary correction. The alternatives were discussed with the patient. The risks were addressed including bleeding, scarring, infection, damage to deeper structures, asymmetry, and chronic pain, which may occur infrequently after a procedure. The individual's choice to undergo a surgical procedure is based on the comparison of risks to potential benefits. Other risks include unsatisfactory results, brow ptosis, eyelid ptosis, allergic reaction, temporary paralysis, which should go away with time, bruising, blurring disturbances and delayed healing. Botulinum toxin injections do not arrest the aging process or produce permanent tightening of the eyelid.  Operative intervention maybe necessary to maintain the results of a blepharoplasty or botulinum toxin. The patient understands and wishes to proceed. An informed consent was signed and informational brochures given to her prior to the procedure.  Procedure: The area was prepped with alcohol and dried with a clean gauze. Using a clean technique, the botulinum toxin was diluted with preservative-free normal saline which was slowly injected with an 18 gauge needle in a tuberculin syringes.  A 32 gauge needles were then used to inject the botulinum toxin. This mixture allow for an aliquot of 5 units equivalents per 0.1 cc in each injection site.    Subsequently the mixture was injected in the glabellar and forehead area with preservation of the temporal branch to the lateral eyebrow as well as into each lateral canthal area  beginning from the lateral orbital rim medial to the zygomaticus major in 3 separate areas. A total of 5 Units of botulinum toxin was used. The forehead and glabellar area was injected with care to inject intramuscular only while holding pressure on the supratrochlear vessels in each area during each injection on either side of the medial corrugators. The injection proceeded vertically superiorly to the medial 2/3 of the frontalis muscle and superior 2/3 of the lateral frontalis, again with preservation of the frontal branch.  No complications were noted. Light pressure was held for 5 minutes. She was instructed explicitly in post-operative care.  Dysport LOT:  [id number]

## 2019-04-24 NOTE — Progress Notes (Signed)
Preoperative Dx: leg veins  Postoperative Dx:  same  Procedure: laser to legs   Anesthesia: none  Description of Procedure:  Risks and complications were explained to the patient. Consent was confirmed and signed. Time out was called and all information was confirmed to be correct. The area  area was prepped with alcohol and wiped dry. The NdYag laser was set . The leg veins were lasered. The patient tolerated the procedure well and there were no complications. The patient is to follow up in 4 weeks.

## 2019-05-01 ENCOUNTER — Encounter: Payer: Self-pay | Admitting: Family Medicine

## 2019-05-01 ENCOUNTER — Ambulatory Visit: Payer: No Typology Code available for payment source | Admitting: Family Medicine

## 2019-05-05 ENCOUNTER — Other Ambulatory Visit: Payer: Self-pay

## 2019-05-05 NOTE — Progress Notes (Signed)
[facility] [place],  [id number] Phone: [phone] Subjective:     CC: Neck and back pain follow-up  [id number]   03/07/2019 Megan Mcmillan is a 43 y.o. female coming in with complaint of Patient's ultrasound was fairly unremarkable same with team physical exam.  More concern for the possibility of a stress reaction.  We discussed with patient's vitamin D deficiency this could be contributing.  Discussed icing regimen and home exercises.  Discussed which activities of doing which wants to avoid.  Patient will look into different shoes.  Follow-up again in 4 to 8 weeks  Patient has had some mild pain with the sacroiliac but has responded well to manipulation.  Continue the conservative therapy with core strengthening.  Patient has done relatively well overall.  Follow-up again in 4 to 8 weeks  Update 05/06/2019 Has not been having any SI joint pain or knee pain. Is having some cervical spine pain today. Has been working out more and attributes her decrease in pain to her workouts.     Past Medical History:  Diagnosis Date  . Migraine 08/19/2014   No recurrence after birth of son. Previously on flexeril.     Past Surgical History:  Procedure Laterality Date  . CERVICAL CONIZATION W/BX  1997   cold knife, no abnormal paps since.   Marland Kitchen VAGINAL DELIVERY  1998   Social History   Socioeconomic History  . Marital status: Married    Spouse name: Not on file  . Number of children: Not on file  . Years of education: Not on file  . Highest education level: Not on file  Occupational History  . Not on file  Social Needs  . Financial resource strain: Not on file  . Food insecurity    Worry: Not on file    Inability: Not on file  . Transportation needs    Medical: Not on file    Non-medical: Not on file  Tobacco Use  . Smoking status: Former Smoker    Packs/day: 1.00    Years: 21.00    Pack years: 21.00    Types: Cigarettes   Quit date: 11/06/2010    Years since quitting: 8.4  . Smokeless tobacco: Never Used  Substance and Sexual Activity  . Alcohol use: Yes    Alcohol/week: 5.0 standard drinks    Types: 5 Standard drinks or equivalent per week  . Drug use: No  . Sexual activity: Not on file  Lifestyle  . Physical activity    Days per week: Not on file    Minutes per session: Not on file  . Stress: Not on file  Relationships  . Social Herbalist on phone: Not on file    Gets together: Not on file    Attends religious service: Not on file    Active member of club or organization: Not on file    Attends meetings of clubs or organizations: Not on file    Relationship status: Not on file  Other Topics Concern  . Not on file  Social History Narrative   Married 2015. Son [id number] [name]- also patient of Dr. Yong Channel.     Finished HS.       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       Hobbies: football, reading, cooking, enjoys cleaning      Allergies  Allergen Reactions  . Latex     swelling  Family History  Problem Relation Age of Onset  . Hypertension Mother   . Depression Mother        "depression in entire family"  . Heart attack Father 41       smoked marijuana for many years  . Hypertension Father   . Depression Father   . Breast cancer Maternal Grandmother        45s  . Diabetes Maternal Grandmother   . Dementia Maternal Grandmother       Current Outpatient Medications (Respiratory):  .  fluticasone (FLONASE) 50 MCG/ACT nasal spray, Place 2 sprays into both nostrils daily.    Current Outpatient Medications (Other):  .  bimatoprost (LATISSE) 0.03 % ophthalmic solution, Place into both eyes at bedtime. Place one drop on applicator and apply evenly along the skin of the upper eyelid at base of eyelashes once daily at bedtime; repeat procedure for second eye (use a clean applicator). .  clobetasol cream (TEMOVATE) [id number] %, Apply 1 application  topically 2 (two) times daily. Marland Kitchen  gabapentin (NEURONTIN) 100 MG capsule, Take 1 capsule (100 mg total) by mouth at bedtime. .  Vitamin D, Ergocalciferol, (DRISDOL) 1.25 MG (50000 UT) CAPS capsule, Take 1 capsule (50,000 Units total) by mouth every 7 (seven) days.    Past medical history, social, surgical and family history all reviewed in electronic medical record.  No pertanent information unless stated regarding to the chief complaint.   Review of Systems:  No headache, visual changes, nausea, vomiting, diarrhea, constipation, dizziness, abdominal pain, skin rash, fevers, chills, night sweats, weight loss, swollen lymph nodes, body aches, joint swelling,  chest pain, shortness of breath, mood changes.  Positive muscle aches  Objective  There were no vitals taken for this visit.    General: No apparent distress alert and oriented x3 mood and affect normal, dressed appropriately.  HEENT: Pupils equal, extraocular movements intact  Respiratory: Patient's speak in full sentences and does not appear short of breath  Cardiovascular: No lower extremity edema, non tender, no erythema  Skin: Warm dry intact with no signs of infection or rash on extremities or on axial skeleton.  Abdomen: Soft nontender  Neuro: Cranial nerves II through XII are intact, neurovascularly intact in all extremities with 2+ DTRs and 2+ pulses.  Lymph: No lymphadenopathy of posterior or anterior cervical chain or axillae bilaterally.  Gait normal with good balance and coordination.  MSK:  Non tender with full range of motion and good stability and symmetric strength and tone of shoulders, elbows, wrist, hip and ankles bilaterally.  Knee: Normal to inspection with no erythema or effusion or obvious bony abnormalities. Palpation normal with no warmth, joint line tenderness, patellar tenderness, or condyle tenderness. ROM full in flexion and extension and lower leg rotation. Ligaments with solid consistent endpoints  including ACL, PCL, LCL, MCL. Negative Mcmurray's, Apley's, and Thessalonian tests. Non painful patellar compression. Patellar glide without crepitus. Patellar and quadriceps tendons unremarkable. Hamstring and quadriceps strength is normal.  Neck: Inspection unremarkable. No palpable stepoffs. Negative Spurling's maneuver. Full neck range of motion Grip strength and sensation normal in bilateral hands Strength good C4 to T1 distribution No sensory change to C4 to T1 Negative Hoffman sign bilaterally Reflexes normal  Back Exam:  Inspection: Unremarkable  Motion: Flexion 45 deg, Extension 45 deg, Side Bending to 45 deg bilaterally,  Rotation to 45 deg bilaterally  SLR laying: Negative  XSLR laying: Negative  Palpable tenderness: Very mild tightness around the left sacroiliac joint. FABER: negative. Sensory  change: Gross sensation intact to all lumbar and sacral dermatomes.  Reflexes: 2+ at both patellar tendons, 2+ at achilles tendons, Babinski's downgoing.  Strength at foot  Plantar-flexion: 5/5 Dorsi-flexion: 5/5 Eversion: 5/5 Inversion: 5/5  Leg strength  Quad: 5/5 Hamstring: 5/5 Hip flexor: 5/5 Hip abductors: 5/5  Gait unremarkable.  Osteopathic findings  C2 flexed rotated and side bent right T3 extended rotated and side bent right inhaled third rib T9 extended rotated and side bent left L2 flexed rotated and side bent right Sacrum left on left    Impression and Recommendations:     This case required medical decision making of moderate complexity. The above documentation has been reviewed and is accurate and complete Lyndal Pulley, DO       Note: This dictation was prepared with Dragon dictation along with smaller phrase technology. Any transcriptional errors that result from this process are unintentional.

## 2019-05-06 ENCOUNTER — Ambulatory Visit: Payer: No Typology Code available for payment source | Admitting: Family Medicine

## 2019-05-06 ENCOUNTER — Encounter: Payer: Self-pay | Admitting: Family Medicine

## 2019-05-06 VITALS — BP 124/82 | HR 85 | Ht 65.0 in | Wt 143.0 lb

## 2019-05-06 DIAGNOSIS — M999 Biomechanical lesion, unspecified: Secondary | ICD-10-CM

## 2019-05-06 DIAGNOSIS — M9904 Segmental and somatic dysfunction of sacral region: Secondary | ICD-10-CM

## 2019-05-06 NOTE — Assessment & Plan Note (Signed)
Responding very well to conservative therapy.  Discussed posture and ergonomics again at great length.  Responds well.  New patient.  Patient is doing better with the workout routine with her new gym.  Follow-up again 2 months

## 2019-05-06 NOTE — Patient Instructions (Signed)
Doing great Work on posture at the computer See me in 7-8 weeks

## 2019-05-06 NOTE — Assessment & Plan Note (Signed)
Decision today to treat with OMT was based on Physical Exam  After verbal consent patient was treated with HVLA, ME, FPR techniques in cervical, thoracic, lumbar and sacral areas  Patient tolerated the procedure well with improvement in symptoms  Patient given exercises, stretches and lifestyle modifications  See medications in patient instructions if given  Patient will follow up in 4-8 weeks 

## 2019-05-09 ENCOUNTER — Ambulatory Visit: Payer: No Typology Code available for payment source | Admitting: Family Medicine

## 2019-05-16 ENCOUNTER — Encounter: Payer: Self-pay | Admitting: Family Medicine

## 2019-05-16 MED ORDER — TRIAMTERENE-HCTZ 37.5-25 MG PO TABS: 0.5000 | ORAL_TABLET | Freq: Every day | ORAL | 1 refills | Status: DC | PRN

## 2019-05-16 MED FILL — TRIAMTERENE-HCTZ 37.5-25 MG: 37.5-25 | 30 days supply | Qty: 30 | Fill #0

## 2019-05-23 ENCOUNTER — Encounter: Payer: Self-pay | Admitting: Family Medicine

## 2019-05-23 ENCOUNTER — Other Ambulatory Visit: Payer: Self-pay

## 2019-05-23 ENCOUNTER — Ambulatory Visit (INDEPENDENT_AMBULATORY_CARE_PROVIDER_SITE_OTHER): Payer: No Typology Code available for payment source | Admitting: Family Medicine

## 2019-05-23 VITALS — BP 118/78 | HR 74 | Temp 97.4°F | Ht 65.0 in | Wt 147.0 lb

## 2019-05-23 DIAGNOSIS — M25552 Pain in left hip: Secondary | ICD-10-CM

## 2019-05-23 NOTE — Progress Notes (Signed)
Musculoskeletal Exam  Patient: Megan Mcmillan DOB: [birth year]  DOS: 05/23/2019  SUBJECTIVE:  Chief Complaint:   Chief Complaint  Patient presents with  . Hip Pain    left    Megan Mcmillan is a 43 y.o.  female for evaluation and treatment of L hip pain.   Onset: 1 day ago. No inj or change in activity.  Location: feels deep inside Character:  uncomfortable  Progression of issue:  is unchanged Associated symptoms: Nml ROM, no bruising, swelling or redness Treatment: to date has been none.   Neurovascular symptoms: no  ROS: Musculoskeletal/Extremities: +L hip pain  Past Medical History:  Diagnosis Date  . Migraine 08/19/2014   No recurrence after birth of son. Previously on flexeril.      Objective: VITAL SIGNS: BP 118/78 (BP Location: Left Arm, Patient Position: Sitting, Cuff Size: Normal)   Pulse 74   Temp (!) 97.4 F (36.3 C) (Oral)   Ht 5' 5"  (1.651 m)   Wt 147 lb (66.7 kg)   SpO2 96%   BMI 24.46 kg/m  Constitutional: Well formed, well developed. No acute distress. Cardiovascular: Brisk cap refill Thorax & Lungs: No accessory muscle use Musculoskeletal: L hip.   Normal active range of motion: yes.   Normal passive range of motion: yes Tenderness to palpation: no Deformity: no Ecchymosis: no Tests positive: none Tests negative: log roll, [place], FABER/FADDIR Neurologic: Normal sensory function. No focal deficits noted. DTR's equal and symmetric in LE's. No clonus. Psychiatric: Normal mood. Age appropriate judgment and insight. Alert & oriented x 3.    PROCEDURE NOTE After discussing the procedure and risks, including but not limited to increased pain or stiffness and rarely nausea or dizziness, verbal consent was obtained.  Pre-procedure diagnosis: Somatic dysfunction Post-procedure diagnosis: Same Procedure: OMT  Regions treated include innominates, pubes, lumbar: MET/HVLA with the tissue response noted to be Improved.  The patient tolerated  the procedure well, and there were no complications noted.  The patient was warned of the possibility of increased pain or stiffness of up to 48 hours duration and was asked to call with any unexpected problems.   Assessment:  Hip discomfort, left  Plan: Good tissue response to OMT. Innom's more balanced. Home stretches/exercises. She does note some catching which makes me think possible labral involvement, but had good ROM on exam. Will see how she does.  F/u prn. The patient voiced understanding and agreement to the plan.   [place], DO 05/23/19  2:38 PM

## 2019-05-23 NOTE — Patient Instructions (Signed)
If things get stuck when you move your hip, let me know.  Hip Exercises It is normal to feel mild stretching, pulling, tightness, or discomfort as you do these exercises, but you should stop right away if you feel sudden pain or your pain gets worse.  STRETCHING AND RANGE OF MOTION EXERCISES These exercises warm up your muscles and joints and improve the movement and flexibility of your hip. These exercises also help to relieve pain, numbness, and tingling. Exercise A: Hamstrings, Supine  1. Lie on your back. 2. Loop a belt or towel over the ball of your left / rightfoot. The ball of your foot is on the walking surface, right under your toes. 3. Straighten your left / rightknee and slowly pull on the belt to raise your leg. ? Do not let your left / right knee bend while you do this. ? Keep your other leg flat on the floor. ? Raise the left / right leg until you feel a gentle stretch behind your left / right knee or thigh. 4. Hold this position for 30 seconds. 5. Slowly return your leg to the starting position. Repeat2 times. Complete this stretch 3 times per week. Exercise B: Hip Rotators  1. Lie on your back on a firm surface. 2. Hold your left / right knee with your left / right hand. Hold your ankle with your other hand. 3. Gently pull your left / right knee and rotate your lower leg toward your other shoulder. ? Pull until you feel a stretch in your buttocks. ? Keep your hips and shoulders firmly planted while you do this stretch. 4. Hold this position for 30 seconds. Repeat 2 times. Complete this stretch 3 times per week. Exercise C: V-Sit (Hamstrings and Adductors)  1. Sit on the floor with your legs extended in a large "V" shape. Keep your knees straight during this exercise. 2. Start with your head and chest upright, then bend at your waist to reach for your left foot (position A). You should feel a stretch in your right inner thigh. 3. Hold this position for 30 seconds.  Then slowly return to the upright position. 4. Bend at your waist to reach forward (position B). You should feel a stretch behind both of your thighs and knees. 5. Hold this position for 30 seconds. Then slowly return to the upright position. 6. Bend at your waist to reach for your right foot (position C). You should feel a stretch in your left inner thigh. 7. Hold this position for 30 seconds. Then slowly return to the upright position. Repeat A, B, and C 2 times each. Complete this stretch 3 times per week. Exercise D: Lunge (Hip Flexors)  1. Place your left / right knee on the floor and bend your other knee so that is directly over your ankle. You should be half-kneeling. 2. Keep good posture with your head over your shoulders. 3. Tighten your buttocks to point your tailbone downward. This helps your back to keep from arching too much. 4. You should feel a gentle stretch in the front of your left / right thigh and hip. If you do not feel any resistance, slightly slide your other foot forward and then slowly lunge forward so your knee once again lines up over your ankle. 5. Make sure your tailbone continues to point downward. 6. Hold this position for 30 seconds. Repeat 2 times. Complete this stretch 3 times per week.  STRENGTHENING EXERCISES These exercises build strength and endurance in  your hip. Endurance is the ability to use your muscles for a long time, even after they get tired. Exercise E: Bridge (Hip Extensors)  1. Lie on your back on a firm surface with your knees bent and your feet flat on the floor. 2. Tighten your buttocks muscles and lift your bottom off the floor until the trunk of your body is level with your thighs. ? Do not arch your back. ? You should feel the muscles working in your buttocks and the back of your thighs. If you do not feel these muscles, slide your feet 1-2 inches (2.5-5 cm) farther away from your buttocks. 3. Hold this position for 3 seconds. 4. Slowly  lower your hips to the starting position. Repeat for a total of 10 repetitions. 5. Let your muscles relax completely between repetitions. 6. If this exercise is too easy, try doing it with your arms crossed over your chest. Repeat 2 times. Complete this exercise 3 times per week. Exercise F: Straight Leg Raises - Hip Abductors  1. Lie on your side with your left / right leg in the top position. Lie so your head, shoulder, knee, and hip line up with each other. You may bend your bottom knee to help you balance. 2. Roll your hips slightly forward, so your hips are stacked directly over each other and your left / right knee is facing forward. 3. Leading with your heel, lift your top leg 4-6 inches (10-15 cm). You should feel the muscles in your outer hip lifting. ? Do not let your foot drift forward. ? Do not let your knee roll toward the ceiling. 4. Hold this position for 1 second. 5. Slowly return to the starting position. 6. Let your muscles relax completely between repetitions. Repeat for a total of 10 repetitions.  Repeat 2 times. Complete this exercise 3 times per week. Exercise G: Straight Leg Raises - Hip Adductors  1. Lie on your side with your left / right leg in the bottom position. Lie so your head, shoulder, knee, and hip line up. You may place your upper foot in front to help you balance. 2. Roll your hips slightly forward, so your hips are stacked directly over each other and your left / right knee is facing forward. 3. Tense the muscles in your inner thigh and lift your bottom leg 4-6 inches (10-15 cm). 4. Hold this position for 1 second. 5. Slowly return to the starting position. 6. Let your muscles relax completely between repetitions. Repeat for a total of 10 repetitions. Repeat 2 times. Complete this exercise 3 times per week. Exercise H: Straight Leg Raises - Quadriceps  1. Lie on your back with your left / right leg extended and your other knee bent. 2. Tense the muscles  in the front of your left / right thigh. When you do this, you should see your kneecap slide up or see increased dimpling just above your knee. 3. Tighten these muscles even more and raise your leg 4-6 inches (10-15 cm) off the floor. 4. Hold this position for 3 seconds. 5. Keep these muscles tense as you lower your leg. 6. Relax the muscles slowly and completely between repetitions. Repeat for a total of 10 repetitions. Repeat 2 times. Complete this exercise 3 times per week. Exercise I: Hip Abductors, Standing 1. Tie one end of a rubber exercise band or tubing to a secure surface, such as a table or pole. 2. Loop the other end of the band or tubing  around your left / right ankle. 3. Keeping your ankle with the band or tubing directly opposite of the secured end, step away until there is tension in the tubing or band. Hold onto a chair as needed for balance. 4. Lift your left / right leg out to your side. While you do this: ? Keep your back upright. ? Keep your shoulders over your hips. ? Keep your toes pointing forward. ? Make sure to use your hip muscles to lift your leg. Do not "throw" your leg or tip your body to lift your leg. 5. Hold this position for 1 second. 6. Slowly return to the starting position. Repeat for a total of 10 repetitions. Repeat 2 times. Complete this exercise 3 times per week. Exercise J: Squats (Quadriceps) 1. Stand in a door frame so your feet and knees are in line with the frame. You may place your hands on the frame for balance. 2. Slowly bend your knees and lower your hips like you are going to sit in a chair. ? Keep your lower legs in a straight-up-and-down position. ? Do not let your hips go lower than your knees. ? Do not bend your knees lower than told by your health care provider. ? If your hip pain increases, do not bend as low. 3. Hold this position for 1 second. 4. Slowly push with your legs to return to standing. Do not use your hands to pull  yourself to standing. Repeat for a total of 10 repetitions. Repeat 2 times. Complete this exercise 3 times per week. Make sure you discuss any questions you have with your health care provider. Document Released: 08/11/2005 Document Revised: 04/17/2016 Document Reviewed: 07/19/2015 Elsevier Interactive Patient Education  [employer].

## 2019-06-02 ENCOUNTER — Other Ambulatory Visit: Payer: No Typology Code available for payment source

## 2019-06-04 ENCOUNTER — Encounter: Payer: Self-pay | Admitting: Family Medicine

## 2019-06-04 ENCOUNTER — Other Ambulatory Visit: Payer: Self-pay

## 2019-06-04 MED ORDER — VITAMIN D (ERGOCALCIFEROL) 1.25 MG (50000 UNIT) PO CAPS: 50000.0000 [IU] | ORAL_CAPSULE | ORAL | 0 refills | Status: DC

## 2019-06-04 MED FILL — VIT D2 1.25 MG (50,000 UNIT: 1.25 MG | 84 days supply | Qty: 12 | Fill #0

## 2019-06-09 ENCOUNTER — Other Ambulatory Visit: Payer: No Typology Code available for payment source

## 2019-06-11 ENCOUNTER — Other Ambulatory Visit: Payer: Self-pay

## 2019-06-11 ENCOUNTER — Encounter: Payer: Self-pay | Admitting: Family Medicine

## 2019-06-11 ENCOUNTER — Ambulatory Visit (INDEPENDENT_AMBULATORY_CARE_PROVIDER_SITE_OTHER): Payer: No Typology Code available for payment source | Admitting: Family Medicine

## 2019-06-11 ENCOUNTER — Other Ambulatory Visit (INDEPENDENT_AMBULATORY_CARE_PROVIDER_SITE_OTHER): Payer: No Typology Code available for payment source

## 2019-06-11 DIAGNOSIS — R609 Edema, unspecified: Secondary | ICD-10-CM

## 2019-06-11 DIAGNOSIS — I872 Venous insufficiency (chronic) (peripheral): Secondary | ICD-10-CM | POA: Diagnosis not present

## 2019-06-11 LAB — COMPREHENSIVE METABOLIC PANEL
ALT: 26 U/L (ref 0–35)
AST: 24 U/L (ref 0–37)
Albumin: 4.6 g/dL (ref 3.5–5.2)
Alkaline Phosphatase: 54 U/L (ref 39–117)
BUN: 13 mg/dL (ref 6–23)
CO2: 31 mEq/L (ref 19–32)
Calcium: 9.8 mg/dL (ref 8.4–10.5)
Chloride: 100 mEq/L (ref 96–112)
Creatinine, Ser: 0.87 mg/dL (ref 0.40–1.20)
GFR: 70.47 mL/min (ref >60.00)
Glucose, Bld: 71 mg/dL (ref 70–99)
Potassium: 4.3 mEq/L (ref 3.5–5.1)
Sodium: 140 mEq/L (ref 135–145)
Total Bilirubin: 0.5 mg/dL (ref 0.2–1.2)
Total Protein: 7.4 g/dL (ref 6.0–8.3)

## 2019-06-11 LAB — TSH: TSH: 2.12 u[IU]/mL (ref 0.35–4.50)

## 2019-06-11 MED ORDER — FLUTICASONE PROPIONATE 50 MCG/ACT NA SUSP: 2.0000 | Freq: Every day | NASAL | 3 refills | Status: DC

## 2019-06-11 MED FILL — FLUTICASONE PROP 50 MCG SPR: 50 | 30 days supply | Qty: 16 | Fill #0

## 2019-06-11 NOTE — Progress Notes (Signed)
Phone [phone] In person visit   Subjective:   Megan Mcmillan is a 43 y.o. year old very pleasant female patient who presents for/with See problem oriented charting Chief Complaint  Patient presents with  . Follow-up  . leg swelling   ROS- No chest pain or shortness of breath. No headache or blurry vision.    Past Medical History-  Patient Active Problem List   Diagnosis Date Noted  . Venous insufficiency 06/11/2019    Priority: Medium  . History of squamous cell carcinoma 08/19/2014    Priority: Medium  . Seborrheic dermatitis 08/28/2017    Priority: Low  . Actinic keratosis 06/08/2015    Priority: Low  . Acromioclavicular sprain 04/29/2015    Priority: Low  . History of cervical cancer 08/19/2014    Priority: Low  . Vitamin D deficiency 08/19/2014    Priority: Low  . Former smoker 08/19/2014    Priority: Low  . Right knee pain 03/07/2019  . Encounter for counseling 11/22/2018  . Gluteal tendonitis of left buttock 11/05/2018  . Somatic dysfunction of left sacroiliac joint 10/24/2018  . Tendonitis 05/31/2018  . Olecranon bursitis of right elbow 05/31/2018  . Weakness of right hip 03/28/2018  . Nonallopathic lesion of thoracic region 03/28/2018  . Nonallopathic lesion of lumbosacral region 03/28/2018  . Nonallopathic lesion of sacral region 03/28/2018    Medications- reviewed and updated Current Outpatient Medications  Medication Sig Dispense Refill  . bimatoprost (LATISSE) 0.03 % ophthalmic solution Place into both eyes at bedtime. Place one drop on applicator and apply evenly along the skin of the upper eyelid at base of eyelashes once daily at bedtime; repeat procedure for second eye (use a clean applicator).    . fluticasone (FLONASE) 50 MCG/ACT nasal spray Place 2 sprays into both nostrils daily. 16 g 3  . Vitamin D, Ergocalciferol, (DRISDOL) 1.25 MG (50000 UT) CAPS capsule Take 1 capsule (50,000 Units total) by mouth every 7 (seven) days. 12 capsule 0  .  triamterene-hydrochlorothiazide (MAXZIDE-25) 37.5-25 MG tablet Take 0.5-1 tablets by mouth daily as needed (edema). (Patient not taking: Reported on 06/11/2019) 30 tablet 1   No current facility-administered medications for this visit.      Objective:  BP 118/78   Pulse 94   Temp 98.2 F (36.8 C)   Ht 5\' 5"  (1.651 m)   Wt 143 lb 3.2 oz (65 kg)   SpO2 98%   BMI 23.83 kg/m  Gen: NAD, resting comfortably  CV: RRR  Lungs: nonlabored, normal respiratory rate Abdomen: soft/nondistended  Ext: Trace edema left greater than right-most prominent left ankle but without tenderness No Baker's cyst noted Skin: warm, dry     Assessment and Plan  Leg Swelling S: Patient contact me via MyChart about a month ago on 05/16/2019.  In her note she stated that she has intermittent swelling in her ankles usually when she travels-had done an intense workout and swelling started that day.  Today she reports that primarily the swelling was around her left ankle had significant swelling.  She states usually would only swell with travel but she had not been travelling.  She had used Maxide previously and that was helpful-we refilled at that time and she states it helped.  Patient states earlier this week she started having swelling again in her left ankle-and yesterday began in the low right leg as well.  She has not tried the [facility] yet.Pt denies redness, tenderness and pain.  In further history taking today-patient has  had issues for years with venous insufficiency.PT on legs on veins in past. As CMA, used to use compression stockings.  She had smoked for 21 years previously  Prior to this history we had already ordered labs including CMP, TSH, D-dimer.  She had a CBC within 6 months so we did not repeat that.  In regards to possible DVT/PE we think risk is low but D-dimer would firmly rule this out.  She had no chest pain or shortness of breath Running 8 mph on treadmill this AM A/P: Strongly suspect venous  insufficiency as the cause.  Still pending blood work From [facility] " Lets see what bloodwork shows. Would get venous duplex if d dimer is high to rule out clot but if negative rules out clot.   I think this is related to baseline venous insufficiency. You can use maxzide if you would like. Compression stockings are also recommended to be worn during the day.   Can write rx for [facility] if needed- would target 20-30 mmhg " She is going to try to buy over-the-counter/through [facility] potentially  Recommended follow up: As needed for acute issue Future Appointments  Date Time Provider [place]  06/12/2019  1:00 PM Cox, Doroteo Bradford, RN [facility] None  06/19/2019  8:15 AM Lyndal Pulley, DO [facility]  06/27/2019  2:15 PM Dillingham, Loel Lofty, DO [facility] None   Lab/Order associations:   ICD-10-CM   1. Venous insufficiency  I87.2    Refill provided for chronic condition Meds ordered this encounter  Medications  . fluticasone (FLONASE) 50 MCG/ACT nasal spray    Sig: Place 2 sprays into both nostrils daily.    Dispense:  16 g    Refill:  3    Return precautions advised.  Garret Reddish, MD

## 2019-06-11 NOTE — Assessment & Plan Note (Signed)
Leg Swelling S: Patient contact me via MyChart about a month ago on 05/16/2019.  In her note she stated that she has intermittent swelling in her ankles usually when she travels-had done an intense workout and swelling started that day.  Today she reports that primarily the swelling was around her left ankle had significant swelling.  She states usually would only swell with travel but she had not been travelling.  She had used Maxide previously and that was helpful-we refilled at that time and she states it helped.  Patient states earlier this week she started having swelling again in her left ankle-and yesterday began in the low right leg as well.  She has not tried the [facility] yet.Pt denies redness, tenderness and pain.  In further history taking today-patient has had issues for years with venous insufficiency.PT on legs on veins in past. As CMA, used to use compression stockings.  She had smoked for 21 years previously  Prior to this history we had already ordered labs including CMP, TSH, D-dimer.  She had a CBC within 6 months so we did not repeat that.  In regards to possible DVT/PE we think risk is low but D-dimer would firmly rule this out.  She had no chest pain or shortness of breath Running 8 mph on treadmill this AM A/P: Strongly suspect venous insufficiency as the cause.  Still pending blood work From [facility] " Lets see what bloodwork shows. Would get venous duplex if d dimer is high to rule out clot but if negative rules out clot.   I think this is related to baseline venous insufficiency. You can use maxzide if you would like. Compression stockings are also recommended to be worn during the day.   Can write rx for [facility] if needed- would target 20-30 mmhg "

## 2019-06-11 NOTE — Patient Instructions (Addendum)
Health Maintenance Due  Topic Date Due  . INFLUENZA VACCINE -done at work 03/08/2019   Lets see what bloodwork shows. Would get venous duplex if d dimer is high to rule out clot but if negative rules out clot.   I think this is related to baseline venous insufficiency. You can use maxzide if you would like. Compression stockings are also recommended to be worn during the day.   Can write rx for [facility] if needed- would target 20-30 mmhg

## 2019-06-12 ENCOUNTER — Other Ambulatory Visit (INDEPENDENT_AMBULATORY_CARE_PROVIDER_SITE_OTHER): Payer: Self-pay

## 2019-06-12 LAB — D-DIMER, QUANTITATIVE: D-Dimer, Quant: 0.49 mcg/mL FEU (ref <0.50)

## 2019-06-19 ENCOUNTER — Ambulatory Visit: Payer: No Typology Code available for payment source | Admitting: Family Medicine

## 2019-06-27 ENCOUNTER — Other Ambulatory Visit: Payer: Self-pay

## 2019-06-27 ENCOUNTER — Ambulatory Visit (INDEPENDENT_AMBULATORY_CARE_PROVIDER_SITE_OTHER): Payer: Self-pay | Admitting: Plastic Surgery

## 2019-06-27 ENCOUNTER — Encounter: Payer: Self-pay | Admitting: Plastic Surgery

## 2019-06-27 VITALS — BP 131/79 | HR 71 | Temp 97.7°F

## 2019-06-27 DIAGNOSIS — Z719 Counseling, unspecified: Secondary | ICD-10-CM

## 2019-06-27 NOTE — Progress Notes (Signed)
Botulinum Toxin Procedure Note  Procedure: Cosmetic botulinum toxin   Pre-operative Diagnosis: Dynamic rhytides   Post-operative Diagnosis: Same  Complications:  None  Brief history: The patient desires botulinum toxin injection of her forehead. I discussed with the patient this proposed procedure of botulinum toxin injections, which is customized depending on the particular needs of the patient. It is performed on facial rhytids as a temporary correction. The alternatives were discussed with the patient. The risks were addressed including bleeding, scarring, infection, damage to deeper structures, asymmetry, and chronic pain, which may occur infrequently after a procedure. The individual's choice to undergo a surgical procedure is based on the comparison of risks to potential benefits. Other risks include unsatisfactory results, brow ptosis, eyelid ptosis, allergic reaction, temporary paralysis, which should go away with time, bruising, blurring disturbances and delayed healing. Botulinum toxin injections do not arrest the aging process or produce permanent tightening of the eyelid.  Operative intervention maybe necessary to maintain the results of a blepharoplasty or botulinum toxin. The patient understands and wishes to proceed. An informed consent was signed and informational brochures given to her prior to the procedure.  Procedure: The area was prepped with alcohol and dried with a clean gauze. Using a clean technique, the botulinum toxin was diluted with 1.25 cc of preservative-free normal saline which was slowly injected with an 18 gauge needle in a tuberculin syringes.  A 32 gauge needles were then used to inject the botulinum toxin. This mixture allow for an aliquot of 5 units per 0.1 cc in each injection site.    Subsequently the mixture was injected in the glabellar and forehead area with preservation of the temporal branch to the lateral eyebrow as well as into each lateral canthal area  beginning from the lateral orbital rim medial to the zygomaticus major in 3 separate areas. A total of 20 Units of botulinum toxin was used. The forehead and glabellar area was injected with care to inject intramuscular only while holding pressure on the supratrochlear vessels in each area during each injection on either side of the medial corrugators. The injection proceeded vertically superiorly to the medial 2/3 of the frontalis muscle and superior 2/3 of the lateral frontalis, again with preservation of the frontal branch.  No complications were noted. Light pressure was held for 5 minutes. She was instructed explicitly in post-operative care.  Botox LOT:  [id number] C2 EXP:  7/23

## 2019-07-14 ENCOUNTER — Other Ambulatory Visit: Payer: No Typology Code available for payment source

## 2019-07-16 ENCOUNTER — Other Ambulatory Visit: Payer: No Typology Code available for payment source

## 2019-07-21 ENCOUNTER — Other Ambulatory Visit: Payer: Self-pay

## 2019-08-13 ENCOUNTER — Encounter: Payer: Self-pay | Admitting: Family Medicine

## 2019-08-13 NOTE — Telephone Encounter (Signed)
Called pt and scheduled virtual same day appt

## 2019-08-14 ENCOUNTER — Ambulatory Visit (INDEPENDENT_AMBULATORY_CARE_PROVIDER_SITE_OTHER): Payer: No Typology Code available for payment source | Admitting: Family Medicine

## 2019-08-14 ENCOUNTER — Encounter: Payer: Self-pay | Admitting: Family Medicine

## 2019-08-14 VITALS — Temp 97.6°F | Ht 65.0 in | Wt 143.0 lb

## 2019-08-14 DIAGNOSIS — B9689 Other specified bacterial agents as the cause of diseases classified elsewhere: Secondary | ICD-10-CM | POA: Diagnosis not present

## 2019-08-14 DIAGNOSIS — U071 COVID-19: Secondary | ICD-10-CM

## 2019-08-14 DIAGNOSIS — J329 Chronic sinusitis, unspecified: Secondary | ICD-10-CM

## 2019-08-14 MED ORDER — FLUCONAZOLE 150 MG PO TABS: 150.0000 mg | ORAL_TABLET | Freq: Every day | ORAL | 0 refills | Status: DC

## 2019-08-14 MED ORDER — AMOXICILLIN-POT CLAVULANATE 875-125 MG PO TABS: 1.0000 | ORAL_TABLET | Freq: Two times a day (BID) | ORAL | 0 refills | Status: AC

## 2019-08-14 NOTE — Patient Instructions (Signed)
There are no preventive care reminders to display for this patient.  Depression screen [facility] 2/9 06/11/2019 10/03/2018 05/31/2018  Decreased Interest 0 0 0  Down, Depressed, Hopeless 0 0 0  PHQ - 2 Score 0 0 0    Recommended follow up:No follow-ups on file.

## 2019-08-14 NOTE — Progress Notes (Signed)
Phone [phone] Virtual visit via Video note   Subjective:  Chief complaint: Chief Complaint  Patient presents with  . Sinusitis    This visit type was conducted due to national recommendations for restrictions regarding the COVID-19 Pandemic (e.g. social distancing).  This format is felt to be most appropriate for this patient at this time balancing risks to patient and risks to population by having him in for in person visit.  No physical exam was performed (except for noted visual exam or audio findings with Telehealth visits).    Our team/I connected with [place] at  1:00 PM EST by a video enabled telemedicine application (doxy.me or caregility through epic) and verified that I am speaking with the correct person using two identifiers.  Location patient: [place] Location provider: [facility], office Persons participating in the virtual visit:  patient  Our team/I discussed the limitations of evaluation and management by telemedicine and the availability of in person appointments. In light of current covid-19 pandemic, patient also understands that we are trying to protect them by minimizing in office contact if at all possible.  The patient expressed consent for telemedicine visit and agreed to proceed. Patient understands insurance will be billed.   ROS- Review of Systems  Constitutional: Negative.   HENT: Negative.   Eyes: Negative.   Respiratory: Positive for cough.        From drainage   Cardiovascular: Negative.   Gastrointestinal: Negative.   Genitourinary: Negative.   Musculoskeletal: Negative.   Skin: Negative.   Neurological: Positive for headaches.       Intermittent with sinus issues.    Endo/Heme/Allergies: Negative.   Psychiatric/Behavioral: Negative.    Past Medical History-  Patient Active Problem List   Diagnosis Date Noted  . Venous insufficiency 06/11/2019    Priority: Medium  . History of squamous cell carcinoma 08/19/2014    Priority:  Medium  . Seborrheic dermatitis 08/28/2017    Priority: Low  . Actinic keratosis 06/08/2015    Priority: Low  . Acromioclavicular sprain 04/29/2015    Priority: Low  . History of cervical cancer 08/19/2014    Priority: Low  . Vitamin D deficiency 08/19/2014    Priority: Low  . Former smoker 08/19/2014    Priority: Low  . Right knee pain 03/07/2019  . Encounter for counseling 11/22/2018  . Gluteal tendonitis of left buttock 11/05/2018  . Somatic dysfunction of left sacroiliac joint 10/24/2018  . Tendonitis 05/31/2018  . Olecranon bursitis of right elbow 05/31/2018  . Weakness of right hip 03/28/2018  . Nonallopathic lesion of thoracic region 03/28/2018  . Nonallopathic lesion of lumbosacral region 03/28/2018  . Nonallopathic lesion of sacral region 03/28/2018    Medications- reviewed and updated Current Outpatient Medications  Medication Sig Dispense Refill  . bimatoprost (LATISSE) 0.03 % ophthalmic solution Place into both eyes at bedtime. Place one drop on applicator and apply evenly along the skin of the upper eyelid at base of eyelashes once daily at bedtime; repeat procedure for second eye (use a clean applicator).    . fluticasone (FLONASE) 50 MCG/ACT nasal spray Place 2 sprays into both nostrils daily. 16 g 3  . triamterene-hydrochlorothiazide (MAXZIDE-25) 37.5-25 MG tablet Take 0.5-1 tablets by mouth daily as needed (edema). 30 tablet 1  . Vitamin D, Ergocalciferol, (DRISDOL) 1.25 MG (50000 UT) CAPS capsule Take 1 capsule (50,000 Units total) by mouth every 7 (seven) days. 12 capsule 0  . amoxicillin-clavulanate (AUGMENTIN) 875-125 MG tablet Take 1 tablet by mouth  2 (two) times daily for 7 days. 14 tablet 0  . clobetasol ointment (TEMOVATE) 0.05 % clobetasol 0.05 % topical ointment    . fluconazole (DIFLUCAN) 150 MG tablet Take 1 tablet (150 mg total) by mouth daily. 1 tablet 0   No current facility-administered medications for this visit.     Objective:  Temp 97.6 F  (36.4 C) (Temporal)   Ht 5\' 5"  (1.651 m)   Wt 143 lb (64.9 kg)   BMI 23.80 kg/m  self reported vitals Gen: NAD, resting comfortably Points to frontal sinuses as source of pressure Lungs: nonlabored, normal respiratory rate  Skin: appears dry, no obvious rash     Assessment and Plan   # sinus congestion/COVID-19 S:Symptomes started on 08/04/19- started with mild runny nose. Got tested for covid 19 and positive on 08/04/2019. She felt runned down, mild chest pressure, fatigue.  Did have some cough but that seems to have improved-only seems to get now when sinuses drain.  She is already been cleared by health at work to return to work  Her other symptoms have improved but despite improving from most symptoms from covid- congestion has not improved- Has been on mucinex and afrin since that time- if doesn't take it chokes on own mucus- that does help.  Patient has had increased congestion- mainly clear- green in AMs .  She have some intermittent frontal headaches. She denies any sore throat or fever. Sinus symptoms are not improving- was miserable yesterday when tried to delay mucinex.  A/P: I suspect patient has a secondary bacterial sinusitis with ongoing sinus congestion and pressure despite improvement in other COVID-19 symptoms-recommended course of Augmentin.  She is going to take this with food-does have a history of some stomach upset on this and we can switch to doxycycline 100 mg twice a day if needed.  She also has a history of yeast infections after antibiotics-went ahead and sent in [place] for her.  She should reach out if she fails to improve or symptoms worsen.  -Vaccination-she had planned vaccine in-mid January 2021-recommended she push this out at least a month since she likely has lingering antibody defense at present  Lab/Order associations:   ICD-10-CM   1. COVID-19  U07.1   2. Bacterial sinusitis  J32.9    B96.89     Meds ordered this encounter  Medications  .  amoxicillin-clavulanate (AUGMENTIN) 875-125 MG tablet    Sig: Take 1 tablet by mouth 2 (two) times daily for 7 days.    Dispense:  14 tablet    Refill:  0  . fluconazole (DIFLUCAN) 150 MG tablet    Sig: Take 1 tablet (150 mg total) by mouth daily.    Dispense:  1 tablet    Refill:  0   Return precautions advised.  Garret Reddish, MD

## 2019-08-27 ENCOUNTER — Ambulatory Visit (INDEPENDENT_AMBULATORY_CARE_PROVIDER_SITE_OTHER): Payer: Self-pay | Admitting: Plastic Surgery

## 2019-08-27 ENCOUNTER — Other Ambulatory Visit: Payer: Self-pay | Admitting: Obstetrics and Gynecology

## 2019-08-27 ENCOUNTER — Encounter: Payer: Self-pay | Admitting: Plastic Surgery

## 2019-08-27 ENCOUNTER — Other Ambulatory Visit: Payer: Self-pay

## 2019-08-27 VITALS — BP 126/86 | HR 96 | Temp 98.0°F | Ht 65.5 in | Wt 143.6 lb

## 2019-08-27 DIAGNOSIS — Z411 Encounter for cosmetic surgery: Secondary | ICD-10-CM

## 2019-08-27 DIAGNOSIS — N6002 Solitary cyst of left breast: Secondary | ICD-10-CM

## 2019-08-27 DIAGNOSIS — N644 Mastodynia: Secondary | ICD-10-CM

## 2019-08-27 NOTE — Progress Notes (Signed)
Patient is here to discuss Botox.  She has dynamic lines in her forehead glabella and crows feet that she would like addressed.  She has had Botox before and her last injection was 2 months ago.  At that point she had 20 units and felt like she might need a little bit more.  30 units of Botox were injected between her glabella forehead and crows feet.  The crows feet got 4 units per side.  We will plan to have her make an appointment for 2 weeks in case she needs any touchups but otherwise she can follow-up in 2 to 3 months.

## 2019-09-09 ENCOUNTER — Ambulatory Visit
Admission: RE | Admit: 2019-09-09 | Discharge: 2019-09-09 | Disposition: A | Discharge status: Home / Self Care | Payer: No Typology Code available for payment source | Source: Ambulatory Visit | Attending: Obstetrics and Gynecology | Admitting: Obstetrics and Gynecology

## 2019-09-09 ENCOUNTER — Other Ambulatory Visit: Payer: Self-pay | Admitting: Obstetrics and Gynecology

## 2019-09-09 ENCOUNTER — Other Ambulatory Visit: Payer: Self-pay

## 2019-09-09 DIAGNOSIS — N644 Mastodynia: Secondary | ICD-10-CM

## 2019-09-09 DIAGNOSIS — N6002 Solitary cyst of left breast: Secondary | ICD-10-CM

## 2019-10-01 ENCOUNTER — Other Ambulatory Visit: Payer: Self-pay | Admitting: Family Medicine

## 2019-10-03 MED ORDER — VITAMIN D (ERGOCALCIFEROL) 1.25 MG (50000 UNIT) PO CAPS: 50000.0000 [IU] | ORAL_CAPSULE | ORAL | 0 refills | Status: DC

## 2019-10-03 MED FILL — VIT D2 1.25 MG (50,000 UNIT: 1.25 MG | 84 days supply | Qty: 12 | Fill #0

## 2019-11-11 ENCOUNTER — Encounter: Payer: Self-pay | Admitting: Family Medicine

## 2019-11-11 MED FILL — TRIAMTERENE-HCTZ 37.5-25 MG: 37.5-25 | 30 days supply | Qty: 30 | Fill #1

## 2019-11-11 NOTE — Telephone Encounter (Signed)
Added to open message.  

## 2019-11-11 NOTE — Telephone Encounter (Signed)
Added from second message ok to call in? Did not see where you have given in past.   did price i generic Latisse at multiple pharmacies and can get the generic at [facility] for $37.58 if you are ok to send in a prescription for me. Thanks!

## 2019-11-12 MED ORDER — BIMATOPROST 0.03 % EX SOLN: CUTANEOUS | 12 refills | Status: DC

## 2019-11-13 ENCOUNTER — Other Ambulatory Visit: Payer: Self-pay

## 2019-11-13 ENCOUNTER — Ambulatory Visit (INDEPENDENT_AMBULATORY_CARE_PROVIDER_SITE_OTHER): Payer: Self-pay | Admitting: Plastic Surgery

## 2019-11-13 DIAGNOSIS — Z411 Encounter for cosmetic surgery: Secondary | ICD-10-CM

## 2019-11-13 NOTE — Progress Notes (Signed)
Patient presents for cosmetic visit.  She is interested in more neuromodulator treatment for facial wrinkles.  Last time she got 30 units of Botox in the forehead glabella and crows feet areas.  She liked the result and want something similar this time.  She also wants to discuss filler placement in her cheeks or nasolabial fold.  She wanted to try disport this time so 90 units of Dysport were drawn up to correlate with the 30 units of Botox used previously.  This was split between the forehead glabella and crows feet.  Approximately 18 units were used for the crows feet on each side.  36 units were used for the forehead split o'clock cross 6 injection sites.  The remainder was used for the procerus and corrugators in the glabellar area.  We elected to use Restylane define for the nasolabial folds.  0.8 cc of a 1 cc vial was split between the right and left nasolabial folds.  This was more prominent on her left side so slightly more was used on that side.  This gave her a nice immediate result and will save the other remaining 0.2 cc for touchups or other areas if necessary.  I have asked her to make a follow-up appointment in 2 weeks that she can cancel if she is happy with how everything looks.

## 2019-11-21 MED FILL — FLUTICASONE PROP 50 MCG SPR: 50 | 30 days supply | Qty: 16 | Fill #1

## 2019-11-26 ENCOUNTER — Ambulatory Visit (INDEPENDENT_AMBULATORY_CARE_PROVIDER_SITE_OTHER): Payer: Self-pay | Admitting: Plastic Surgery

## 2019-11-26 ENCOUNTER — Encounter: Payer: Self-pay | Admitting: Plastic Surgery

## 2019-11-26 ENCOUNTER — Other Ambulatory Visit: Payer: Self-pay

## 2019-11-26 VITALS — BP 126/82 | HR 75 | Temp 97.6°F | Wt 142.0 lb

## 2019-11-26 DIAGNOSIS — Z411 Encounter for cosmetic surgery: Secondary | ICD-10-CM

## 2019-11-26 NOTE — Progress Notes (Signed)
Patient is here 2 weeks out from Dysport to the crows feet glabella and forehead.  She felt like she got a good response in the crows feet but still has more movement than she would like in the glabella and forehead.  She would like some additional treatment in those areas.  On exam she does still have activation of her corrugators and frontalis more than I would expect as she just had 90 units of Dysport distributed amongst these areas.  She says in the past she has had a few episodes where the response to treatment with Botox and Dysport has been far less then both her and her injecting provider expected.  In any case we will plan to distribute 60 more units of Dysport between the glabella and forehead.  This was done today after prepping the face with alcohol pad.

## 2019-12-10 ENCOUNTER — Other Ambulatory Visit: Payer: No Typology Code available for payment source

## 2020-01-07 NOTE — Progress Notes (Signed)
Phone [phone] In person visit   Subjective:   Megan Mcmillan is a 44 y.o. year old very pleasant female patient who presents for/with See problem oriented charting Chief Complaint  Patient presents with  . Follow-up  . hot flashes  . Fatigue   This visit occurred during the SARS-CoV-2 public health emergency.  Safety protocols were in place, including screening questions prior to the visit, additional usage of staff PPE, and extensive cleaning of exam room while observing appropriate contact time as indicated for disinfecting solutions.   Past Medical History-  Patient Active Problem List   Diagnosis Date Noted  . Venous insufficiency 06/11/2019    Priority: Medium  . History of squamous cell carcinoma 08/19/2014    Priority: Medium  . Seborrheic dermatitis 08/28/2017    Priority: Low  . Actinic keratosis 06/08/2015    Priority: Low  . Acromioclavicular sprain 04/29/2015    Priority: Low  . History of cervical cancer 08/19/2014    Priority: Low  . Vitamin D deficiency 08/19/2014    Priority: Low  . Former smoker 08/19/2014    Priority: Low  . Right knee pain 03/07/2019  . Encounter for counseling 11/22/2018  . Gluteal tendonitis of left buttock 11/05/2018  . Somatic dysfunction of left sacroiliac joint 10/24/2018  . Tendonitis 05/31/2018  . Olecranon bursitis of right elbow 05/31/2018  . Weakness of right hip 03/28/2018  . Nonallopathic lesion of thoracic region 03/28/2018  . Nonallopathic lesion of lumbosacral region 03/28/2018  . Nonallopathic lesion of sacral region 03/28/2018    Medications- reviewed and updated Current Outpatient Medications  Medication Sig Dispense Refill  . bimatoprost (LATISSE) 0.03 % ophthalmic solution Place one drop on applicator and apply evenly along the skin of the upper eyelid at base of eyelashes once daily at bedtime; repeat procedure for second eye (use a clean applicator). Patient may choose 3 ml size per preference 5 mL 12    . clobetasol ointment (TEMOVATE) 0.05 % clobetasol 0.05 % topical ointment    . fluticasone (FLONASE) 50 MCG/ACT nasal spray Place 2 sprays into both nostrils daily. 16 g 3  . triamterene-hydrochlorothiazide (MAXZIDE-25) 37.5-25 MG tablet Take 0.5-1 tablets by mouth daily as needed (edema). 30 tablet 3  . Vitamin D, Ergocalciferol, (DRISDOL) 1.25 MG (50000 UNIT) CAPS capsule Take 1 capsule (50,000 Units total) by mouth every 7 (seven) days. 13 capsule 3   No current facility-administered medications for this visit.     Objective:  BP 112/80   Pulse 69   Temp 98.6 F (37 C)   Ht 5' 5.5" (1.664 m)   Wt 151 lb 6.4 oz (68.7 kg)   SpO2 99%   BMI 24.81 kg/m  Gen: NAD, resting comfortably No thyromegaly or lymphadenopathy cervical.  Tympanic membrane normal.  Oropharynx normal CV: RRR no murmurs rubs or gallops Lungs: CTAB no crackles, wheeze, rhonchi Abdomen: soft/nontender/nondistended/normal bowel sounds. No rebound or guarding.  Ext: Trace edema Skin: warm, dry     Assessment and Plan   #Fatigue/Weight Gain/hot flashes S: pt c/o weight gain, fatigue and hot flashes. Pt states she can sleep 12 hours a day and still be tired- this started months after covid. Her eating habits are still the same and she is still working out but gaining weight- feels like its in the midsection. Up 9 lbs in 6 weeks. She has cut back on alcohol-  2-3 vodka tonics during week and maybe 2-3 beers on weekend. No body aches.  Main fatigue issue  is excessive sleepiness  Initially thought issues could possibly related to covid 19 though despite being further out from December infection continues to have issues. Fatigue issues just started in last 6-8 weeks so long after covid 19 though.   Hot flashes have been ongoing- she prefers colder temperatures as she feels warm. Has IUD in place- 8 years Mirena. Does not get cycles on mirena.   Does snore. Declines sleep apnea testing for now.   Has noted having to  take maxzide more frequently for edema.   No chest pain or shortness of breath-able to exercise without issues.  Denies depressed mood or anhedonia A/P: 44 year old female with fatigue and weight gain.  Primary fatigue symptom is excessive sleepiness -May have sleep apnea but declines sleep apnea testing for now-also that would not explain weight gain -Feels like she is exercising and eating very similarly to previous but with 9 pound weight gain in less than 2 months-due to the degree of weight gain will get TSH, T3, T4 -May be perimenopausal.  Does not have periods with IUD in place.  I do not think hormonal testing is needed in initial testing -1 thought I had after patient left was possible echocardiogram with increased weight and edema recently-using triamterene hydrochlorothiazide more regularly.  But I strongly doubt something like heart failure in her young age  #Vitamin D deficiency- hip pain is better and feels better on high dose d-previously prescribed by Dr. Tamala Julian and would like me to take over this prescription S: Medication: high dose vitamin D 50k units Last vitamin D Lab Results  Component Value Date   VD25OH 43.0 11/30/2017  A/P: Patient has remained on higher dose vitamin D but no recent check vitamin D-we will check with labs.  I asked her not to refill medication which I sent in the day until we know vitamin D is at least not on higher end of normal or rapidly increasing  Recommended follow up: Return for as needed for new, worsening, persistent symptoms.   Lab/Order associations:   ICD-10-CM   1. Fatigue, unspecified type  R53.83 TSH    CBC with Differential/Platelet    Comprehensive metabolic panel    T4, free    T3, free  2. Vitamin D deficiency  E55.9 VITAMIN D 25 Hydroxy (Vit-D Deficiency, Fractures)    Meds ordered this encounter  Medications  . triamterene-hydrochlorothiazide (MAXZIDE-25) 37.5-25 MG tablet    Sig: Take 0.5-1 tablets by mouth daily as  needed (edema).    Dispense:  30 tablet    Refill:  3  . Vitamin D, Ergocalciferol, (DRISDOL) 1.25 MG (50000 UNIT) CAPS capsule    Sig: Take 1 capsule (50,000 Units total) by mouth every 7 (seven) days.    Dispense:  13 capsule    Refill:  3   Return precautions advised.  Garret Reddish, MD

## 2020-01-14 ENCOUNTER — Other Ambulatory Visit: Payer: Self-pay

## 2020-01-14 ENCOUNTER — Other Ambulatory Visit: Payer: Self-pay | Admitting: Family Medicine

## 2020-01-14 ENCOUNTER — Ambulatory Visit (INDEPENDENT_AMBULATORY_CARE_PROVIDER_SITE_OTHER): Payer: No Typology Code available for payment source | Admitting: Family Medicine

## 2020-01-14 ENCOUNTER — Encounter: Payer: Self-pay | Admitting: Family Medicine

## 2020-01-14 VITALS — BP 112/80 | HR 69 | Temp 98.6°F | Ht 65.5 in | Wt 151.4 lb

## 2020-01-14 DIAGNOSIS — E559 Vitamin D deficiency, unspecified: Secondary | ICD-10-CM

## 2020-01-14 DIAGNOSIS — R5383 Other fatigue: Secondary | ICD-10-CM

## 2020-01-14 LAB — T3, FREE: T3, Free: 3.6 pg/mL (ref 2.3–4.2)

## 2020-01-14 LAB — CBC WITH DIFFERENTIAL/PLATELET
Basophils Absolute: 0 10*3/uL (ref 0.0–0.1)
Basophils Relative: 0.3 % (ref 0.0–3.0)
Eosinophils Absolute: 0 10*3/uL (ref 0.0–0.7)
Eosinophils Relative: 0.5 % (ref 0.0–5.0)
HCT: 40.5 % (ref 36.0–46.0)
Hemoglobin: 13.6 g/dL (ref 12.0–15.0)
Lymphocytes Relative: 21.6 % (ref 12.0–46.0)
Lymphs Abs: 1.6 10*3/uL (ref 0.7–4.0)
MCHC: 33.5 g/dL (ref 30.0–36.0)
MCV: 100.6 fl — ABNORMAL HIGH (ref 78.0–100.0)
Monocytes Absolute: 0.5 10*3/uL (ref 0.1–1.0)
Monocytes Relative: 6.5 % (ref 3.0–12.0)
Neutro Abs: 5.4 10*3/uL (ref 1.4–7.7)
Neutrophils Relative %: 71.1 % (ref 43.0–77.0)
Platelets: 228 10*3/uL (ref 150.0–400.0)
RBC: 4.02 Mil/uL (ref 3.87–5.11)
RDW: 13.5 % (ref 11.5–15.5)
WBC: 7.6 10*3/uL (ref 4.0–10.5)

## 2020-01-14 LAB — COMPREHENSIVE METABOLIC PANEL
ALT: 39 U/L — ABNORMAL HIGH (ref 0–35)
AST: 26 U/L (ref 0–37)
Albumin: 4.3 g/dL (ref 3.5–5.2)
Alkaline Phosphatase: 48 U/L (ref 39–117)
BUN: 9 mg/dL (ref 6–23)
CO2: 29 mEq/L (ref 19–32)
Calcium: 9.2 mg/dL (ref 8.4–10.5)
Chloride: 100 mEq/L (ref 96–112)
Creatinine, Ser: 0.7 mg/dL (ref 0.40–1.20)
GFR: 90.33 mL/min (ref >60.00)
Glucose, Bld: 76 mg/dL (ref 70–99)
Potassium: 4.1 mEq/L (ref 3.5–5.1)
Sodium: 138 mEq/L (ref 135–145)
Total Bilirubin: 0.6 mg/dL (ref 0.2–1.2)
Total Protein: 6.9 g/dL (ref 6.0–8.3)

## 2020-01-14 LAB — VITAMIN D 25 HYDROXY (VIT D DEFICIENCY, FRACTURES): VITD: 62.86 ng/mL (ref 30.00–100.00)

## 2020-01-14 LAB — T4, FREE: Free T4: 0.67 ng/dL (ref 0.60–1.60)

## 2020-01-14 LAB — TSH: TSH: 1.69 u[IU]/mL (ref 0.35–4.50)

## 2020-01-14 MED ORDER — TRIAMTERENE-HCTZ 37.5-25 MG PO TABS: 0.5000 | ORAL_TABLET | Freq: Every day | ORAL | 3 refills | Status: DC | PRN

## 2020-01-14 MED ORDER — VITAMIN D (ERGOCALCIFEROL) 1.25 MG (50000 UNIT) PO CAPS: 50000.0000 [IU] | ORAL_CAPSULE | ORAL | 3 refills | Status: DC

## 2020-01-14 NOTE — Patient Instructions (Addendum)
Please stop by lab before you go If you have mychart- we will send your results within 3 business days of [place] receiving them.  If you do not have mychart- we will call you about results within 5 business days of [place] receiving them.   I did send in vitamin D

## 2020-01-15 ENCOUNTER — Other Ambulatory Visit: Payer: Self-pay

## 2020-01-15 DIAGNOSIS — D7589 Other specified diseases of blood and blood-forming organs: Secondary | ICD-10-CM

## 2020-01-16 ENCOUNTER — Other Ambulatory Visit (INDEPENDENT_AMBULATORY_CARE_PROVIDER_SITE_OTHER): Payer: No Typology Code available for payment source

## 2020-01-16 ENCOUNTER — Other Ambulatory Visit: Payer: Self-pay

## 2020-01-16 DIAGNOSIS — D7589 Other specified diseases of blood and blood-forming organs: Secondary | ICD-10-CM

## 2020-01-16 LAB — VITAMIN B12: Vitamin B-12: 303 pg/mL (ref 211–911)

## 2020-01-20 ENCOUNTER — Encounter: Payer: Self-pay | Admitting: Family Medicine

## 2020-01-20 LAB — METHYLMALONIC ACID, SERUM: Methylmalonic Acid, Quant: 308 nmol/L (ref 87–318)

## 2020-01-20 LAB — FOLATE RBC: RBC Folate: 595 ng/mL RBC (ref >280)

## 2020-01-28 ENCOUNTER — Encounter: Payer: No Typology Code available for payment source | Admitting: Plastic Surgery

## 2020-02-05 ENCOUNTER — Other Ambulatory Visit: Payer: Self-pay

## 2020-02-05 ENCOUNTER — Ambulatory Visit (INDEPENDENT_AMBULATORY_CARE_PROVIDER_SITE_OTHER): Payer: Self-pay | Admitting: Plastic Surgery

## 2020-02-05 ENCOUNTER — Encounter: Payer: Self-pay | Admitting: Plastic Surgery

## 2020-02-05 VITALS — BP 130/78 | HR 80 | Temp 97.8°F

## 2020-02-05 DIAGNOSIS — Z411 Encounter for cosmetic surgery: Secondary | ICD-10-CM

## 2020-02-05 NOTE — Progress Notes (Signed)
Patient presents for treatment of dynamic righted's in the forehead glabella and crows feet.  We previously done Botox and Dysport.  The last time she got 90 units of Dysport distributed throughout but came back 2 weeks later for an additional 60 units as the initial effect seem to wear off.  She like to go with Botox at this time.  40 units of Botox were drawn up which is 10 units more tha I used in January.  After prepping the face with chlorhexidine wipe and was distributed between the crows feet, forehead and glabella.  12 units were used for the crows feet, 12 units were used for the glabella and remainder injected in the forehead as she wanted a more significant correction in that area.  She tolerated this well.

## 2020-04-14 MED FILL — VIT D2 1.25 MG (50,000 UNIT: 1.25 MG | 90 days supply | Qty: 13 | Fill #1

## 2020-04-22 ENCOUNTER — Other Ambulatory Visit: Payer: Self-pay

## 2020-04-22 ENCOUNTER — Ambulatory Visit (INDEPENDENT_AMBULATORY_CARE_PROVIDER_SITE_OTHER): Payer: Self-pay | Admitting: Plastic Surgery

## 2020-04-22 ENCOUNTER — Encounter: Payer: Self-pay | Admitting: Plastic Surgery

## 2020-04-22 VITALS — BP 115/71 | HR 85

## 2020-04-22 DIAGNOSIS — Z411 Encounter for cosmetic surgery: Secondary | ICD-10-CM

## 2020-04-22 NOTE — Progress Notes (Signed)
Patient presents for Botox injection.  Her last visit we did 40 units in the forehead glabella and crows feet.  She was happy with that without and would like a repeat injection.  We discussed the risks and benefits which she is well aware of and she elects to proceed.  40 units of Botox were distributed from the forehead, glabella and crows feet.  She tolerated this fine.  We'll plan to see her again at her next visit.

## 2020-05-05 ENCOUNTER — Encounter: Payer: Self-pay | Admitting: Family Medicine

## 2020-05-05 ENCOUNTER — Other Ambulatory Visit: Payer: Self-pay

## 2020-05-05 ENCOUNTER — Ambulatory Visit (INDEPENDENT_AMBULATORY_CARE_PROVIDER_SITE_OTHER): Payer: No Typology Code available for payment source | Admitting: Family Medicine

## 2020-05-05 VITALS — BP 118/80 | HR 73 | Temp 97.9°F | Resp 18 | Ht 66.0 in | Wt 145.6 lb

## 2020-05-05 DIAGNOSIS — R4189 Other symptoms and signs involving cognitive functions and awareness: Secondary | ICD-10-CM

## 2020-05-05 DIAGNOSIS — F488 Other specified nonpsychotic mental disorders: Secondary | ICD-10-CM | POA: Diagnosis not present

## 2020-05-05 NOTE — Progress Notes (Signed)
Phone [phone] In person visit   Subjective:   Megan Mcmillan is a 44 y.o. year old very pleasant female patient who presents for/with See problem oriented charting Chief Complaint  Patient presents with  . Covid Brain    This visit occurred during the SARS-CoV-2 public health emergency.  Safety protocols were in place, including screening questions prior to the visit, additional usage of staff PPE, and extensive cleaning of exam room while observing appropriate contact time as indicated for disinfecting solutions.   Past Medical History-  Patient Active Problem List   Diagnosis Date Noted  . Venous insufficiency 06/11/2019    Priority: Medium  . History of squamous cell carcinoma 08/19/2014    Priority: Medium  . Seborrheic dermatitis 08/28/2017    Priority: Low  . Actinic keratosis 06/08/2015    Priority: Low  . Acromioclavicular sprain 04/29/2015    Priority: Low  . History of cervical cancer 08/19/2014    Priority: Low  . Vitamin D deficiency 08/19/2014    Priority: Low  . Former smoker 08/19/2014    Priority: Low  . Right knee pain 03/07/2019  . Encounter for counseling 11/22/2018  . Gluteal tendonitis of left buttock 11/05/2018  . Somatic dysfunction of left sacroiliac joint 10/24/2018  . Tendonitis 05/31/2018  . Olecranon bursitis of right elbow 05/31/2018  . Weakness of right hip 03/28/2018  . Nonallopathic lesion of thoracic region 03/28/2018  . Nonallopathic lesion of lumbosacral region 03/28/2018  . Nonallopathic lesion of sacral region 03/28/2018    Medications- reviewed and updated Current Outpatient Medications  Medication Sig Dispense Refill  . bimatoprost (LATISSE) 0.03 % ophthalmic solution Place one drop on applicator and apply evenly along the skin of the upper eyelid at base of eyelashes once daily at bedtime; repeat procedure for second eye (use a clean applicator). Patient may choose 3 ml size per preference 5 mL 12  . clobetasol ointment  (TEMOVATE) 0.05 % clobetasol 0.05 % topical ointment    . fluticasone (FLONASE) 50 MCG/ACT nasal spray Place 2 sprays into both nostrils daily. 16 g 3  . levonorgestrel (MIRENA, 52 MG,) 20 MCG/24HR IUD Mirena 20 mcg/24 hours (7 yrs) 52 mg intrauterine device    . triamterene-hydrochlorothiazide (MAXZIDE-25) 37.5-25 MG tablet Take 0.5-1 tablets by mouth daily as needed (edema). 30 tablet 3  . Vitamin D, Ergocalciferol, (DRISDOL) 1.25 MG (50000 UNIT) CAPS capsule Take 1 capsule (50,000 Units total) by mouth every 7 (seven) days. 13 capsule 3   No current facility-administered medications for this visit.     Objective:  BP 118/80   Pulse 73   Temp 97.9 F (36.6 C) (Temporal)   Resp 18   Ht 5\' 6"  (1.676 m)   Wt 145 lb 9.6 oz (66 kg)   SpO2 98%   BMI 23.50 kg/m  Gen: NAD, resting comfortably CV: RRR no murmurs rubs or gallops Lungs: CTAB no crackles, wheeze, rhonchi Ext: no edema Skin: warm, dry     Assessment and Plan  #Short-term memory loss since COVID-19 infection in December-patient concern for COVID Brain S:Patient mentioned that here recently she has been having some short term memory. She stated that she has to keep post it notes around to help her remember things. Symptoms started in December.   May pick up phone to look at weather and forget why she picked up the phone. No issues with long term memory but more shor tterm. Feels like having issues with concentrating. Has not gotten worse since covid.  Easily distracted- goes down "rabbit holes"  Has had to focus to see the screen which is new. Saw eye doctor and had new glasses and that has helped.   Fatigue is slightly improved with b12 but tapers off. If drinks coffee past 10 pm has trouble sleeping.  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  Neurological exam reassuring today.I strongly suspect patient's symptoms are related to prior COVID-19 infection.  I have  seen similar symptoms in several patients.  We discussed data on up-to-date suggesting brain fog is a common symptom as part of long-haul syndrome.  Patient also has lingering fatigue.  She is already been vaccinated after COVID-19 which is one of the few suggested treatments per up-to-date.  We discussed there are some options with less strength of evidence that we could consider like clovoxamine but after discussion she would prefer to continue with current regimen-we did opt to add vitamin C.  She will continue vitamin D and B12.  Her thyroid was normal.  Since vision improved with changing glasses I do not feel strongly about neuroimaging.    Recommended follow up: Keep January visit Future Appointments  Date Time Provider [place]  05/17/2020  3:30 PM [place], RN [facility] None  06/24/2020  3:45 PM [place], Steffanie Dunn, MD [facility] None  08/26/2020  9:40 AM Yong Channel Brayton Mars, MD [facility]    Lab/Order associations:   ICD-10-CM   1. Brain fog  F48.8    Time Spent: 26 minutes of total time (9:40  AM- 10:06 AM) was spent on the date of the encounter performing the following actions: chart review prior to seeing the patient, obtaining history, performing a medically necessary exam, counseling on the treatment plan, placing orders, and documenting in our EHR.   Return precautions advised.  Garret Reddish, MD

## 2020-05-05 NOTE — Patient Instructions (Addendum)
Health Maintenance Due  Topic Date Due  . INFLUENZA VACCINE Declined in office flu shot. She would rather get her covid booster first and then get her flu shot. She will let us know when she gets these.  03/07/2020   Would be reasonable to try vitamin C  I do think this is related to prior covid infection  I would continue vitamin D and b12  Keep me updated- I wish we had more to offer but at this point no strong data. Will keep an eye out if NIH changes info on fluvoxamine (but this is still looking like more acute treatment than long haul syndrome therapy)

## 2020-05-10 ENCOUNTER — Other Ambulatory Visit (HOSPITAL_BASED_OUTPATIENT_CLINIC_OR_DEPARTMENT_OTHER): Payer: Self-pay | Admitting: Dermatology

## 2020-05-10 MED FILL — IMIQUIMOD 5 % CREA: 5 | 28 days supply | Qty: 20 | Fill #0

## 2020-05-17 ENCOUNTER — Other Ambulatory Visit: Payer: Self-pay

## 2020-05-17 ENCOUNTER — Ambulatory Visit: Payer: Self-pay

## 2020-05-17 VITALS — BP 124/78 | HR 79 | Temp 98.1°F | Wt 142.0 lb

## 2020-05-17 DIAGNOSIS — Z719 Counseling, unspecified: Secondary | ICD-10-CM

## 2020-05-21 ENCOUNTER — Ambulatory Visit: Payer: No Typology Code available for payment source | Admitting: Family Medicine

## 2020-05-21 ENCOUNTER — Other Ambulatory Visit (HOSPITAL_BASED_OUTPATIENT_CLINIC_OR_DEPARTMENT_OTHER): Payer: Self-pay | Admitting: Internal Medicine

## 2020-05-21 ENCOUNTER — Ambulatory Visit: Payer: No Typology Code available for payment source | Attending: Internal Medicine

## 2020-05-21 DIAGNOSIS — Z23 Encounter for immunization: Secondary | ICD-10-CM

## 2020-05-21 NOTE — Progress Notes (Signed)
   Covid-19 Vaccination Clinic  Name:  Megan Mcmillan    MRN: [id number] DOB: 1975/10/03  05/21/2020  [name] was observed post Covid-19 immunization for 15 minutes without incident. She was provided with Vaccine Information Sheet and instruction to access the V-Safe system. Vaccinated by Levi Aland.  [name] was instructed to call 911 with any severe reactions post vaccine: Marland Kitchen Difficulty breathing  . Swelling of face and throat  . A fast heartbeat  . A bad rash all over body  . Dizziness and weakness

## 2020-05-24 ENCOUNTER — Ambulatory Visit: Payer: No Typology Code available for payment source

## 2020-05-31 MED FILL — PFIZER-BIONTECH COVID-19 VA: 30 | 1 days supply | Qty: 0 | Fill #0

## 2020-06-23 ENCOUNTER — Ambulatory Visit (INDEPENDENT_AMBULATORY_CARE_PROVIDER_SITE_OTHER): Payer: No Typology Code available for payment source | Admitting: Family

## 2020-06-23 ENCOUNTER — Other Ambulatory Visit: Payer: Self-pay

## 2020-06-23 VITALS — BP 109/68 | HR 70 | Temp 98.0°F | Resp 16 | Ht 66.0 in | Wt 145.0 lb

## 2020-06-23 DIAGNOSIS — Z8541 Personal history of malignant neoplasm of cervix uteri: Secondary | ICD-10-CM

## 2020-06-23 DIAGNOSIS — Z8589 Personal history of malignant neoplasm of other organs and systems: Secondary | ICD-10-CM | POA: Diagnosis not present

## 2020-06-23 DIAGNOSIS — E559 Vitamin D deficiency, unspecified: Secondary | ICD-10-CM

## 2020-06-23 DIAGNOSIS — H6982 Other specified disorders of Eustachian tube, left ear: Secondary | ICD-10-CM | POA: Diagnosis not present

## 2020-06-23 NOTE — Patient Instructions (Signed)
Please continue claritin and sudafed. Add flonase 2 sprays each nostril once daily.   Eustachian Tube Dysfunction  Eustachian tube dysfunction refers to a condition in which a blockage develops in the narrow passage that connects the middle ear to the back of the nose (eustachian tube). The eustachian tube regulates air pressure in the middle ear by letting air move between the ear and nose. It also helps to drain fluid from the middle ear space. Eustachian tube dysfunction can affect one or both ears. When the eustachian tube does not function properly, air pressure, fluid, or both can build up in the middle ear. What are the causes? This condition occurs when the eustachian tube becomes blocked or cannot open normally. Common causes of this condition include:  Ear infections.  Colds and other infections that affect the nose, mouth, and throat (upper respiratory tract).  Allergies.  Irritation from cigarette smoke.  Irritation from stomach acid coming up into the esophagus (gastroesophageal reflux). The esophagus is the tube that carries food from the mouth to the stomach.  Sudden changes in air pressure, such as from descending in an airplane or scuba diving.  Abnormal growths in the nose or throat, such as: ? Growths that line the nose (nasal polyps). ? Abnormal growth of cells (tumors). ? Enlarged tissue at the back of the throat (adenoids). What increases the risk? You are more likely to develop this condition if:  You smoke.  You are overweight.  You are a child who has: ? Certain birth defects of the mouth, such as cleft palate. ? Large tonsils or adenoids. What are the signs or symptoms? Common symptoms of this condition include:  A feeling of fullness in the ear.  Ear pain.  Clicking or popping noises in the ear.  Ringing in the ear.  Hearing loss.  Loss of balance.  Dizziness. Symptoms may get worse when the air pressure around you changes, such as when  you travel to an area of high elevation, fly on an airplane, or go scuba diving. How is this diagnosed? This condition may be diagnosed based on:  Your symptoms.  A physical exam of your ears, nose, and throat.  Tests, such as those that measure: ? The movement of your eardrum (tympanogram). ? Your hearing (audiometry). How is this treated? Treatment depends on the cause and severity of your condition.  In mild cases, you may relieve your symptoms by moving air into your ears. This is called "popping the ears."  In more severe cases, or if you have symptoms of fluid in your ears, treatment may include: ? Medicines to relieve congestion (decongestants). ? Medicines that treat allergies (antihistamines). ? Nasal sprays or ear drops that contain medicines that reduce swelling (steroids). ? A procedure to drain the fluid in your eardrum (myringotomy). In this procedure, a small tube is placed in the eardrum to:  Drain the fluid.  Restore the air in the middle ear space. ? A procedure to insert a balloon device through the nose to inflate the opening of the eustachian tube (balloon dilation). Follow these instructions at home: Lifestyle  Do not do any of the following until your health care provider approves: ? Travel to high altitudes. ? Fly in airplanes. ? Work in a Pension scheme manager or room. ? Scuba dive.  Do not use any products that contain nicotine or tobacco, such as cigarettes and e-cigarettes. If you need help quitting, ask your health care provider.  Keep your ears dry. Wear fitted  earplugs during showering and bathing. Dry your ears completely after. General instructions  Take over-the-counter and prescription medicines only as told by your health care provider.  Use techniques to help pop your ears as recommended by your health care provider. These may include: ? Chewing gum. ? Yawning. ? Frequent, forceful swallowing. ? Closing your mouth, holding your nose  closed, and gently blowing as if you are trying to blow air out of your nose.  Keep all follow-up visits as told by your health care provider. This is important. Contact a health care provider if:  Your symptoms do not go away after treatment.  Your symptoms come back after treatment.  You are unable to pop your ears.  You have: ? A fever. ? Pain in your ear. ? Pain in your head or neck. ? Fluid draining from your ear.  Your hearing suddenly changes.  You become very dizzy.  You lose your balance. Summary  Eustachian tube dysfunction refers to a condition in which a blockage develops in the eustachian tube.  It can be caused by ear infections, allergies, inhaled irritants, or abnormal growths in the nose or throat.  Symptoms include ear pain, hearing loss, or ringing in the ears.  Mild cases are treated with maneuvers to unblock the ears, such as yawning or ear popping.  Severe cases are treated with medicines. Surgery may also be done (rare). This information is not intended to replace advice given to you by your health care provider. Make sure you discuss any questions you have with your health care provider. Document Revised: 11/13/2017 Document Reviewed: 11/13/2017 Elsevier Patient Education  [place].

## 2020-06-23 NOTE — Progress Notes (Signed)
Subjective:    Patient ID: Megan Mcmillan, female    DOB: 31-Aug-1975, 44 y.o.   MRN: [id number]  HPI  Patient is a 44 yr old female with hx of chronic seasonal allergies,  who presents today with chief complaint of left sided ear pain.  Pain began 3 days ago. Reports that she took some benadryl on Monday night with slight improvement the following day.  Denies associated fever or drainage.  She has been taking sudafed and claritin recently.   Review of Systems See HPI  Past Medical History:  Diagnosis Date  . Migraine 08/19/2014   No recurrence after birth of son. Previously on flexeril.       Social History   Socioeconomic History  . Marital status: Married    Spouse name: Not on file  . Number of children: Not on file  . Years of education: Not on file  . Highest education level: Not on file  Occupational History  . Not on file  Tobacco Use  . Smoking status: Former Smoker    Packs/day: 1.00    Years: 21.00    Pack years: 21.00    Types: Cigarettes    Quit date: 11/06/2010    Years since quitting: 9.6  . Smokeless tobacco: Never Used  Vaping Use  . Vaping Use: Never used  Substance and Sexual Activity  . Alcohol use: Yes    Alcohol/week: 5.0 standard drinks    Types: 5 Standard drinks or equivalent per week  . Drug use: No  . Sexual activity: Not on file  Other Topics Concern  . Not on file  Social History Narrative   Married 2015. Son 7[address] [name]- also patient of Dr. Yong Channel.     Finished HS.       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 Difficulty of Paying Living Expenses: Not on file  Food Insecurity:   . Worried About Charity fundraiser in the Last Year: Not on file  . Ran Out of Food in the Last Year: Not on  file  Transportation Needs:   . Lack of Transportation (Medical): Not on file  . Lack of Transportation (Non-Medical): Not on file  Physical Activity:   . Days of Exercise per Week: Not on file  . Minutes of Exercise per Session: Not on file  Stress:   . Feeling of Stress : Not on file  Social Connections:   . Frequency of Communication with Friends and Family: Not on file  . Frequency of Social Gatherings with Friends and Family: Not on file  . Attends Religious Services: Not on file  . Active Member of Clubs or Organizations: Not on file  . Attends Archivist Meetings: Not on file  . Marital Status: Not on file  Intimate Partner Violence:   . Fear of Current or Ex-Partner: Not on file  . Emotionally Abused: Not on file  . Physically Abused: Not on file  . Sexually Abused: Not on file    Past Surgical History:  Procedure Laterality Date  . CERVICAL CONIZATION W/BX  1997   cold knife, no abnormal paps since.   Marland Kitchen VAGINAL DELIVERY  1998    Family History  Problem Relation  Age of Onset  . Hypertension Mother   . Depression Mother        "depression in entire family"  . Heart attack Father 41       smoked marijuana for many years  . Hypertension Father   . Depression Father   . Breast cancer Maternal Grandmother        66s  . Diabetes Maternal Grandmother   . Dementia Maternal Grandmother     Allergies  Allergen Reactions  . Latex     swelling    Current Outpatient Medications on File Prior to Visit  Medication Sig Dispense Refill  . bimatoprost (LATISSE) 0.03 % ophthalmic solution Place one drop on applicator and apply evenly along the skin of the upper eyelid at base of eyelashes once daily at bedtime; repeat procedure for second eye (use a clean applicator). Patient may choose 3 ml size per preference 5 mL 12  . clobetasol ointment (TEMOVATE) 0.05 % clobetasol 0.05 % topical ointment    . fluticasone (FLONASE) 50 MCG/ACT nasal spray Place 2 sprays into  both nostrils daily. 16 g 3  . levonorgestrel (MIRENA, 52 MG,) 20 MCG/24HR IUD Mirena 20 mcg/24 hours (7 yrs) 52 mg intrauterine device    . triamterene-hydrochlorothiazide (MAXZIDE-25) 37.5-25 MG tablet Take 0.5-1 tablets by mouth daily as needed (edema). 30 tablet 3  . Vitamin D, Ergocalciferol, (DRISDOL) 1.25 MG (50000 UNIT) CAPS capsule Take 1 capsule (50,000 Units total) by mouth every 7 (seven) days. 13 capsule 3   No current facility-administered medications on file prior to visit.    BP 109/68 (BP Location: Right Arm, Patient Position: Sitting, Cuff Size: Small)   Pulse 70   Temp 98 F (36.7 C) (Oral)   Resp 16   Ht 5\' 6"  (1.676 m)   Wt 145 lb (65.8 kg)   SpO2 100%   BMI 23.40 kg/m       Objective:   Physical Exam Constitutional:      Appearance: Normal appearance.  HENT:     Head: Normocephalic and atraumatic.     Right Ear: Tympanic membrane and ear canal normal.     Left Ear: Tympanic membrane and ear canal normal.  Pulmonary:     Effort: Pulmonary effort is normal.  Skin:    General: Skin is warm and dry.  Neurological:     Mental Status: She is alert and oriented to person, place, and time.  Psychiatric:        Mood and Affect: Mood normal.        Behavior: Behavior normal.        Thought Content: Thought content normal.        Judgment: Judgment normal.           Assessment & Plan:  Eustachian tube dysfunction- symptoms most consistent with eustachian tube dysfunction. Recommended that she continue claritin, sudafed and add flonase 2 sprays each nostril once daily.  She will let us know if symptoms worsen or if symptoms fail to improve.  This visit occurred during the SARS-CoV-2 public health emergency.  Safety protocols were in place, including screening questions prior to the visit, additional usage of staff PPE, and extensive cleaning of exam room while observing appropriate contact time as indicated for disinfecting solutions.

## 2020-06-24 ENCOUNTER — Encounter: Payer: Self-pay | Admitting: Plastic Surgery

## 2020-06-24 ENCOUNTER — Encounter: Payer: No Typology Code available for payment source | Admitting: Plastic Surgery

## 2020-06-24 ENCOUNTER — Other Ambulatory Visit: Payer: Self-pay

## 2020-06-24 ENCOUNTER — Ambulatory Visit (INDEPENDENT_AMBULATORY_CARE_PROVIDER_SITE_OTHER): Payer: Self-pay | Admitting: Plastic Surgery

## 2020-06-24 VITALS — BP 124/76 | HR 65

## 2020-06-24 DIAGNOSIS — Z411 Encounter for cosmetic surgery: Secondary | ICD-10-CM

## 2020-06-24 NOTE — Progress Notes (Signed)
Patient presents for Botox and filler injection.  She had 40 units of Botox previously distributed between the forehead, glabella and crows feet and liked that result.  She would also like to discuss filler injection into the malar area.  We reviewed the risks and benefits of filler and Botox injection and she agrees to proceed.  The forehead, glabella and crows feet were prepped with an alcohol pad and 40 units of Botox were distributed throughout.  She tolerated this fine.  The cheeks were prepped with an alcohol pad and 1 cc of Restylane contour was split between the malar areas on both sides.  The needle was kept moving at all times to avoid intravascular injection.  She tolerated this fine and was happy with the on table result.  I am happy to see her again in 2 weeks for touchup or at her next appointment.

## 2020-07-05 NOTE — Patient Instructions (Signed)
Pt is aware that she may have dry skin & slight peeling- she will use moisturizer and sunscreen as needed. She will call for any concerns. F/u in 4 weeks

## 2020-07-05 NOTE — Progress Notes (Unsigned)
Preoperative Dx: facial aging  Postoperative Dx:  same  Procedure: laser to mid back hyperpigmented  areas & bilateral lower legs/hyperpigmented areas Anesthesia: EMLA -pt applied 30 mins prior to procedure  Description of Procedure:  Risks and complications were explained to the patient. Consent was confirmed and signed.  Time out was called and all information was confirmed to be correct. The area  was prepped with alcohol and wiped dry.  The IPL  laser was set at the following:  skin -1/ suntan light -  7.0J  The patient tolerated the procedure well and there were no complications.  Aloe applied  She is reminded to protect skin with sunscreen & moisturizer & avoid   abrasive scrubs for approx. 7 to 10 days  She is reminded that she may experience redness/peeling & irritation Patient to f/u in 4 weeks-  She will call for any concerns [facility]

## 2020-07-13 MED FILL — VIT D2 1.25 MG (50,000 UNIT: 1.25 MG | 90 days supply | Qty: 13 | Fill #2

## 2020-08-25 NOTE — Patient Instructions (Addendum)
hHealth Maintenance Due  Topic Date Due  . COLONOSCOPY (Pts 45-39yrs Insurance coverage will need to be confirmed) Please discuss with patient.  Never done   pap was negative/normal and HPV high risk negative on 08/23/2017- transition zone was absent though- I think its reasonable at next check to ask if repeat indicated with transition zone absent   With ALT elevation and risks to liver- lets have you reduce alcohol to one a day  Recommended follow up: Return in about 1 year (around 08/26/2021) for physical or sooner if needed.

## 2020-08-25 NOTE — Progress Notes (Signed)
Phone [phone]   Subjective:  Patient presents today for their annual physical. Chief complaint-noted.   See problem oriented charting- ROS- full  review of systems was completed and negative except for: some congestion after covid  The following were reviewed and entered/updated in epic: Past Medical History:  Diagnosis Date  . Migraine 08/19/2014   No recurrence after birth of son. Previously on flexeril.     Patient Active Problem List   Diagnosis Date Noted  . Venous insufficiency 06/11/2019    Priority: Medium  . History of squamous cell carcinoma 08/19/2014    Priority: Medium  . Seborrheic dermatitis 08/28/2017    Priority: Low  . Actinic keratosis 06/08/2015    Priority: Low  . Acromioclavicular sprain 04/29/2015    Priority: Low  . History of cervical cancer 08/19/2014    Priority: Low  . Vitamin D deficiency 08/19/2014    Priority: Low  . Former smoker 08/19/2014    Priority: Low  . Right knee pain 03/07/2019  . Encounter for counseling 11/22/2018  . Gluteal tendonitis of left buttock 11/05/2018  . Somatic dysfunction of left sacroiliac joint 10/24/2018  . Tendonitis 05/31/2018  . Olecranon bursitis of right elbow 05/31/2018  . Weakness of right hip 03/28/2018  . Nonallopathic lesion of thoracic region 03/28/2018  . Nonallopathic lesion of lumbosacral region 03/28/2018  . Nonallopathic lesion of sacral region 03/28/2018   Past Surgical History:  Procedure Laterality Date  . CERVICAL CONIZATION W/BX  1997   cold knife, no abnormal paps since.   Marland Kitchen VAGINAL DELIVERY  1998    Family History  Problem Relation Age of Onset  . Hypertension Mother   . Depression Mother        "depression in entire family"  . Heart attack Father 41       smoked marijuana for many years  . Hypertension Father   . Depression Father   . Breast cancer Maternal Grandmother        56s  . Diabetes Maternal Grandmother   . Dementia Maternal Grandmother     Medications-  reviewed and updated Current Outpatient Medications  Medication Sig Dispense Refill  . bimatoprost (LATISSE) 0.03 % ophthalmic solution Place one drop on applicator and apply evenly along the skin of the upper eyelid at base of eyelashes once daily at bedtime; repeat procedure for second eye (use a clean applicator). Patient may choose 3 ml size per preference 5 mL 12  . clobetasol ointment (TEMOVATE) 0.05 % clobetasol 0.05 % topical ointment    . imiquimod (ALDARA) 5 % cream Apply topically at bedtime.    Marland Kitchen levonorgestrel (MIRENA, 52 MG,) 20 MCG/24HR IUD Mirena 20 mcg/24 hours (7 yrs) 52 mg intrauterine device    . triamterene-hydrochlorothiazide (MAXZIDE-25) 37.5-25 MG tablet Take 0.5-1 tablets by mouth daily as needed (edema). 30 tablet 3  . Vitamin D, Ergocalciferol, (DRISDOL) 1.25 MG (50000 UNIT) CAPS capsule Take 1 capsule (50,000 Units total) by mouth every 7 (seven) days. 13 capsule 3  . fluticasone (FLONASE) 50 MCG/ACT nasal spray Place 2 sprays into both nostrils as needed. 16 g 11   No current facility-administered medications for this visit.    Allergies-reviewed and updated Allergies  Allergen Reactions  . Latex     swelling    Social History   Social History Narrative   Married 2015. Son [id number] [name]- also patient of Dr. Yong Channel.     Finished HS.       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 Oropharynx normal Neck: no thyromegaly CV: RRR no murmurs rubs or gallops Lungs: CTAB no crackles, wheeze, rhonchi Abdomen: soft/nontender/nondistended/normal bowel sounds.  No rebound or guarding.  Ext: no edema Skin: warm, dry Neuro: grossly normal, moves all extremities, PERRLA   Assessment and Plan   45 y.o. female presenting for annual physical.  Health Maintenance counseling: 1. Anticipatory guidance: Patient counseled regarding regular dental exams -q6 months, eye exams-wears reading glasses only now-no recent changes in vision- did go after covid - low grade prescription now,  avoiding smoking and second hand smoke , limiting alcohol to 1 beverage per day- 2 per day- discussed reducing .   2. Risk factor reduction:  Advised patient of need for regular exercise and diet rich and fruits and vegetables to reduce risk of heart attack and stroke. Exercise- shred 415 3-4 x a week- HIIT style- treadmill 15  and then onto deck 15 for weights then does it all it again- started in September 2020 and hs intensified lately. Diet-reasonably healthy.  Weight largely stable from last physical 2019 at 145 Wt Readings from Last 3 Encounters:  08/26/20 146 lb 3.2 oz (66.3 kg)  06/23/20 145 lb (65.8 kg)  05/17/20 142 lb (64.4 kg)  3. Immunizations/screenings/ancillary studies- up to date Immunization History  Administered Date(s) Administered  . Influenza,inj,Quad PF,6+ Mos 05/17/2019, 05/12/2020  . Influenza-Unspecified 05/21/2014, 05/21/2015, 05/03/2016, 04/30/2017  . PFIZER(Purple Top)SARS-COV-2 Vaccination 10/03/2019, 10/17/2019, 05/21/2020  . Td 08/08/2013  4. Cervical cancer screening- follows with Dr. [facility]- pap was negative/normal and HPV high risk negative on 08/23/2017- transition zone was absent though- I think its reasonable at next check to ask if repeat indicated with transition zone absent 5. Breast cancer screening-  breast exam with GYN and mammogram - she gets these done at the breast center-most recently February 2021 and required ultrasound as well-did have inflamed/ruptured cyst at that time 6. Colon cancer screening - we  discussed newer guidelines of colonoscopy at age 62-opts for referral  7. Skin cancer screening-sees [facility] dermatology Dr. Ronnald Ramp.  Multiple skin cancers in the past- sees in march. advised regular sunscreen use. Denies worrisome, changing, or new skin lesions.  8. Birth control/STD check- IUD for birth control.  Monogamous no STD screening not needed 9. Osteoporosis screening at 61- discussed we will plan on this at age 31 -FORMER smoker-quit in 5-not old enough for lung cancer screening.  She will not qualify unless they change guidelines- will monitor  Status of chronic or acute concerns   #Vitamin D deficiency S: Medication: Vit D 1.25Mg  50,000 units every 7 days. Last vitamin D Lab Results  Component Value Date   VD25OH 62.86 01/14/2020  A/P: With high-dose-we opted to check vitamin D today to make sure this does not get too high  #Screening hyperlipidemia- lipids looked excellent in 2017-now at 5-year interval we will rescreen Lab Results  Component Value Date   CHOL 151 12/28/2015   HDL 68.10 12/28/2015   Muddy  70 12/28/2015   TRIG 63.0 12/28/2015   CHOLHDL 2 12/28/2015    #COVID-19 August 04, 2019-she had planned vaccine in-mid January 2021-recommended she push this out at least a month.  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 Thankfully fatigue issues have resolved- feels like getting back in gym/more regular routine as helped  #Venous insufficiency- patient with ongoing issues with leg swelling- worse in summer- not using lately  #Musculoskeletal concerns-follows up with Dr. Tamala Julian as needed   # alelrgies- in spring needs this- doing ok right now  Recommended follow up: Return in about 1 year (around 08/26/2021) for physical or sooner if needed. Future Appointments  Date Time Provider [place]   09/16/2020  8:45 AM Cindra Presume, MD [facility]SS[facility] None   Lab/Order associations: not fasting   ICD-10-CM   1. Preventative health care  Z00.00 Ambulatory referral to Gastroenterology    VITAMIN D 25 Hydroxy (Vit-D Deficiency, Fractures)    CBC with Differential/Platelet    Comprehensive metabolic panel    Lipid panel    POCT Urinalysis Dipstick (Automated)  2. Former smoker  Z87.891 POCT Urinalysis Dipstick (Automated)  3. Vitamin D deficiency  E55.9 VITAMIN D 25 Hydroxy (Vit-D Deficiency, Fractures)  4. Encounter for screening colonoscopy  Z12.11 Ambulatory referral to Gastroenterology  5. Screening for hyperlipidemia  Z13.220 Lipid panel  6. History of squamous cell carcinoma  Z85.89   7. Macrocytosis  D75.89 CBC with Differential/Platelet  8. Elevated transaminase level  R74.01 Comprehensive metabolic panel    Meds ordered this encounter  Medications  . fluticasone (FLONASE) 50 MCG/ACT nasal spray    Sig: Place 2 sprays into both nostrils as needed.    Dispense:  16 g    Refill:  11    Return precautions advised.  Garret Reddish, MD

## 2020-08-26 ENCOUNTER — Encounter: Payer: Self-pay | Admitting: Family Medicine

## 2020-08-26 ENCOUNTER — Other Ambulatory Visit: Payer: Self-pay

## 2020-08-26 ENCOUNTER — Other Ambulatory Visit: Payer: Self-pay | Admitting: Family Medicine

## 2020-08-26 ENCOUNTER — Ambulatory Visit (INDEPENDENT_AMBULATORY_CARE_PROVIDER_SITE_OTHER): Payer: No Typology Code available for payment source | Admitting: Family Medicine

## 2020-08-26 VITALS — BP 128/70 | HR 70 | Temp 97.9°F | Ht 66.0 in | Wt 146.2 lb

## 2020-08-26 DIAGNOSIS — Z87891 Personal history of nicotine dependence: Secondary | ICD-10-CM

## 2020-08-26 DIAGNOSIS — E559 Vitamin D deficiency, unspecified: Secondary | ICD-10-CM

## 2020-08-26 DIAGNOSIS — Z1211 Encounter for screening for malignant neoplasm of colon: Secondary | ICD-10-CM | POA: Diagnosis not present

## 2020-08-26 DIAGNOSIS — Z0000 Encounter for general adult medical examination without abnormal findings: Secondary | ICD-10-CM

## 2020-08-26 DIAGNOSIS — Z8589 Personal history of malignant neoplasm of other organs and systems: Secondary | ICD-10-CM

## 2020-08-26 DIAGNOSIS — D7589 Other specified diseases of blood and blood-forming organs: Secondary | ICD-10-CM

## 2020-08-26 DIAGNOSIS — R7401 Elevation of levels of liver transaminase levels: Secondary | ICD-10-CM

## 2020-08-26 DIAGNOSIS — Z13220 Encounter for screening for lipoid disorders: Secondary | ICD-10-CM

## 2020-08-26 MED ORDER — FLUTICASONE PROPIONATE 50 MCG/ACT NA SUSP: 2.0000 | NASAL | 11 refills | Status: DC | PRN

## 2020-08-26 MED FILL — FLUTICASONE PROP 50 MCG SPR: 50 | 30 days supply | Qty: 16 | Fill #0

## 2020-08-27 ENCOUNTER — Other Ambulatory Visit (INDEPENDENT_AMBULATORY_CARE_PROVIDER_SITE_OTHER): Payer: No Typology Code available for payment source

## 2020-08-27 DIAGNOSIS — Z0000 Encounter for general adult medical examination without abnormal findings: Secondary | ICD-10-CM | POA: Diagnosis not present

## 2020-08-27 DIAGNOSIS — Z13220 Encounter for screening for lipoid disorders: Secondary | ICD-10-CM

## 2020-08-27 DIAGNOSIS — Z87891 Personal history of nicotine dependence: Secondary | ICD-10-CM

## 2020-08-27 DIAGNOSIS — R7401 Elevation of levels of liver transaminase levels: Secondary | ICD-10-CM | POA: Diagnosis not present

## 2020-08-27 DIAGNOSIS — E559 Vitamin D deficiency, unspecified: Secondary | ICD-10-CM

## 2020-08-27 DIAGNOSIS — D7589 Other specified diseases of blood and blood-forming organs: Secondary | ICD-10-CM

## 2020-08-27 LAB — COMPREHENSIVE METABOLIC PANEL
ALT: 44 U/L — ABNORMAL HIGH (ref 0–35)
AST: 40 U/L — ABNORMAL HIGH (ref 0–37)
Albumin: 4.7 g/dL (ref 3.5–5.2)
Alkaline Phosphatase: 54 U/L (ref 39–117)
BUN: 12 mg/dL (ref 6–23)
CO2: 28 mEq/L (ref 19–32)
Calcium: 9.6 mg/dL (ref 8.4–10.5)
Chloride: 101 mEq/L (ref 96–112)
Creatinine, Ser: 0.77 mg/dL (ref 0.40–1.20)
GFR: 92.77 mL/min (ref >60.00)
Glucose, Bld: 80 mg/dL (ref 70–99)
Potassium: 4.5 mEq/L (ref 3.5–5.1)
Sodium: 137 mEq/L (ref 135–145)
Total Bilirubin: 0.7 mg/dL (ref 0.2–1.2)
Total Protein: 7.3 g/dL (ref 6.0–8.3)

## 2020-08-27 LAB — LIPID PANEL
Cholesterol: 160 mg/dL (ref 0–200)
HDL: 105.7 mg/dL (ref >39.00)
LDL Cholesterol: 44 mg/dL (ref 0–99)
NonHDL: 54.67
Total CHOL/HDL Ratio: 2
Triglycerides: 51 mg/dL (ref 0.0–149.0)
VLDL: 10.2 mg/dL (ref 0.0–40.0)

## 2020-08-27 LAB — CBC WITH DIFFERENTIAL/PLATELET
Basophils Absolute: 0 10*3/uL (ref 0.0–0.1)
Basophils Relative: 0.4 % (ref 0.0–3.0)
Eosinophils Absolute: 0.1 10*3/uL (ref 0.0–0.7)
Eosinophils Relative: 0.9 % (ref 0.0–5.0)
HCT: 42.8 % (ref 36.0–46.0)
Hemoglobin: 14.5 g/dL (ref 12.0–15.0)
Lymphocytes Relative: 20.1 % (ref 12.0–46.0)
Lymphs Abs: 1.4 10*3/uL (ref 0.7–4.0)
MCHC: 33.8 g/dL (ref 30.0–36.0)
MCV: 100.7 fl — ABNORMAL HIGH (ref 78.0–100.0)
Monocytes Absolute: 0.5 10*3/uL (ref 0.1–1.0)
Monocytes Relative: 7.4 % (ref 3.0–12.0)
Neutro Abs: 5 10*3/uL (ref 1.4–7.7)
Neutrophils Relative %: 71.2 % (ref 43.0–77.0)
Platelets: 246 10*3/uL (ref 150.0–400.0)
RBC: 4.25 Mil/uL (ref 3.87–5.11)
RDW: 13.8 % (ref 11.5–15.5)
WBC: 7 10*3/uL (ref 4.0–10.5)

## 2020-08-27 LAB — POC URINALSYSI DIPSTICK (AUTOMATED)
Bilirubin, UA: NEGATIVE
Blood, UA: NEGATIVE
Glucose, UA: NEGATIVE
Ketones, UA: POSITIVE
Leukocytes, UA: NEGATIVE
Nitrite, UA: NEGATIVE
Protein, UA: POSITIVE — AB
Spec Grav, UA: 1.025 (ref 1.010–1.025)
Urobilinogen, UA: 0.2 E.U./dL
pH, UA: 6.5 (ref 5.0–8.0)

## 2020-08-27 LAB — VITAMIN D 25 HYDROXY (VIT D DEFICIENCY, FRACTURES): VITD: 87.21 ng/mL (ref 30.00–100.00)

## 2020-09-16 ENCOUNTER — Ambulatory Visit (INDEPENDENT_AMBULATORY_CARE_PROVIDER_SITE_OTHER): Payer: Self-pay | Admitting: Plastic Surgery

## 2020-09-16 ENCOUNTER — Other Ambulatory Visit: Payer: Self-pay

## 2020-09-16 ENCOUNTER — Encounter: Payer: Self-pay | Admitting: Plastic Surgery

## 2020-09-16 VITALS — BP 119/69 | HR 73

## 2020-09-16 DIAGNOSIS — Z411 Encounter for cosmetic surgery: Secondary | ICD-10-CM

## 2020-09-16 NOTE — Progress Notes (Signed)
Patient presents for Botox and injection.  She had 40 units of Botox previously distributed between the forehead, glabella and crows feet and liked that result.   We reviewed the risks and benefits of filler and Botox injection and she agrees to proceed.  The forehead, glabella and crows feet were prepped with an alcohol pad and 40 units of Botox were distributed throughout.  She tolerated this fine.  We will plan to see her at her next visit or sooner if she has any issues.

## 2020-09-22 ENCOUNTER — Encounter: Payer: Self-pay | Admitting: Gastroenterology

## 2020-10-04 MED FILL — FLUTICASONE PROP 50 MCG SPR: 50 | 30 days supply | Qty: 16 | Fill #1

## 2020-11-02 ENCOUNTER — Other Ambulatory Visit (HOSPITAL_BASED_OUTPATIENT_CLINIC_OR_DEPARTMENT_OTHER): Payer: Self-pay | Admitting: Dermatology

## 2020-11-02 MED FILL — IMIQUIMOD 5 % CREA: 5 | 33 days supply | Qty: 24 | Fill #0

## 2020-11-06 ENCOUNTER — Other Ambulatory Visit (HOSPITAL_COMMUNITY): Payer: Self-pay

## 2020-11-08 ENCOUNTER — Ambulatory Visit (INDEPENDENT_AMBULATORY_CARE_PROVIDER_SITE_OTHER): Payer: Self-pay | Admitting: Plastic Surgery

## 2020-11-08 ENCOUNTER — Other Ambulatory Visit: Payer: Self-pay

## 2020-11-08 ENCOUNTER — Ambulatory Visit (INDEPENDENT_AMBULATORY_CARE_PROVIDER_SITE_OTHER): Payer: Self-pay

## 2020-11-08 VITALS — BP 128/82 | HR 68 | Temp 97.7°F | Ht 66.0 in | Wt 145.0 lb

## 2020-11-08 DIAGNOSIS — Z411 Encounter for cosmetic surgery: Secondary | ICD-10-CM

## 2020-11-08 DIAGNOSIS — Z719 Counseling, unspecified: Secondary | ICD-10-CM

## 2020-11-08 NOTE — Patient Instructions (Signed)
Pt will use sunscreen & moisturizer She understands that she may have dry/red skin for approximately 5 days Call for any concerns F/U in 4-6 weeks

## 2020-11-08 NOTE — Progress Notes (Signed)
Patient presents for Botox treatment.  She complains of static and dynamic lines in the forehead, Labelle and crows feet.  We have injected her in the past with 40 units of Botox.  She is interested in trying Dysport this time.  She initially had a great result with the Botox last time but feels like the forehead has worn off faster than usual.  We discussed the risks and benefits of Botox treatment and she is interested in moving forward.  The forehead, Labelle and crows feet were prepped with an alcohol pad.  120 units of Dysport were distributed throughout the forehead, Bell and crows feet.  She tolerated this fine.  We will see her at her next visit.

## 2020-11-08 NOTE — Progress Notes (Signed)
Preoperative Dx: hyperpigmentation areas to bilateral lower legs  Postoperative Dx:  same  Procedure: laser to bilateral lower legs  Anesthesia:  none  Description of Procedure:  Risks and complications were explained to the patient. Consent was confirmed and signed. Time out was called and all information was confirmed to be correct. The area  was prepped with alcohol and wiped dry.  The IPL laser was set at the following:  skin -1/ suntan- none - 6.2J Aloe gel applied Pt tolerated procedure well

## 2020-11-17 ENCOUNTER — Ambulatory Visit (AMBULATORY_SURGERY_CENTER): Payer: Self-pay | Admitting: *Deleted

## 2020-11-17 ENCOUNTER — Other Ambulatory Visit: Payer: Self-pay

## 2020-11-17 ENCOUNTER — Other Ambulatory Visit (HOSPITAL_BASED_OUTPATIENT_CLINIC_OR_DEPARTMENT_OTHER): Payer: Self-pay

## 2020-11-17 VITALS — Ht 65.0 in | Wt 149.0 lb

## 2020-11-17 DIAGNOSIS — Z1211 Encounter for screening for malignant neoplasm of colon: Secondary | ICD-10-CM

## 2020-11-17 MED ORDER — SUPREP BOWEL PREP KIT 17.5-3.13-1.6 GM/177ML PO SOLN
1.0000 | Freq: Once | ORAL | 0 refills | Status: AC
  Filled 2020-11-17: qty 354, 1d supply, fill #0

## 2020-11-17 NOTE — Progress Notes (Signed)

## 2020-12-01 ENCOUNTER — Ambulatory Visit (AMBULATORY_SURGERY_CENTER): Payer: No Typology Code available for payment source | Admitting: Gastroenterology

## 2020-12-01 ENCOUNTER — Encounter: Payer: Self-pay | Admitting: Gastroenterology

## 2020-12-01 ENCOUNTER — Other Ambulatory Visit: Payer: Self-pay

## 2020-12-01 VITALS — BP 110/68 | HR 65 | Temp 97.3°F | Resp 9 | Ht 66.0 in | Wt 149.0 lb

## 2020-12-01 DIAGNOSIS — Z1211 Encounter for screening for malignant neoplasm of colon: Secondary | ICD-10-CM

## 2020-12-01 MED ORDER — SODIUM CHLORIDE 0.9 % IV SOLN: 500.0000 mL | Freq: Once | INTRAVENOUS | Status: DC

## 2020-12-01 NOTE — Patient Instructions (Signed)
YOU HAD AN ENDOSCOPIC PROCEDURE TODAY AT THE [facility]:   Refer to the procedure report that was given to you for any specific questions about what was found during the examination.  If the procedure report does not answer your questions, please call your gastroenterologist to clarify.  If you requested that your care partner not be given the details of your procedure findings, then the procedure report has been included in a sealed envelope for you to review at your convenience later. ° °YOU SHOULD EXPECT: Some feelings of bloating in the abdomen. Passage of more gas than usual.  Walking can help get rid of the air that was put into your GI tract during the procedure and reduce the bloating. If you had a lower endoscopy (such as a colonoscopy or flexible sigmoidoscopy) you may notice spotting of blood in your stool or on the toilet paper. If you underwent a bowel prep for your procedure, you may not have a normal bowel movement for a few days. ° °Please Note:  You might notice some irritation and congestion in your nose or some drainage.  This is from the oxygen used during your procedure.  There is no need for concern and it should clear up in a day or so. ° °SYMPTOMS TO REPORT IMMEDIATELY: ° °Following lower endoscopy (colonoscopy or flexible sigmoidoscopy): ° Excessive amounts of blood in the stool ° Significant tenderness or worsening of abdominal pains ° Swelling of the abdomen that is new, acute ° Fever of 100°F or higher ° °For urgent or emergent issues, a gastroenterologist can be reached at any hour by calling [phone]. °Do not use MyChart messaging for urgent concerns.  ° ° °DIET:  We do recommend a small meal at first, but then you may proceed to your regular diet.  Drink plenty of fluids but you should avoid alcoholic beverages for 24 hours. ° °ACTIVITY:  You should plan to take it easy for the rest of today and you should NOT DRIVE or use heavy machinery until tomorrow (because of  the sedation medicines used during the test).   ° °FOLLOW UP: °Our staff will call the number listed on your records 48-72 hours following your procedure to check on you and address any questions or concerns that you may have regarding the information given to you following your procedure. If we do not reach you, we will leave a message.  We will attempt to reach you two times.  During this call, we will ask if you have developed any symptoms of COVID 19. If you develop any symptoms (ie: fever, flu-like symptoms, shortness of breath, cough etc.) before then, please call [phone].  If you test positive for Covid 19 in the 2 weeks post procedure, please call and report this information to us.   ° °SIGNATURES/CONFIDENTIALITY: °You and/or your care partner have signed paperwork which will be entered into your electronic medical record.  These signatures attest to the fact that that the information above on your After Visit Summary has been reviewed and is understood.  Full responsibility of the confidentiality of this discharge information lies with you and/or your care-partner.  °

## 2020-12-01 NOTE — Op Note (Signed)
[place] Patient Name: Megan Mcmillan Procedure Date: 12/01/2020 7:55 AM MRN: [id number] Endoscopist: Ladene Artist , MD Age: 45 Referring MD:  Date of Birth: 1976-05-16 Gender: Female Account #: [id number] Procedure:                Colonoscopy Indications:              Screening for colorectal malignant neoplasm Medicines:                Monitored Anesthesia Care Procedure:                Pre-Anesthesia Assessment:                           - Prior to the procedure, a History and Physical                            was performed, and patient medications and                            allergies were reviewed. The patient's tolerance of                            previous anesthesia was also reviewed. The risks                            and benefits of the procedure and the sedation                            options and risks were discussed with the patient.                            All questions were answered, and informed consent                            was obtained. Prior Anticoagulants: The patient has                            taken no previous anticoagulant or antiplatelet                            agents. ASA Grade Assessment: I - A normal, healthy                            patient. After reviewing the risks and benefits,                            the patient was deemed in satisfactory condition to                            undergo the procedure.                           After obtaining informed consent, the colonoscope  was passed under direct vision. Throughout the                            procedure, the patient's blood pressure, pulse, and                            oxygen saturations were monitored continuously. The                            Olympus CF-HQ190L ([id number]) Colonoscope was                            introduced through the anus and advanced to the the                            cecum, identified by  appendiceal orifice and                            ileocecal valve. The ileocecal valve, appendiceal                            orifice, and rectum were photographed. The quality                            of the bowel preparation was good. The colonoscopy                            was performed without difficulty. The patient                            tolerated the procedure well. Scope In: 8:02:58 AM Scope Out: 8:22:20 AM Scope Withdrawal Time: 0 hours 12 minutes 4 seconds  Total Procedure Duration: 0 hours 19 minutes 22 seconds  Findings:                 The perianal and digital rectal examinations were                            normal.                           The entire examined colon appeared normal on direct                            and retroflexion views. Complications:            No immediate complications. Estimated blood loss:                            None. Estimated Blood Loss:     Estimated blood loss: none. Impression:               - The entire examined colon is normal on direct and                            retroflexion views.                           -  No specimens collected. Recommendation:           - Repeat colonoscopy in 10 years for screening                            purposes.                           - Patient has a contact number available for                            emergencies. The signs and symptoms of potential                            delayed complications were discussed with the                            patient. Return to normal activities tomorrow.                            Written discharge instructions were provided to the                            patient.                           - Resume previous diet.                           - Continue present medications. Ladene Artist, MD 12/01/2020 8:25:09 AM This report has been signed electronically.

## 2020-12-01 NOTE — Progress Notes (Signed)
Vitals-CW  Pt's states no medical or surgical changes since previsit or office visit. 

## 2020-12-01 NOTE — Progress Notes (Signed)
Report given to PACU, vss 

## 2020-12-03 ENCOUNTER — Telehealth: Payer: Self-pay

## 2020-12-03 NOTE — Telephone Encounter (Signed)
Left message on 2nd follow up call. 

## 2020-12-03 NOTE — Telephone Encounter (Signed)
Left message on follow up call. 

## 2020-12-14 ENCOUNTER — Other Ambulatory Visit (HOSPITAL_BASED_OUTPATIENT_CLINIC_OR_DEPARTMENT_OTHER): Payer: Self-pay

## 2020-12-14 MED FILL — Fluticasone Propionate Nasal Susp 50 MCG/ACT: NASAL | 30 days supply | Qty: 16 | Fill #0 | Status: AC

## 2021-01-10 ENCOUNTER — Other Ambulatory Visit (HOSPITAL_BASED_OUTPATIENT_CLINIC_OR_DEPARTMENT_OTHER): Payer: Self-pay

## 2021-01-10 MED ORDER — TRIAMTERENE-HCTZ 37.5-25 MG PO TABS
ORAL_TABLET | ORAL | 3 refills | Status: DC
  Filled 2021-01-10: qty 30, 30d supply, fill #0

## 2021-01-11 ENCOUNTER — Encounter: Payer: Self-pay | Admitting: Family Medicine

## 2021-01-11 ENCOUNTER — Other Ambulatory Visit: Payer: Self-pay

## 2021-01-11 ENCOUNTER — Ambulatory Visit (INDEPENDENT_AMBULATORY_CARE_PROVIDER_SITE_OTHER): Payer: No Typology Code available for payment source | Admitting: Family Medicine

## 2021-01-11 VITALS — BP 104/80 | HR 80 | Ht 66.0 in | Wt 147.0 lb

## 2021-01-11 DIAGNOSIS — M9904 Segmental and somatic dysfunction of sacral region: Secondary | ICD-10-CM | POA: Diagnosis not present

## 2021-01-11 DIAGNOSIS — M9902 Segmental and somatic dysfunction of thoracic region: Secondary | ICD-10-CM

## 2021-01-11 DIAGNOSIS — M9903 Segmental and somatic dysfunction of lumbar region: Secondary | ICD-10-CM

## 2021-01-11 NOTE — Assessment & Plan Note (Signed)
Continued difficulty.  Has not been seen in quite some time for this.  Discussed icing regimen and home exercises.  Responded well to osteopathic manipulation.  Patient does not have any medications for it and continues to respond well to the over-the-counter.  If worsening pain she can call us.  We will hold on any type of imaging with patient doing so well over the course the last 2 years.  Follow-up with me again in 6 to 8 weeks if needed.

## 2021-01-11 NOTE — Patient Instructions (Addendum)
Good to see you Hip flexor exercises Use them as cool down Avoid overhand lifting Enjoy the concert See me again in 6-8 weeks

## 2021-01-11 NOTE — Progress Notes (Signed)
[facility] [address] [place] Phone: [phone] Subjective:   I [name] am serving as a Education administrator for Dr. Hulan Saas.  This visit occurred during the SARS-CoV-2 public health emergency.  Safety protocols were in place, including screening questions prior to the visit, additional usage of staff PPE, and extensive cleaning of exam room while observing appropriate contact time as indicated for disinfecting solutions.   I'm seeing this patient by the request  of:  Marin Olp, MD  CC:    [id number]   05/06/2019 Responding very well to conservative therapy.  Discussed posture and ergonomics again at great length.  Responds well.  New patient.  Patient is doing better with the workout routine with her new gym.  Follow-up again 2 months  01/11/2021 JULAINE ZIMNY is a 45 y.o. female coming in with complaint of back pain. Last seen 05/06/2019 for SI joint. States the left feels different from the right. States the pain is in her lower back. States she is tight down her left side. States she sits more to the left.  Patient denies any true injury.  Has been fairly active working out most days of the week.     Past Medical History:  Diagnosis Date  . Migraine 08/19/2014   No recurrence after birth of son. Previously on flexeril.     Past Surgical History:  Procedure Laterality Date  . CERVICAL CONIZATION W/BX  1997   cold knife, no abnormal paps since.   Marland Kitchen VAGINAL DELIVERY  1998   Social History   Socioeconomic History  . Marital status: Married    Spouse name: Not on file  . Number of children: Not on file  . Years of education: Not on file  . Highest education level: Not on file  Occupational History  . Not on file  Tobacco Use  . Smoking status: Former Smoker    Packs/day: 1.00    Years: 21.00    Pack years: 21.00    Types: Cigarettes    Quit date: 11/06/2010    Years since quitting: 10.1  . Smokeless tobacco:  Never Used  Vaping Use  . Vaping Use: Never used  Substance and Sexual Activity  . Alcohol use: Yes    Alcohol/week: 5.0 standard drinks    Types: 5 Standard drinks or equivalent per week  . Drug use: No  . Sexual activity: Not on file  Other Topics Concern  . Not on file  Social History Narrative   Married 2015. Son [id number] [name]- also patient of Dr. Yong Channel.     Finished HS.       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 Latex     swelling   Family History  Problem Relation Age of Onset  . Hypertension Mother   . Depression Mother        "depression in entire family"  . Heart attack Father 41       smoked marijuana for many years  .  Hypertension Father   . Depression Father   . Breast cancer Maternal Grandmother        59s  . Diabetes Maternal Grandmother   . Dementia Maternal Grandmother   . Colon cancer Neg Hx   . Colon polyps Neg Hx   . Esophageal cancer Neg Hx   . Stomach cancer Neg Hx   . Rectal cancer Neg Hx     Current Outpatient Medications (Endocrine & Metabolic):  .  levonorgestrel (MIRENA, 52 MG,) 20 MCG/24HR IUD, Mirena 20 mcg/24 hours (7 yrs) 52 mg intrauterine device  Current Outpatient Medications (Cardiovascular):  .  triamterene-hydrochlorothiazide (MAXZIDE-25) 37.5-25 MG tablet, Take 0.5-1 tablets by mouth daily as needed (edema). .  triamterene-hydrochlorothiazide (MAXZIDE-25) 37.5-25 MG tablet, Take 1/2 - 1 tablet by mouth daily as needed for edema  Current Outpatient Medications (Respiratory):  .   fluticasone (FLONASE) 50 MCG/ACT nasal spray, PLACE 2 SPRAYS INTO BOTH NOSTRILS AS NEEDED.    Current Outpatient Medications (Other):  .  bimatoprost (LATISSE) 0.03 % ophthalmic solution, Place one drop on applicator and apply evenly along the skin of the upper eyelid at base of eyelashes once daily at bedtime; repeat procedure for second eye (use a clean applicator). Patient may choose 3 ml size per preference .  clobetasol ointment (TEMOVATE) 0.05 %, clobetasol 0.05 % topical ointment .  COVID-19 mRNA vaccine, Pfizer, 30 MCG/0.3ML injection, INJECT AS DIRECTED .  imiquimod (ALDARA) 5 % cream, Apply topically at bedtime. .  imiquimod (ALDARA) 5 % cream, APPLY 1 APPLICATION ON TO THE BACK NIGHTLY MONDAY - FRIDAYS FOR 4 WEEKS .  imiquimod (ALDARA) 5 % cream, APPLY TO AREA ON BACK NIGHTLY MON-FRI (WEEKENDS OFF) FOR 4 WEEKS   Reviewed prior external information including notes and imaging from  primary care provider As well as notes that were available from care everywhere and other healthcare systems.  Past medical history, social, surgical and family history all reviewed in electronic medical record.  No pertanent information unless stated regarding to the chief complaint.   Review of Systems:  No headache, visual changes, nausea, vomiting, diarrhea, constipation, dizziness, abdominal pain, skin rash, fevers, chills, night sweats, weight loss, swollen lymph nodes, body aches, joint swelling, chest pain, shortness of breath, mood changes. POSITIVE muscle aches  Objective  Blood pressure 104/80, pulse 80, height 5\' 6"  (1.676 m), weight 147 lb (66.7 kg), SpO2 99 %.   General: No apparent distress alert and oriented x3 mood and affect normal, dressed appropriately.  HEENT: Pupils equal, extraocular movements intact  Respiratory: Patient's speak in full sentences and does not appear short of breath  Cardiovascular: No lower extremity edema, non tender, no erythema  Gait normal with good  balance and coordination.  MSK: Low back exam does have tightness noted the paraspinal musculature left greater than right.  Mild tightness noted of the [facility] on the left side.  Negative straight leg test.  Tender to palpation in the parascapular region as well on the left side.  Osteopathic findings T8 extended rotated and side bent left L2 flexed rotated and side bent Sacrum left on left   Impression and Recommendations:     The above documentation has been reviewed and is accurate and complete Lyndal Pulley, DO

## 2021-02-02 ENCOUNTER — Ambulatory Visit (INDEPENDENT_AMBULATORY_CARE_PROVIDER_SITE_OTHER): Payer: Self-pay | Admitting: Plastic Surgery

## 2021-02-02 ENCOUNTER — Other Ambulatory Visit: Payer: Self-pay

## 2021-02-02 DIAGNOSIS — Z411 Encounter for cosmetic surgery: Secondary | ICD-10-CM

## 2021-02-02 NOTE — Progress Notes (Signed)
Patient presents to discuss Botox treatment.  In the past we have done 40 units of Botox.  We have also tried 120 units of Dysport previously.  This dosing seems to work for her.  We discussed the risk and benefits of Botox treatment and she is interested in moving forward.  The forehead, glabella and crows feet were prepped with an alcohol pad and 40 units of Botox were distributed.  She tolerated this well.  We will see her again at her next visit.

## 2021-02-23 ENCOUNTER — Ambulatory Visit: Payer: No Typology Code available for payment source | Admitting: Family Medicine

## 2021-02-24 ENCOUNTER — Other Ambulatory Visit (HOSPITAL_BASED_OUTPATIENT_CLINIC_OR_DEPARTMENT_OTHER): Payer: Self-pay

## 2021-02-24 MED FILL — Fluticasone Propionate Nasal Susp 50 MCG/ACT: NASAL | 30 days supply | Qty: 16 | Fill #1 | Status: AC

## 2021-03-30 ENCOUNTER — Encounter: Payer: Self-pay | Admitting: Family Medicine

## 2021-03-30 ENCOUNTER — Other Ambulatory Visit (HOSPITAL_BASED_OUTPATIENT_CLINIC_OR_DEPARTMENT_OTHER): Payer: Self-pay

## 2021-03-30 ENCOUNTER — Ambulatory Visit (INDEPENDENT_AMBULATORY_CARE_PROVIDER_SITE_OTHER): Payer: No Typology Code available for payment source | Admitting: Family Medicine

## 2021-03-30 ENCOUNTER — Other Ambulatory Visit: Payer: Self-pay

## 2021-03-30 VITALS — BP 110/60 | HR 72 | Ht 66.0 in | Wt 152.0 lb

## 2021-03-30 DIAGNOSIS — M9902 Segmental and somatic dysfunction of thoracic region: Secondary | ICD-10-CM

## 2021-03-30 DIAGNOSIS — M9908 Segmental and somatic dysfunction of rib cage: Secondary | ICD-10-CM | POA: Diagnosis not present

## 2021-03-30 DIAGNOSIS — M9903 Segmental and somatic dysfunction of lumbar region: Secondary | ICD-10-CM | POA: Diagnosis not present

## 2021-03-30 DIAGNOSIS — M9901 Segmental and somatic dysfunction of cervical region: Secondary | ICD-10-CM | POA: Diagnosis not present

## 2021-03-30 DIAGNOSIS — M9904 Segmental and somatic dysfunction of sacral region: Secondary | ICD-10-CM | POA: Diagnosis not present

## 2021-03-30 DIAGNOSIS — R29898 Other symptoms and signs involving the musculoskeletal system: Secondary | ICD-10-CM | POA: Diagnosis not present

## 2021-03-30 MED ORDER — MELOXICAM 7.5 MG PO TABS
7.5000 mg | ORAL_TABLET | Freq: Every day | ORAL | 0 refills | Status: DC
  Filled 2021-03-30: qty 30, 30d supply, fill #0

## 2021-03-30 MED ORDER — TIZANIDINE HCL 2 MG PO TABS
2.0000 mg | ORAL_TABLET | Freq: Every day | ORAL | 0 refills | Status: DC
  Filled 2021-03-30: qty 30, 30d supply, fill #0

## 2021-03-30 NOTE — Progress Notes (Signed)
[place] Phone: [phone] Subjective:    I'm seeing this patient by the request  of:  Marin Olp, MD  CC:   [id number]  LATASHIA TIMBERLAKE is a 45 y.o. female coming in with complaint of Low back and right hip pain. The right hip is very tight worse when sitting or laying. Low back pain on the right side last night.        Past Medical History:  Diagnosis Date   Migraine 08/19/2014   No recurrence after birth of son. Previously on flexeril.     Past Surgical History:  Procedure Laterality Date   CERVICAL CONIZATION W/BX  1997   cold knife, no abnormal paps since.    VAGINAL DELIVERY  1998   Social History   Socioeconomic History   Marital status: Married    Spouse name: Not on file   Number of children: Not on file   Years of education: Not on file   Highest education level: Not on file  Occupational History   Not on file  Tobacco Use   Smoking status: Former    Packs/day: 1.00    Years: 21.00    Pack years: 21.00    Types: Cigarettes    Quit date: 11/06/2010    Years since quitting: 10.4   Smokeless tobacco: Never  Vaping Use   Vaping Use: Never used  Substance and Sexual Activity   Alcohol use: Yes    Alcohol/week: 5.0 standard drinks    Types: 5 Standard drinks or equivalent per week   Drug use: No   Sexual activity: Not on file  Other Topics Concern   Not on file  Social History Narrative   Married 2015. Son [id number] [name]- also patient of Dr. Yong Channel.     Finished HS.       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 [employer]   In the [employer], [place], Occupational psychologist, billing and coding-diverse medical background      Hobbies: football, reading, cooking, enjoys cleaning      Social Determinants of Radio broadcast assistant Strain: Not on [employer] Insecurity: Not on file  Transportation Needs: Not on file  Physical Activity: Not on file  Stress:  Not on file  Social Connections: Not on file   Allergies  Allergen Reactions   Latex     swelling   Family History  Problem Relation Age of Onset   Hypertension Mother    Depression Mother        "depression in entire family"   Heart attack Father 61       smoked marijuana for many years   Hypertension Father    Depression Father    Breast cancer Maternal Grandmother        60s   Diabetes Maternal Grandmother    Dementia Maternal Grandmother    Colon cancer Neg Hx    Colon polyps Neg Hx    Esophageal cancer Neg Hx    Stomach cancer Neg Hx    Rectal cancer Neg Hx     Current Outpatient Medications (Endocrine & Metabolic):    levonorgestrel (MIRENA, 52 MG,) 20 MCG/24HR IUD, Mirena 20 mcg/24 hours (7 yrs) 52 mg intrauterine device  Current Outpatient Medications (Cardiovascular):    triamterene-hydrochlorothiazide (MAXZIDE-25) 37.5-25 MG tablet, Take 0.5-1 tablets by mouth daily as needed (edema).   triamterene-hydrochlorothiazide (MAXZIDE-25) 37.5-25 MG tablet, Take 1/2 -  1 tablet by mouth daily as needed for edema  Current Outpatient Medications (Respiratory):    fluticasone (FLONASE) 50 MCG/ACT nasal spray, PLACE 2 SPRAYS INTO BOTH NOSTRILS AS NEEDED.    Current Outpatient Medications (Other):    bimatoprost (LATISSE) 0.03 % ophthalmic solution, Place one drop on applicator and apply evenly along the skin of the upper eyelid at base of eyelashes once daily at bedtime; repeat procedure for second eye (use a clean applicator). Patient may choose 3 ml size per preference   clobetasol ointment (TEMOVATE) 0.05 %, clobetasol 0.05 % topical ointment   COVID-19 mRNA vaccine, Pfizer, 30 MCG/0.3ML injection, INJECT AS DIRECTED   imiquimod (ALDARA) 5 % cream, Apply topically at bedtime.   imiquimod (ALDARA) 5 % cream, APPLY 1 APPLICATION ON TO THE BACK NIGHTLY MONDAY - FRIDAYS FOR 4 WEEKS   imiquimod (ALDARA) 5 % cream, APPLY TO AREA ON BACK NIGHTLY MON-FRI (WEEKENDS OFF) FOR 4  WEEKS   Reviewed prior external information including notes and imaging from  primary care provider As well as notes that were available from care everywhere and other healthcare systems.  Past medical history, social, surgical and family history all reviewed in electronic medical record.  No pertanent information unless stated regarding to the chief complaint.   Review of Systems:  No headache, visual changes, nausea, vomiting, diarrhea, constipation, dizziness, abdominal pain, skin rash, fevers, chills, night sweats, weight loss, swollen lymph nodes, body aches, joint swelling, chest pain, shortness of breath, mood changes. POSITIVE muscle aches  Objective  Height '5\' 6"'$  (1.676 m).   General: No apparent distress alert and oriented x3 mood and affect normal, dressed appropriately.  HEENT: Pupils equal, extraocular movements intact  Respiratory: Patient's speak in full sentences and does not appear short of breath  Cardiovascular: No lower extremity edema, non tender, no erythema  Gait normal with good balance and coordination.  MSK:  Non tender with full range of motion and good stability and symmetric strength and tone of shoulders, elbows, wrist, hip, knee and ankles bilaterally.     Impression and Recommendations:     The above documentation has been reviewed and is accurate and complete Pollyann Glen, CMA

## 2021-03-30 NOTE — Assessment & Plan Note (Signed)
Patient is having more tightness with some mild weakness noted.  Discussed with patient at great length.  Patient has responded to manipulation previously.  Did respond again today.  Discussed continuing with the hip abductor strengthening.  Patient has had more of a tendinitis as well we will need to continue to monitor.  Follow-up with me again in 4 to 6 weeks.  Patient given meloxicam as well as temazepam.  Shoulder pain.

## 2021-03-30 NOTE — Progress Notes (Signed)
[place] Phone: [phone] Subjective:    I'm seeing this patient by the request  of:  Marin Olp, MD  CC: Back pain and neck pain follow-up  [id number]  Megan Mcmillan is a 45 y.o. female coming in with complaint of back and neck pain Patient states started having right hip pain.  Has had this before but is seem to be worsening.  Mild radiation of pain going down the leg at the moment.  Patient is responding to manipulation previously.  Medications patient has been prescribed:   Taking:         Reviewed prior external information including notes and imaging from previsou exam, outside providers and external EMR if available.   As well as notes that were available from care everywhere and other healthcare systems.  Past medical history, social, surgical and family history all reviewed in electronic medical record.  No pertanent information unless stated regarding to the chief complaint.   Past Medical History:  Diagnosis Date   Migraine 08/19/2014   No recurrence after birth of son. Previously on flexeril.      Allergies  Allergen Reactions   Latex     swelling     Review of Systems:  No headache, visual changes, nausea, vomiting, diarrhea, constipation, dizziness, abdominal pain, skin rash, fevers, chills, night sweats, weight loss, swollen lymph nodes, body aches, joint swelling, chest pain, shortness of breath, mood changes. POSITIVE muscle aches  Objective  Height '5\' 6"'$  (1.676 m).   General: No apparent distress alert and oriented x3 mood and affect normal, dressed appropriately.  HEENT: Pupils equal, extraocular movements intact  Respiratory: Patient's speak in full sentences and does not appear short of breath  Cardiovascular: No lower extremity edema, non tender, no erythema  Low back exam does have some mild loss lordosis.  Some tenderness to palpation is noted in the  paraspinal musculature.  Patient does have some tightness of the piriformis noted right greater than left.  Negative straight leg test.  Mild weakness of the hip abductors noted.  Osteopathic findings  C2 flexed rotated and side bent right C6 flexed rotated and side bent left T3 extended rotated and side bent right inhaled rib T9 extended rotated and side bent left L2 flexed rotated and side bent right Sacrum right on right       Assessment and Plan:  Weakness of right hip Patient is having more tightness with some mild weakness noted.  Discussed with patient at great length.  Patient has responded to manipulation previously.  Did respond again today.  Discussed continuing with the hip abductor strengthening.  Patient has had more of a tendinitis as well we will need to continue to monitor.  Follow-up with me again in 4 to 6 weeks.  Patient given meloxicam as well as temazepam.  Shoulder pain.   Nonallopathic problems  Decision today to treat with OMT was based on Physical Exam  After verbal consent patient was treated with HVLA, ME, FPR techniques in cervical, rib, thoracic, lumbar, and sacral  areas  Patient tolerated the procedure well with improvement in symptoms  Patient given exercises, stretches and lifestyle modifications  See medications in patient instructions if given  Patient will follow up in 4-8 weeks      The above documentation has been reviewed and is accurate and complete Lyndal Pulley, DO        Note: This dictation was prepared  with Dragon dictation along with smaller phrase technology. Any transcriptional errors that result from this process are unintentional.

## 2021-03-30 NOTE — Patient Instructions (Signed)
Good to see you  Exercises given  Meloxicam 7.5 daily  Zanaflex '2mg'$  at night as needed See me again 4 weeks

## 2021-04-05 ENCOUNTER — Other Ambulatory Visit (HOSPITAL_BASED_OUTPATIENT_CLINIC_OR_DEPARTMENT_OTHER): Payer: Self-pay

## 2021-04-05 MED ORDER — CARESTART COVID-19 HOME TEST VI KIT
PACK | 0 refills | Status: DC
  Filled 2021-04-05: qty 2, 4d supply, fill #0

## 2021-04-21 ENCOUNTER — Encounter: Payer: No Typology Code available for payment source | Admitting: Plastic Surgery

## 2021-04-21 ENCOUNTER — Ambulatory Visit (INDEPENDENT_AMBULATORY_CARE_PROVIDER_SITE_OTHER): Payer: Self-pay | Admitting: Plastic Surgery

## 2021-04-21 ENCOUNTER — Other Ambulatory Visit: Payer: Self-pay

## 2021-04-21 DIAGNOSIS — Z411 Encounter for cosmetic surgery: Secondary | ICD-10-CM

## 2021-04-21 NOTE — Progress Notes (Signed)
Patient presents to discuss Botox treatment.  In the past we have done 40 units of Botox.  We have also tried 120 units of Dysport previously.  This dosing seems to work for her.  We discussed the risk and benefits of Botox treatment and she is interested in moving forward.  The forehead, glabella and crows feet were prepped with an alcohol pad and 40 units of Botox were distributed.  She tolerated this well.  We will see her again at her next visit.

## 2021-04-29 ENCOUNTER — Encounter: Payer: No Typology Code available for payment source | Admitting: Plastic Surgery

## 2021-05-01 ENCOUNTER — Encounter: Payer: Self-pay | Admitting: Family Medicine

## 2021-05-02 ENCOUNTER — Other Ambulatory Visit: Payer: Self-pay

## 2021-05-02 MED ORDER — BIMATOPROST 0.03 % EX SOLN: CUTANEOUS | 12 refills | Status: DC

## 2021-05-10 ENCOUNTER — Encounter: Payer: Self-pay | Admitting: Family Medicine

## 2021-05-12 ENCOUNTER — Other Ambulatory Visit (HOSPITAL_BASED_OUTPATIENT_CLINIC_OR_DEPARTMENT_OTHER): Payer: Self-pay

## 2021-05-12 ENCOUNTER — Other Ambulatory Visit: Payer: Self-pay

## 2021-05-12 MED ORDER — VITAMIN D (ERGOCALCIFEROL) 1.25 MG (50000 UNIT) PO CAPS
50000.0000 [IU] | ORAL_CAPSULE | ORAL | 0 refills | Status: DC
  Filled 2021-05-12: qty 12, 84d supply, fill #0

## 2021-05-20 ENCOUNTER — Other Ambulatory Visit: Payer: Self-pay

## 2021-05-20 ENCOUNTER — Ambulatory Visit: Payer: No Typology Code available for payment source | Admitting: Sports Medicine

## 2021-05-20 ENCOUNTER — Ambulatory Visit: Payer: Self-pay

## 2021-05-20 VITALS — BP 110/70 | HR 72 | Ht 66.0 in | Wt 150.0 lb

## 2021-05-20 DIAGNOSIS — M25561 Pain in right knee: Secondary | ICD-10-CM

## 2021-05-20 NOTE — Patient Instructions (Addendum)
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

## 2021-05-20 NOTE — Progress Notes (Signed)
Benito Mccreedy D.[place] Phone: [phone]   Assessment and Plan:     1. Acute pain of right knee 2. Patellofemoral arthralgia of right knee -Acute, no improvement, supplement, initial sports medicine visit - Likely patellofemoral syndrome of right knee with history of right kneecap dislocation, HPI, physical exam.  Currently pain may have also been exacerbated by compensatory mechanics from recent right hip pain - Restart meloxicam 7.5 mg daily x10 days and then may use as needed.  Patient has leftover medication from previous prescription -Start patellofemoral exercises HEP.  Handout provided - Due to acute nature, no MOI, lack of red flag symptoms, no imaging obtained today  Pertinent previous records reviewed include previous sports med notes   Follow Up: In 4 weeks if no improvement or worsening symptoms.  Would consider x-ray +/- ultrasound +/- formal PT at that time   Subjective:   I, Judy Pimple, am serving as a scribe for Dr. Glennon Mac  Chief Complaint: Right knee pain   HPI:   05/20/21 Patient is a 45 year old female presenting with right knee pain and instability. Patient is ine when she is walking but if she goes to get up or going up steps right foot dominant will cause pain. Patient states there feels like there is sand in her knee and a grinding feeling. Patient states that this has been going on about 4 weeks, Tuesday night she woke up from an aching pain. Patient states that she can run sometimes and other times she feels like she is going to fly off the treadmill. Patient has also experience weakness in that knee. Denies swelling, sometimes a popping clicking sound, and locates pain to under the knee cap and stays in that locations. Patient states that she has tried tizanidine and meloxicam which helped. Other than that she has take ibuprofen as needed.      Relevant Historical  Information: Recent right hip pain  Additional pertinent review of systems negative.   Current Outpatient Medications:    bimatoprost (LATISSE) 0.03 % ophthalmic solution, Place one drop on applicator and apply evenly along the skin of the upper eyelid at base of eyelashes once daily at bedtime; repeat procedure for second eye (use a clean applicator). Patient may choose 3 ml size per preference, Disp: 5 mL, Rfl: 12   clobetasol ointment (TEMOVATE) 0.05 %, clobetasol 0.05 % topical ointment, Disp: , Rfl:    COVID-19 At Home Antigen Test (CARESTART COVID-19 HOME TEST) KIT, use as directed, Disp: 2 kit, Rfl: 0   COVID-19 mRNA vaccine, Pfizer, 30 MCG/0.3ML injection, INJECT AS DIRECTED, Disp: .3 mL, Rfl: 0   fluticasone (FLONASE) 50 MCG/ACT nasal spray, PLACE 2 SPRAYS INTO BOTH NOSTRILS AS NEEDED., Disp: 16 g, Rfl: 11   imiquimod (ALDARA) 5 % cream, Apply topically at bedtime., Disp: , Rfl:    imiquimod (ALDARA) 5 % cream, APPLY 1 APPLICATION ON TO THE BACK NIGHTLY MONDAY - FRIDAYS FOR 4 WEEKS, Disp: 24 each, Rfl: 0   levonorgestrel (MIRENA, 52 MG,) 20 MCG/24HR IUD, Mirena 20 mcg/24 hours (7 yrs) 52 mg intrauterine device, Disp: , Rfl:    meloxicam (MOBIC) 7.5 MG tablet, Take 1 tablet (7.5 mg total) by mouth daily., Disp: 30 tablet, Rfl: 0   tiZANidine (ZANAFLEX) 2 MG tablet, Take 1 tablet (2 mg total) by mouth at bedtime., Disp: 30 tablet, Rfl: 0   triamterene-hydrochlorothiazide (MAXZIDE-25) 37.5-25 MG tablet, Take 0.5-1 tablets  by mouth daily as needed (edema)., Disp: 30 tablet, Rfl: 3   triamterene-hydrochlorothiazide (MAXZIDE-25) 37.5-25 MG tablet, Take 1/2 - 1 tablet by mouth daily as needed for edema, Disp: 30 tablet, Rfl: 3   Vitamin D, Ergocalciferol, (DRISDOL) 1.25 MG (50000 UNIT) CAPS capsule, Take 1 capsule (50,000 Units total) by mouth every 7 (seven) days., Disp: 12 capsule, Rfl: 0   Objective:     Vitals:   05/20/21 0819  BP: 110/70  Pulse: 72  SpO2: 98%  Weight: 150 lb (68 kg)   Height: _0  (1.676 m)      Body mass index is 24.21 kg/m.    Physical Exam:    General:  awake, alert oriented, no acute distress nontoxic Skin: no suspicious lesions or rashes Neuro:sensation intact, no deficits, strength 5/5 with no deficits, no atrophy, normal muscle tone Psych: No signs of anxiety, depression or other mood disorder  Right knee: No swelling No deformity Neg fluid wave, joint milking ROM Flex 110, Ext 0 NTTP over the quad tendon, medial fem condyle, lat fem condyle, patella, plica, patella tendon, tibial tuberostiy, fibular head, posterior fossa, pes anserine bursa, gerdy's tubercle, medial jt line, lateral jt line Hypermobile kneecap with passive medial and lateral pressure Positive grind test Neg anterior and posterior drawer Neg lachman Neg sag sign Negative varus stress Negative valgus stress Negative Thessaly  Gait normal    Electronically signed by:  Benito Mccreedy D.[facility] 8:40 AM 05/20/21

## 2021-05-23 ENCOUNTER — Other Ambulatory Visit (HOSPITAL_BASED_OUTPATIENT_CLINIC_OR_DEPARTMENT_OTHER): Payer: Self-pay

## 2021-05-23 MED FILL — Fluticasone Propionate Nasal Susp 50 MCG/ACT: NASAL | 30 days supply | Qty: 16 | Fill #2 | Status: AC

## 2021-05-24 ENCOUNTER — Other Ambulatory Visit (HOSPITAL_BASED_OUTPATIENT_CLINIC_OR_DEPARTMENT_OTHER): Payer: Self-pay | Admitting: Family Medicine

## 2021-05-24 DIAGNOSIS — Z1231 Encounter for screening mammogram for malignant neoplasm of breast: Secondary | ICD-10-CM

## 2021-05-24 NOTE — Progress Notes (Incomplete)
Phone [phone] In person visit   Subjective:   CHARLOTTE FIDALGO is a 45 y.o. year old very pleasant female patient who presents for/with See problem oriented charting No chief complaint on file.   This visit occurred during the SARS-CoV-2 public health emergency.  Safety protocols were in place, including screening questions prior to the visit, additional usage of staff PPE, and extensive cleaning of exam room while observing appropriate contact time as indicated for disinfecting solutions.   Past Medical History-  Patient Active Problem List   Diagnosis Date Noted   Venous insufficiency 06/11/2019   Right knee pain 03/07/2019   Encounter for counseling 11/22/2018   Gluteal tendonitis of left buttock 11/05/2018   Somatic dysfunction of left sacroiliac joint 10/24/2018   Tendonitis 05/31/2018   Olecranon bursitis of right elbow 05/31/2018   Weakness of right hip 03/28/2018   Nonallopathic lesion of thoracic region 03/28/2018   Nonallopathic lesion of lumbosacral region 03/28/2018   Nonallopathic lesion of sacral region 03/28/2018   Seborrheic dermatitis 08/28/2017   Actinic keratosis 06/08/2015   Acromioclavicular sprain 04/29/2015   History of cervical cancer 08/19/2014   Vitamin D deficiency 08/19/2014   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 Patient may choose 3 ml size per preference 5 mL 12   clobetasol ointment (TEMOVATE) 0.05 % clobetasol 0.05 % topical ointment     COVID-19 At Home Antigen Test (CARESTART COVID-19 HOME TEST) KIT use as directed 2 kit 0   fluticasone (FLONASE) 50 MCG/ACT nasal spray PLACE  2 SPRAYS INTO BOTH NOSTRILS AS NEEDED. 16 g 11   imiquimod (ALDARA) 5 % cream Apply topically at bedtime.     imiquimod (ALDARA) 5 % cream APPLY 1 APPLICATION ON TO THE BACK NIGHTLY MONDAY - FRIDAYS FOR 4 WEEKS 24 each 0   levonorgestrel (MIRENA, 52 MG,) 20 MCG/24HR IUD Mirena 20 mcg/24 hours (7 yrs) 52 mg intrauterine device     meloxicam (MOBIC) 7.5 MG tablet Take 1 tablet (7.5 mg total) by mouth daily. 30 tablet 0   tiZANidine (ZANAFLEX) 2 MG tablet Take 1 tablet (2 mg total) by mouth at bedtime. 30 tablet 0   triamterene-hydrochlorothiazide (MAXZIDE-25) 37.5-25 MG tablet Take 0.5-1 tablets by mouth daily as needed (edema). 30 tablet 3   triamterene-hydrochlorothiazide (MAXZIDE-25) 37.5-25 MG tablet Take 1/2 - 1 tablet by mouth daily as needed for edema 30 tablet 3   Vitamin D, Ergocalciferol, (DRISDOL) 1.25 MG (50000 UNIT) CAPS capsule Take 1 capsule (50,000 Units total) by mouth every 7 (seven) days. 12 capsule 0   No current facility-administered medications for this visit.     Objective:  There were no vitals taken for this visit. Gen: NAD, resting comfortably CV: RRR no murmurs rubs or gallops Lungs: CTAB no crackles, wheeze, rhonchi Abdomen: soft/nontender/nondistended/normal bowel sounds. No rebound or guarding.  Ext: no edema Skin: warm, dry Neuro: grossly normal, moves all extremities  ***    Assessment and Plan  #Vitamin D deficiency S: Medication: *** Last vitamin D Lab Results  Component Value Date   VD25OH 87.21 08/27/2020   A/P: ***   #Screening hyperlipidemia- lipids looked excellent in 2017-now at  5-year interval we will rescreen Lab Results  Component Value Date   CHOL 160 08/27/2020   HDL 105.70 08/27/2020   LDLCALC 44 08/27/2020   TRIG 51.0 08/27/2020   CHOLHDL 2 08/27/2020     Recommended follow up: No follow-ups on file. Future Appointments  Date Time Provider [place]  05/26/2021  8:40 AM Marin Olp, MD [facility]   06/22/2021  9:15 AM Cindra Presume, MD [facility] None  07/05/2021  1:00 PM [facility]  08/31/2021  9:20 AM Marin Olp, MD [facility]  09/01/2021  9:15 AM Claudia Desanctis Steffanie Dunn, MD [facility] None    Lab/Order associations: No diagnosis found.  No orders of the defined types were placed in this encounter.   Return precautions advised.  Pamella Pert

## 2021-05-26 ENCOUNTER — Ambulatory Visit: Payer: No Typology Code available for payment source | Admitting: Family Medicine

## 2021-06-03 ENCOUNTER — Ambulatory Visit: Payer: No Typology Code available for payment source | Admitting: Family Medicine

## 2021-06-06 NOTE — Progress Notes (Signed)
Phone [phone] In person visit   Subjective:   Megan Mcmillan is a 45 y.o. year old very pleasant female patient who presents for/with See problem oriented charting Chief Complaint  Patient presents with   Other    Needs spot cryo'd on back and leg and would like to see if you can Rx a cream for this.    This visit occurred during the SARS-CoV-2 public health emergency.  Safety protocols were in place, including screening questions prior to the visit, additional usage of staff PPE, and extensive cleaning of exam room while observing appropriate contact time as indicated for disinfecting solutions.   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    Priority: Medium    Seborrheic dermatitis 08/28/2017    Priority: Low   Actinic keratosis 06/08/2015    Priority: Low   Acromioclavicular sprain 04/29/2015    Priority: Low   History of cervical cancer 08/19/2014    Priority: Low   Vitamin D deficiency 08/19/2014    Priority: Low   Former smoker 08/19/2014    Priority: Low   Right knee pain 03/07/2019   Encounter for counseling 11/22/2018   Gluteal tendonitis of left buttock 11/05/2018   Somatic dysfunction of left sacroiliac joint 10/24/2018   Tendonitis 05/31/2018   Olecranon bursitis of right elbow 05/31/2018   Weakness of right hip 03/28/2018   Nonallopathic lesion of thoracic region 03/28/2018   Nonallopathic lesion of lumbosacral region 03/28/2018   Nonallopathic lesion of sacral region 03/28/2018    Medications- reviewed and updated Current Outpatient Medications  Medication Sig Dispense Refill   bimatoprost (LATISSE) 0.03 % ophthalmic solution Place one drop on applicator and apply evenly along the skin of the upper eyelid at base of eyelashes once daily at bedtime; repeat procedure for second eye (use a clean applicator). Patient  may choose 3 ml size per preference 5 mL 12   clobetasol ointment (TEMOVATE) 0.05 % clobetasol 0.05 % topical ointment     fluoruracil (CARAC) 0.5 % cream Apply topically daily. Apply thin film to lesions once daily for up to 4 weeks 30 g 0   fluticasone (FLONASE) 50 MCG/ACT nasal spray PLACE 2 SPRAYS INTO BOTH NOSTRILS AS NEEDED. 16 g 11   imiquimod (ALDARA) 5 % cream Apply topically at bedtime.     imiquimod (ALDARA) 5 % cream APPLY 1 APPLICATION ON TO THE BACK NIGHTLY MONDAY - FRIDAYS FOR 4 WEEKS 24 each 0   levonorgestrel (MIRENA, 52 MG,) 20 MCG/24HR IUD Mirena 20 mcg/24 hours (7 yrs) 52 mg intrauterine device     meloxicam (MOBIC) 7.5 MG tablet Take 1 tablet (7.5 mg total) by mouth daily. 30 tablet 0   tiZANidine (ZANAFLEX) 2 MG tablet Take 1 tablet (2 mg total) by mouth at bedtime. 30 tablet 0   triamterene-hydrochlorothiazide (MAXZIDE-25) 37.5-25 MG tablet Take 0.5-1 tablets by mouth daily as needed (edema). 30 tablet 3   triamterene-hydrochlorothiazide (MAXZIDE-25) 37.5-25 MG tablet Take 1/2 - 1 tablet by mouth daily as needed for edema 30 tablet 3   Vitamin D, Ergocalciferol, (DRISDOL) 1.25 MG (50000 UNIT) CAPS capsule Take 1 capsule (50,000 Units total) by mouth every 7 (seven) days. 12 capsule 0   No current facility-administered medications for this visit.     Objective:  BP 122/78   Pulse 82   Temp 98.3 F (  36.8 C)   Ht 5\' 6"  (1.676 m)   Wt 147 lb 9.6 oz (67 kg)   SpO2 97%   BMI 23.82 kg/m  Gen: NAD, resting comfortably Skin: warm, dry Multiple erythematous patches with scaling on top concerning for actinic keratosis on right lower leg x4, left lower leg x4, left lower portion of thoracic spine  Procedure note: Benefits and risks verbally discussed with patient At least7 second freeze thaw cycle of cryotherapy performed  with liquid nitrogen to actinic keratosis on right lower leg x4, left lower leg x4, left lower portion of thoracic spine No complications.  Patient  tolerated the procedure well other than mild pain. Due to history of AK's patient is well versed in aftercare as well as potential concerns- she will schedule follow up if occurs     Assessment and Plan   # history skin cancer/AKs- concern for AKs S: sees dermatology but has a hard to get into visit.  Patient with history of multiple skin cancers as well as actinic keratosis.  Has noted several areas on her bilateral lower legs primarily as well as 1 spot on her back that have not been easy to treat with Aldara-is concerned about her animals coming in contact with these areas particular on the legs and the area on her back has been very sensitive when she is applied imiquimod.  Has not used imiquimod liquid due to concern about dogs being around her- did hurt her one time when used on the back.   A/P: I agree with patient multiple concerning lesions for actinic keratosis-treated 4 on right lower leg, 4 on left lower leg and 1 on the back.   The imiquimod is somewhat runny and is concerned about her dogs-wants to try cream that may stay in place more and wants to have ability to use on the face-we will try fluorouracil 0.5% cream-I will also patient have not prescribed this medication before but she is well aware of potential benefits versus risks (we discussed some of these todaY) based on prior treatments and I asked her to run it by her dermatologist the next time she sees him to make sure appropriate  She also has a spot on her wrist near her watch and one on left forehead she plans to treat at home   #HM- plans to see GYN in next few months to discuss if needs pap- in 2019 transformation zone not present  Recommended follow up: keep January CPE Future Appointments  Date Time Provider [place]  06/22/2021  9:15 AM Cindra Presume, MD [facility] None  07/05/2021  1:00 PM [facility]  08/31/2021  9:20 AM Marin Olp, MD [facility]  09/01/2021  9:15 AM P[place],  Steffanie Dunn, MD [facility] None    Lab/Order associations:   ICD-10-CM   1. Actinic keratoses  L57.0       Meds ordered this encounter  Medications   fluoruracil (CARAC) 0.5 % cream    Sig: Apply topically daily. Apply thin film to lesions once daily for up to 4 weeks    Dispense:  30 g    Refill:  0    Return precautions advised.  Garret Reddish, MD

## 2021-06-13 ENCOUNTER — Other Ambulatory Visit: Payer: Self-pay

## 2021-06-13 ENCOUNTER — Ambulatory Visit (INDEPENDENT_AMBULATORY_CARE_PROVIDER_SITE_OTHER): Payer: No Typology Code available for payment source | Admitting: Family Medicine

## 2021-06-13 ENCOUNTER — Encounter: Payer: Self-pay | Admitting: Family Medicine

## 2021-06-13 ENCOUNTER — Other Ambulatory Visit (HOSPITAL_BASED_OUTPATIENT_CLINIC_OR_DEPARTMENT_OTHER): Payer: Self-pay

## 2021-06-13 VITALS — BP 122/78 | HR 82 | Temp 98.3°F | Ht 66.0 in | Wt 147.6 lb

## 2021-06-13 DIAGNOSIS — L570 Actinic keratosis: Secondary | ICD-10-CM

## 2021-06-13 DIAGNOSIS — E559 Vitamin D deficiency, unspecified: Secondary | ICD-10-CM

## 2021-06-13 MED ORDER — FLUOROURACIL 0.5 % EX CREA
TOPICAL_CREAM | Freq: Every day | CUTANEOUS | 0 refills | Status: DC
  Filled 2021-06-13: qty 30, 30d supply, fill #0

## 2021-06-13 NOTE — Patient Instructions (Addendum)
Health Maintenance Due  Topic Date Due   COVID-19 Vaccine (4 - Booster)-  Please consider bivalent booster shot at your local pharmacy and send [place] the date when you received so we can put it in our system. 07/16/2020   Please trial Fluorouracil (Carac) for your sun spots. Please update me for any new, worsening or persistent symptoms.   Patient well aware of what to look for after cryotherapy- she will let us know if any concerns  Recommended follow up: see you in January

## 2021-06-15 ENCOUNTER — Other Ambulatory Visit (HOSPITAL_BASED_OUTPATIENT_CLINIC_OR_DEPARTMENT_OTHER): Payer: Self-pay

## 2021-06-16 ENCOUNTER — Other Ambulatory Visit (HOSPITAL_BASED_OUTPATIENT_CLINIC_OR_DEPARTMENT_OTHER): Payer: Self-pay

## 2021-06-16 ENCOUNTER — Encounter: Payer: Self-pay | Admitting: Family Medicine

## 2021-06-16 MED ORDER — FLUOROURACIL 5 % EX CREA
TOPICAL_CREAM | CUTANEOUS | 0 refills | Status: DC
  Filled 2021-06-16: qty 40, 30d supply, fill #0

## 2021-06-22 ENCOUNTER — Ambulatory Visit (INDEPENDENT_AMBULATORY_CARE_PROVIDER_SITE_OTHER): Payer: No Typology Code available for payment source | Admitting: Plastic Surgery

## 2021-06-22 ENCOUNTER — Other Ambulatory Visit: Payer: Self-pay

## 2021-06-22 ENCOUNTER — Encounter: Payer: Self-pay | Admitting: Plastic Surgery

## 2021-06-22 ENCOUNTER — Telehealth (HOSPITAL_BASED_OUTPATIENT_CLINIC_OR_DEPARTMENT_OTHER): Payer: Self-pay

## 2021-06-22 DIAGNOSIS — Z411 Encounter for cosmetic surgery: Secondary | ICD-10-CM

## 2021-06-22 NOTE — Progress Notes (Signed)
Patient presents to discuss Botox treatment.  She has had 40 units of Botox and 120 units of Dysport in the past and seems to be comfortable with that dosing.  She would like to be consistent with that today.  Reviewed the risks and benefits of Botox treatment and she is interested in moving forward.  40 units of Botox were distributed at the forehead, glabella and crows feet in a standard injection pattern.  She tolerated this fine.  We will plan to see her for any touchups and again on her next visit.  All of her questions were answered.

## 2021-06-27 ENCOUNTER — Ambulatory Visit: Payer: No Typology Code available for payment source | Admitting: Family Medicine

## 2021-06-28 ENCOUNTER — Encounter: Payer: Self-pay | Admitting: Family Medicine

## 2021-06-29 ENCOUNTER — Other Ambulatory Visit: Payer: Self-pay | Admitting: Family Medicine

## 2021-06-29 ENCOUNTER — Other Ambulatory Visit (HOSPITAL_BASED_OUTPATIENT_CLINIC_OR_DEPARTMENT_OTHER): Payer: Self-pay

## 2021-06-29 MED ORDER — MELOXICAM 7.5 MG PO TABS
7.5000 mg | ORAL_TABLET | Freq: Every day | ORAL | 0 refills | Status: DC
  Filled 2021-06-29: qty 30, 30d supply, fill #0

## 2021-06-29 MED ORDER — TIZANIDINE HCL 2 MG PO TABS
2.0000 mg | ORAL_TABLET | Freq: Every day | ORAL | 0 refills | Status: DC
  Filled 2021-06-29: qty 30, 30d supply, fill #0

## 2021-06-29 NOTE — Telephone Encounter (Signed)
Left message for patient to call back to schedule with Dr. Glennon Mac or Georgina Snell. Previously has seen Dr. Glennon Mac.

## 2021-07-05 ENCOUNTER — Inpatient Hospital Stay (HOSPITAL_BASED_OUTPATIENT_CLINIC_OR_DEPARTMENT_OTHER): Admission: RE | Admit: 2021-07-05 | Payer: No Typology Code available for payment source | Source: Ambulatory Visit

## 2021-07-05 DIAGNOSIS — Z1231 Encounter for screening mammogram for malignant neoplasm of breast: Secondary | ICD-10-CM

## 2021-07-13 ENCOUNTER — Other Ambulatory Visit: Payer: Self-pay | Admitting: Obstetrics and Gynecology

## 2021-07-13 DIAGNOSIS — N6002 Solitary cyst of left breast: Secondary | ICD-10-CM

## 2021-08-15 ENCOUNTER — Ambulatory Visit
Admission: RE | Admit: 2021-08-15 | Discharge: 2021-08-15 | Disposition: A | Discharge status: Home / Self Care | Payer: No Typology Code available for payment source | Source: Ambulatory Visit | Attending: Obstetrics and Gynecology | Admitting: Obstetrics and Gynecology

## 2021-08-15 DIAGNOSIS — N6002 Solitary cyst of left breast: Secondary | ICD-10-CM

## 2021-08-19 ENCOUNTER — Ambulatory Visit
Admission: RE | Admit: 2021-08-19 | Discharge: 2021-08-19 | Disposition: A | Discharge status: Home / Self Care | Payer: No Typology Code available for payment source | Source: Ambulatory Visit | Attending: Obstetrics and Gynecology | Admitting: Obstetrics and Gynecology

## 2021-08-24 NOTE — Progress Notes (Signed)
Phone [phone]   Subjective:  Patient presents today for their annual physical. Chief complaint-noted.   See problem oriented charting- Review of Systems  Constitutional:  Positive for fever (other than with booster). Negative for chills.  HENT:  Positive for congestion and sinus pain.   Eyes:  Negative for blurred vision and double vision.  Respiratory:  Negative for cough and shortness of breath.   Cardiovascular:  Negative for chest pain and palpitations.  Gastrointestinal:  Negative for constipation, diarrhea, heartburn and nausea.  Genitourinary:  Negative for dysuria and frequency.  Musculoskeletal:  Negative for back pain, myalgias and neck pain.  Skin:  Negative for itching and rash.  Neurological:  Negative for dizziness and headaches.  Endo/Heme/Allergies:  Negative for polydipsia. Bruises/bleeds easily (baseline).  Psychiatric/Behavioral:  Negative for depression and suicidal ideas.    The following were reviewed and entered/updated in epic: Past Medical History:  Diagnosis Date   Migraine 08/19/2014   No recurrence after birth of son. Previously on flexeril.     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    Priority: Medium    Seborrheic dermatitis 08/28/2017    Priority: Low   Actinic keratosis 06/08/2015    Priority: Low   Acromioclavicular sprain 04/29/2015    Priority: Low   History of cervical cancer 08/19/2014    Priority: Low   Vitamin D deficiency 08/19/2014    Priority: Low   Former smoker 08/19/2014    Priority: Low   Right knee pain 03/07/2019   Encounter for counseling 11/22/2018   Gluteal tendonitis of left buttock 11/05/2018   Somatic dysfunction of left sacroiliac joint 10/24/2018   Tendonitis 05/31/2018   Olecranon bursitis of right elbow 05/31/2018   Weakness of right hip 03/28/2018   Nonallopathic lesion of thoracic  region 03/28/2018   Nonallopathic lesion of lumbosacral region 03/28/2018   Nonallopathic lesion of sacral region 03/28/2018   Past Surgical History:  Procedure Laterality Date   CERVICAL CONIZATION W/BX  1997   cold knife, no abnormal paps since.    VAGINAL DELIVERY  1998    Family History  Problem Relation Age of Onset   Hypertension Mother    Depression Mother        "depression in entire family"   Heart attack Father 31       smoked marijuana for many years   Hypertension Father    Depression Father    Breast cancer Maternal Grandmother        60s   Diabetes Maternal Grandmother    Dementia Maternal Grandmother    Colon cancer Neg Hx    Colon polyps Neg Hx    Esophageal cancer Neg Hx    Stomach cancer Neg Hx    Rectal cancer Neg Hx     Medications- reviewed and updated Current Outpatient Medications  Medication Sig Dispense Refill   bimatoprost (LATISSE) 0.03 % ophthalmic solution Place one drop on applicator and apply evenly along the skin of the upper eyelid at base of eyelashes once daily at bedtime; repeat procedure for second eye (use a clean applicator). Patient may choose 3 ml size per preference 5 mL 12   clobetasol ointment (TEMOVATE) 0.05 % clobetasol 0.05 % topical ointment     fluorouracil (EFUDEX) 5 % cream Apply to lesions twice daily for 2 to 4 weeks (apply until inflammatory response reaches erosion  stage, then stop); complete healing may not be evident for 1 to 2 months following treatment. 40 g 0   levonorgestrel (MIRENA, 52 MG,) 20 MCG/24HR IUD Mirena 20 mcg/24 hours (7 yrs) 52 mg intrauterine device     meloxicam (MOBIC) 7.5 MG tablet Take 1 tablet (7.5 mg total) by mouth daily. 30 tablet 0   tiZANidine (ZANAFLEX) 2 MG tablet Take 1 tablet (2 mg total) by mouth at bedtime. 30 tablet 0   triamterene-hydrochlorothiazide (MAXZIDE-25) 37.5-25 MG tablet Take 1/2 - 1 tablet by mouth daily as needed for edema 30 tablet 3   Vitamin D, Ergocalciferol, (DRISDOL)  1.25 MG (50000 UNIT) CAPS capsule Take 1 capsule (50,000 Units total) by mouth every 7 (seven) days. 12 capsule 0   fluticasone (FLONASE) 50 MCG/ACT nasal spray PLACE 2 SPRAYS INTO BOTH NOSTRILS AS NEEDED. 16 g 11   No current facility-administered medications for this visit.    Allergies-reviewed and updated Allergies  Allergen Reactions   Latex     swelling    Social History   Social History Narrative   Married 2015. Son 1610 [name]- also patient of Dr. Yong Channel.     Finished HS.       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 Oropharynx normal Neck: no thyromegaly CV: RRR no murmurs rubs or gallops Lungs: CTAB no crackles, wheeze, rhonchi Abdomen: soft/nontender/nondistended/normal bowel sounds. No rebound or guarding.  Ext: no edema Skin: warm, dry Neuro: grossly normal, moves all extremities, PERRLA   Assessment and Plan   46 y.o. Mcmillan presenting for annual physical.  Health Maintenance counseling: 1. Anticipatory guidance: Patient counseled regarding regular dental exams -q6 months, eye exams-low grade prescription and wearing regularly,  avoiding smoking and second hand smoke, limiting alcohol to 1 beverage per day-2 per day last year- discussed reducing-she reports about 2 a day still- encouraged again to cut. No illicit drugs   2. Risk factor reduction:  Advised patient of need for regular exercise and diet rich and fruits and vegetables to reduce risk of heart attack and stroke.  Exercise-still  enjoying orange theory (prior shred) 3 days a week Diet/Diet Management-reasonably healthy.  Weight largely stable from last physical 2022 .  Wt Readings from Last 3 Encounters:  01/25/Megan 146 lb (66.2 kg)  06/13/21 147 lb 9.6 oz (67 kg)  05/20/21 150 lb (68 kg)  3. Immunizations/screenings/ancillary studies-up-to-date  Immunization History  Administered Date(s) Administered   Influenza,inj,Quad PF,6+ Mos 05/17/2019, 05/12/2020   Influenza-Unspecified 05/21/2014, 05/21/2015, 05/03/2016, 04/30/2017, 05/13/2021   PFIZER(Purple Top)SARS-COV-2 Vaccination 10/03/2019, 10/17/2019, 05/21/2020   Pfizer Covid-19 Vaccine Bivalent Booster 18yrs & up 08/25/2021   Td 08/08/2013  4. Cervical cancer screening- follows with Dr. [facility]- pap was negative/normal and HPV high risk negative on 08/23/2017- transition zone was absent though- I think its reasonable at next check to ask if repeat indicated with transition zone absent- she plans  to call to schedule 5. Breast cancer screening-  breast exam with GYN  and mammogram -recently had ultrasound of left breast with plan for repeat in 1 year mammogram- she may do sooner 6. Colon cancer screening -December 01, 2020 with 10-year follow-up planned  7. Skin cancer screening-[place] [facility] Dr. Ronnald Ramp.  Multiple skin cancers in the past- sees in March usually- we did some cryotherapy at last visit and we used efudex short term- did well. advised regular sunscreen use. Denies worrisome, changing, or new skin lesions- on spot she will have Dr. Ronnald Ramp look at on legs 8. Birth control/STD check-IUD for birth control.  Monogamous so no STD screening not needed  9. Osteoporosis screening at 26- discussed we will plan on this at age 24  -Former smoker-quit in 2012-not old enough for lung cancer screening startts at 72- 20 pack years so would qualify 2027- will monitor age  Status of chronic or acute concerns   #social update-  10 day cruise coming  up!    #Vitamin D deficiency S: Medication: Vit D 1.25Mg  50,000 units every 7 daysMost recently prescribed by Dr. Tamala Julian. 1 more pill left Last vitamin D Lab Results  Component Value Date   VD25OH 87.21 08/27/2020  A/P: Given last year numbers were pretty elevated 1 make sure not too high-update vitamin D with labs today   #Screening hyperlipidemia- lipids looked excellent in 2012-at 5-year interval we will re-screen Lab Results  Component Value Date   CHOL 160 08/27/2020   HDL 105.70 08/27/2020   LDLCALC 44 08/27/2020   TRIG 51.0 08/27/2020   CHOLHDL 2 08/27/2020   #Venous insufficiency- patient with ongoing issues with leg swelling- worsened in summer-has Maxzide on hand mainly for travel   #Musculoskeletal concerns-follows up with Dr. Tamala Julian as needed    #Allergies-reasonable control on Flonase  And claritin at moment- also sudafed at times  # MSK concerns- was having hip flexor issues then overcompensated and was having right knee pain including popping sensation- took meloxicam and improved. Power walked instead of running for 2 weeks.   Recommended follow up: Return in about 1 year (around 08/31/2022) for physical or sooner if needed. Future Appointments  Date Time Provider [place]  09/01/2021  9:45 AM Cindra Presume, MD [facility] None   Lab/Order associations: fasting other than creamer with coffee   ICD-10-CM   1. Preventative health care  Z00.00 Vitamin B12    CBC with Differential/Platelet    Comprehensive metabolic panel    VITAMIN D 25 Hydroxy (Vit-D Deficiency, Fractures)    2. Vitamin D deficiency  E55.9 VITAMIN D 25 Hydroxy (Vit-D Deficiency, Fractures)    3. History of squamous cell carcinoma  Z85.89     4. Venous insufficiency  I87.2     5. High risk medication use  Z79.899 CBC with Differential/Platelet    Comprehensive metabolic panel    6. B12 deficiency  E53.8 Vitamin B12     No orders of the defined types were placed in this  encounter.  I,Jada Bradford,acting as a scribe for Garret Reddish, MD.,have documented all relevant documentation on the behalf of Garret Reddish, MD,as directed by  Garret Reddish, MD while in the presence of Garret Reddish, MD.  I, Garret Reddish, MD, have reviewed all documentation for this visit. The documentation on 01/25/Megan for the exam, diagnosis, procedures, and orders are all accurate and complete.  Return precautions advised.  Garret Reddish, MD

## 2021-08-25 ENCOUNTER — Ambulatory Visit: Payer: No Typology Code available for payment source | Attending: Internal Medicine

## 2021-08-25 DIAGNOSIS — Z23 Encounter for immunization: Secondary | ICD-10-CM

## 2021-08-25 NOTE — Progress Notes (Signed)
° °  Covid-19 Vaccination Clinic  Name:  Megan Mcmillan    MRN: [id number] DOB: September 07, 1975  08/25/2021  [name] was observed post Covid-19 immunization for 15 minutes without incident. She was provided with Vaccine Information Sheet and instruction to access the V-Safe system.   [name] was instructed to call 911 with any severe reactions post vaccine: Difficulty breathing  Swelling of face and throat  A fast heartbeat  A bad rash all over body  Dizziness and weakness   Immunizations Administered     Name Date Dose VIS Date Route   Pfizer Covid-19 Vaccine Bivalent Booster 08/25/2021 11:29 AM 0.3 mL 04/06/2021 Intramuscular   Manufacturer: [place]   Lot: [id number]   [place]: [phone]

## 2021-08-26 ENCOUNTER — Other Ambulatory Visit (HOSPITAL_BASED_OUTPATIENT_CLINIC_OR_DEPARTMENT_OTHER): Payer: Self-pay

## 2021-08-26 MED ORDER — PFIZER COVID-19 VAC BIVALENT 30 MCG/0.3ML IM SUSP
INTRAMUSCULAR | 0 refills | Status: DC
  Filled 2021-08-26: qty 0.3, 1d supply, fill #0

## 2021-08-31 ENCOUNTER — Other Ambulatory Visit: Payer: Self-pay

## 2021-08-31 ENCOUNTER — Ambulatory Visit (INDEPENDENT_AMBULATORY_CARE_PROVIDER_SITE_OTHER): Payer: No Typology Code available for payment source | Admitting: Family Medicine

## 2021-08-31 ENCOUNTER — Encounter: Payer: Self-pay | Admitting: Family Medicine

## 2021-08-31 VITALS — BP 110/80 | HR 69 | Temp 97.9°F | Ht 66.0 in | Wt 146.0 lb

## 2021-08-31 DIAGNOSIS — Z8589 Personal history of malignant neoplasm of other organs and systems: Secondary | ICD-10-CM

## 2021-08-31 DIAGNOSIS — E538 Deficiency of other specified B group vitamins: Secondary | ICD-10-CM | POA: Diagnosis not present

## 2021-08-31 DIAGNOSIS — Z79899 Other long term (current) drug therapy: Secondary | ICD-10-CM | POA: Diagnosis not present

## 2021-08-31 DIAGNOSIS — Z87891 Personal history of nicotine dependence: Secondary | ICD-10-CM | POA: Diagnosis not present

## 2021-08-31 DIAGNOSIS — I872 Venous insufficiency (chronic) (peripheral): Secondary | ICD-10-CM | POA: Diagnosis not present

## 2021-08-31 DIAGNOSIS — R7989 Other specified abnormal findings of blood chemistry: Secondary | ICD-10-CM

## 2021-08-31 DIAGNOSIS — Z0000 Encounter for general adult medical examination without abnormal findings: Secondary | ICD-10-CM

## 2021-08-31 DIAGNOSIS — E559 Vitamin D deficiency, unspecified: Secondary | ICD-10-CM | POA: Diagnosis not present

## 2021-08-31 LAB — POC URINALSYSI DIPSTICK (AUTOMATED)
Bilirubin, UA: NEGATIVE
Blood, UA: NEGATIVE
Glucose, UA: NEGATIVE
Ketones, UA: NEGATIVE
Leukocytes, UA: NEGATIVE
Nitrite, UA: NEGATIVE
Protein, UA: NEGATIVE
Spec Grav, UA: 1.01 (ref 1.010–1.025)
Urobilinogen, UA: 0.2 E.U./dL
pH, UA: 6 (ref 5.0–8.0)

## 2021-08-31 LAB — COMPREHENSIVE METABOLIC PANEL
ALT: 66 U/L — ABNORMAL HIGH (ref 0–35)
AST: 44 U/L — ABNORMAL HIGH (ref 0–37)
Albumin: 4.3 g/dL (ref 3.5–5.2)
Alkaline Phosphatase: 57 U/L (ref 39–117)
BUN: 10 mg/dL (ref 6–23)
CO2: 31 mEq/L (ref 19–32)
Calcium: 9.7 mg/dL (ref 8.4–10.5)
Chloride: 100 mEq/L (ref 96–112)
Creatinine, Ser: 0.73 mg/dL (ref 0.40–1.20)
GFR: 98.2 mL/min (ref >60.00)
Glucose, Bld: 86 mg/dL (ref 70–99)
Potassium: 4.4 mEq/L (ref 3.5–5.1)
Sodium: 138 mEq/L (ref 135–145)
Total Bilirubin: 0.6 mg/dL (ref 0.2–1.2)
Total Protein: 7.2 g/dL (ref 6.0–8.3)

## 2021-08-31 LAB — CBC WITH DIFFERENTIAL/PLATELET
Basophils Absolute: 0 10*3/uL (ref 0.0–0.1)
Basophils Relative: 0.4 % (ref 0.0–3.0)
Eosinophils Absolute: 0 10*3/uL (ref 0.0–0.7)
Eosinophils Relative: 0.5 % (ref 0.0–5.0)
HCT: 42.5 % (ref 36.0–46.0)
Hemoglobin: 14 g/dL (ref 12.0–15.0)
Lymphocytes Relative: 24.8 % (ref 12.0–46.0)
Lymphs Abs: 1.4 10*3/uL (ref 0.7–4.0)
MCHC: 32.9 g/dL (ref 30.0–36.0)
MCV: 101.7 fl — ABNORMAL HIGH (ref 78.0–100.0)
Monocytes Absolute: 0.4 10*3/uL (ref 0.1–1.0)
Monocytes Relative: 7.3 % (ref 3.0–12.0)
Neutro Abs: 3.9 10*3/uL (ref 1.4–7.7)
Neutrophils Relative %: 67 % (ref 43.0–77.0)
Platelets: 233 10*3/uL (ref 150.0–400.0)
RBC: 4.18 Mil/uL (ref 3.87–5.11)
RDW: 13.5 % (ref 11.5–15.5)
WBC: 5.8 10*3/uL (ref 4.0–10.5)

## 2021-08-31 LAB — VITAMIN D 25 HYDROXY (VIT D DEFICIENCY, FRACTURES): VITD: 47.76 ng/mL (ref 30.00–100.00)

## 2021-08-31 LAB — VITAMIN B12: Vitamin B-12: 348 pg/mL (ref 211–911)

## 2021-08-31 NOTE — Addendum Note (Signed)
Addended by: Marijean Heath R on: 08/31/2021 01:09 PM   Modules accepted: Orders

## 2021-08-31 NOTE — Patient Instructions (Addendum)
Please stop by lab before you go If you have mychart- we will send your results within 3 business days of [place] receiving them.  If you do not have mychart- we will call you about results within 5 business days of [place] receiving them.  *please also note that you will see labs on mychart as soon as they post. I will later go in and write notes on them- will say "notes from Dr. Yong Channel"   No changes today! You are doing great  Recommended follow up: Return in about 1 year (around 08/31/2022) for physical or sooner if needed.

## 2021-09-01 ENCOUNTER — Ambulatory Visit (INDEPENDENT_AMBULATORY_CARE_PROVIDER_SITE_OTHER): Payer: Self-pay | Admitting: Plastic Surgery

## 2021-09-01 DIAGNOSIS — Z411 Encounter for cosmetic surgery: Secondary | ICD-10-CM

## 2021-09-01 NOTE — Progress Notes (Signed)
Patient presents to discuss Botox treatment.  In the past she has had 40 units of Botox and 120 units of Dysport and seems to be happy with this dosage.  We reviewed the risks and benefits and she is interested in moving forward.  40 units of Botox were distributed in the forehead, glabella and crows feet.  3 injection points in the crows feet and 2 rows for the forehead.  She tolerated this fine.  Plan to see her for any touchups or otherwise at her next visit.  All of her questions were answered.

## 2021-10-10 ENCOUNTER — Encounter: Payer: Self-pay | Admitting: Family Medicine

## 2021-10-10 ENCOUNTER — Ambulatory Visit (INDEPENDENT_AMBULATORY_CARE_PROVIDER_SITE_OTHER): Payer: No Typology Code available for payment source | Admitting: Family Medicine

## 2021-10-10 VITALS — BP 132/78 | HR 79 | Temp 98.1°F | Ht 65.0 in | Wt 149.3 lb

## 2021-10-10 DIAGNOSIS — C44712 Basal cell carcinoma of skin of right lower limb, including hip: Secondary | ICD-10-CM | POA: Diagnosis not present

## 2021-10-10 DIAGNOSIS — D489 Neoplasm of uncertain behavior, unspecified: Secondary | ICD-10-CM

## 2021-10-10 NOTE — Patient Instructions (Signed)
Do not shower for the rest of the day. When you do wash it, use only soap and water. Do not vigorously scrub. Apply triple antibiotic ointment (like Neosporin) twice daily. Keep the area clean and dry.  ? ?Things to look out for: increasing pain not relieved by ibuprofen/acetaminophen, fevers, spreading redness, drainage of pus, or foul odor. ? ?OK to take Tylenol 1000 mg (2 extra strength tabs) or 975 mg (3 regular strength tabs) every 6 hours as needed. ? ?Give [place] 1 week to get the results of your biopsy back. ? ?Let us know if you need anything. ?

## 2021-10-10 NOTE — Progress Notes (Signed)
Chief Complaint  ?Patient presents with  ? Procedure  ? ? ?Megan Mcmillan is a 46 y.o. female here for a skin complaint. ? ?Duration: a few years ?Location: back of RLE ?Pruritic? No ?Painful? No ?Drainage? No ?Other associated symptoms: starting to raise up ?Therapies tried thus far: [place], 5 FU, Cryotherapy ? ?Past Medical History:  ?Diagnosis Date  ? Migraine 08/19/2014  ? No recurrence after birth of son. Previously on flexeril.    ? ? ?BP 132/78   Pulse 79   Temp 98.1 ?F (36.7 ?C) (Oral)   Ht '5\' 5"'$  (1.651 m)   Wt 149 lb 5 oz (67.7 kg)   SpO2 99%   BMI 24.85 kg/m?  ?Gen: awake, alert, appearing stated age ?Lungs: No accessory muscle use ?Skin: 0.6 x 0.3 cm raised lesion on post RLE. Slightly scaly, no drainage, erythema, TTP, fluctuance, excoriation ?Psych: Age appropriate judgment and insight ? ?Procedure note; shave biopsy ?Informed consent was obtained. ?Indication: comfort and diagnosis ?The area was cleaned with alcohol and injected with 0.5 mL of 1% lidocaine with epinephrine. ?A Dermablade was slightly bent and used to cut under the area of interest. ?The specimen was placed in a sterile specimen cup and sent to the lab. ?The area was then cauterized ensuring adequate hemostasis. ?The area was dressed with triple antibiotic ointment and a bandage. ?There were no complications noted. ?The patient tolerated the procedure well. ? ?Neoplasm of uncertain behavior - Plan: Surgical pathology( [facility]), PR SHAV SKIN LES 0.6-1.0 CM TRUNK,ARM,LEG ? ?Aftercare instructions verbalized and written down. Warning signs and symptoms verbalized and written down in AVS. Await biopsy results. She does follow w derm.  ?F/u prn. ?The patient voiced understanding and agreement to the plan. ? ?Shelda Pal, DO ?10/10/21 ?2:30 PM ? ?

## 2021-10-19 ENCOUNTER — Encounter: Payer: Self-pay | Admitting: Family Medicine

## 2021-10-19 ENCOUNTER — Ambulatory Visit (INDEPENDENT_AMBULATORY_CARE_PROVIDER_SITE_OTHER): Payer: No Typology Code available for payment source | Admitting: Family Medicine

## 2021-10-19 DIAGNOSIS — C44712 Basal cell carcinoma of skin of right lower limb, including hip: Secondary | ICD-10-CM

## 2021-10-19 NOTE — Progress Notes (Signed)
No chief complaint on file. ? ? ?Subjective: ?Patient is a 46 y.o. female here for f/u. ? ?Had a shave biopsy last week showing a nodular basal cell carcinoma.  Here for management for that.  It was healing well. ? ?Past Medical History:  ?Diagnosis Date  ? Migraine 08/19/2014  ? No recurrence after birth of son. Previously on flexeril.    ? ? ?Objective: ?General: Awake, appears stated age ?Skin: 0.6 x 0.4 excoriated lesion on the posterior right calf region without surrounding erythema, fluctuance, drainage, or streaking redness ?Lungs:  No accessory muscle use ?Psych: Age appropriate judgment and insight, normal affect and mood ? ?Procedure note: Electrodesiccation and curettage ?Informed consent obtained. ?The area of interest was cleaned with alcohol and anesthetized with 3 mL of 1% lidocaine with epinephrine. ?After adequate anesthesia was obtained, the area was electrodesiccated with a Hyfrecator and the charred tissue was scraped away with a curette. ?This process was repeated 2 more times for a total of 3 rounds. ?The cancerous tissue was easily noted by texture under the curette compared to the healthy surrounding tissue ?After the final round, the area was cauterized to ensure adequate hemostasis. ?Triple antibiotic ointment and a bandage were placed. ?There were no immediate complications noted. ?The patient tolerated the procedure well. ? ? ?Assessment and Plan: ?Basal cell carcinoma (BCC) of skin of right lower extremity including hip - Plan: PR DESTR MALIG TRUNK,EXTREM 0.6-1 CM ? ?After performing the procedure, I think the [facility] extended little further than the biopsy.  Aftercare instructions verbalized and written down.  Warned about the low likelihood of this returning.  She will let me know if anything seems to be coming back.  We will consider a wider excision versus referral to dermatology. ?The patient voiced understanding and agreement to the plan. ? ?Shelda Pal, DO ?10/19/21  ?2:46  PM ? ? ? ? ?

## 2021-10-19 NOTE — Patient Instructions (Signed)
If anything recurs, let me know.  ? ?Ice/cold pack over area for 10-15 min twice daily. ? ?OK to take Tylenol 1000 mg (2 extra strength tabs) or 975 mg (3 regular strength tabs) every 6 hours as needed. ? ?Ibuprofen 400-600 mg (2-3 over the counter strength tabs) every 6 hours as needed for pain. ? ?Do not shower for the rest of the day. When you do wash it, use only soap and water. Do not vigorously scrub. Apply triple antibiotic ointment (like Neosporin) twice daily. Keep the area clean and dry.  ? ?Things to look out for: increasing pain not relieved by ibuprofen/acetaminophen, fevers, spreading redness, drainage of pus, or foul odor. ? ?Let us know if you need anything. ?

## 2021-10-25 ENCOUNTER — Other Ambulatory Visit (HOSPITAL_BASED_OUTPATIENT_CLINIC_OR_DEPARTMENT_OTHER): Payer: Self-pay

## 2021-11-08 ENCOUNTER — Other Ambulatory Visit: Payer: Self-pay | Admitting: Family Medicine

## 2021-11-08 ENCOUNTER — Other Ambulatory Visit (HOSPITAL_BASED_OUTPATIENT_CLINIC_OR_DEPARTMENT_OTHER): Payer: Self-pay

## 2021-11-08 MED ORDER — FLUTICASONE PROPIONATE 50 MCG/ACT NA SUSP
NASAL | 11 refills | Status: DC
  Filled 2021-11-08: qty 16, 30d supply, fill #0
  Filled 2022-01-30: qty 16, 30d supply, fill #1
  Filled 2022-05-05: qty 16, 30d supply, fill #2
  Filled 2022-09-21: qty 16, 30d supply, fill #3

## 2021-11-10 ENCOUNTER — Ambulatory Visit (INDEPENDENT_AMBULATORY_CARE_PROVIDER_SITE_OTHER): Payer: Self-pay | Admitting: Plastic Surgery

## 2021-11-10 DIAGNOSIS — Z411 Encounter for cosmetic surgery: Secondary | ICD-10-CM

## 2021-11-10 NOTE — Progress Notes (Signed)
Patient presents to discuss Botox treatment.  She is bothered by static and dynamic lines in the forehead, glabella and crows feet.  We previously done 40 units of Botox or 120 units of Dysport and she has been happy with this dose.  We reviewed the risks and benefits of Botox treatment and she is interested in moving forward.  Forehead, glabella and crows feet were prepped with an alcohol pad and 40 units of Botox were distributed throughout in a standard injection pattern using 3 points for the crows feet and 2 rows for the forehead.  She tolerated this fine.  Lot number is [id number].  Expiration April 2025.  We will plan to see her at her next visit or sooner if she needs any touchups.  All of her questions were answered. ?

## 2021-12-01 ENCOUNTER — Encounter: Payer: Self-pay | Admitting: Family Medicine

## 2021-12-01 ENCOUNTER — Other Ambulatory Visit (INDEPENDENT_AMBULATORY_CARE_PROVIDER_SITE_OTHER): Payer: No Typology Code available for payment source

## 2021-12-01 DIAGNOSIS — R7989 Other specified abnormal findings of blood chemistry: Secondary | ICD-10-CM

## 2021-12-01 LAB — COMPREHENSIVE METABOLIC PANEL
ALT: 50 U/L — ABNORMAL HIGH (ref 0–35)
AST: 39 U/L — ABNORMAL HIGH (ref 0–37)
Albumin: 4.6 g/dL (ref 3.5–5.2)
Alkaline Phosphatase: 51 U/L (ref 39–117)
BUN: 11 mg/dL (ref 6–23)
CO2: 31 mEq/L (ref 19–32)
Calcium: 9.5 mg/dL (ref 8.4–10.5)
Chloride: 101 mEq/L (ref 96–112)
Creatinine, Ser: 0.81 mg/dL (ref 0.40–1.20)
GFR: 86.53 mL/min (ref >60.00)
Glucose, Bld: 79 mg/dL (ref 70–99)
Potassium: 4 mEq/L (ref 3.5–5.1)
Sodium: 139 mEq/L (ref 135–145)
Total Bilirubin: 0.5 mg/dL (ref 0.2–1.2)
Total Protein: 7.1 g/dL (ref 6.0–8.3)

## 2021-12-08 ENCOUNTER — Ambulatory Visit (INDEPENDENT_AMBULATORY_CARE_PROVIDER_SITE_OTHER): Payer: No Typology Code available for payment source | Admitting: Family Medicine

## 2021-12-08 ENCOUNTER — Other Ambulatory Visit (HOSPITAL_BASED_OUTPATIENT_CLINIC_OR_DEPARTMENT_OTHER): Payer: Self-pay

## 2021-12-08 VITALS — BP 126/72 | HR 70 | Temp 97.5°F | Resp 18 | Ht 65.0 in | Wt 149.8 lb

## 2021-12-08 DIAGNOSIS — K120 Recurrent oral aphthae: Secondary | ICD-10-CM

## 2021-12-08 MED ORDER — CLOBETASOL PROPIONATE 0.05 % EX GEL
1.0000 "application " | Freq: Three times a day (TID) | CUTANEOUS | 0 refills | Status: DC
  Filled 2021-12-08: qty 15, 20d supply, fill #0

## 2021-12-08 NOTE — Progress Notes (Signed)
Therapist, music at [facility] ?[address], [address] ?[place], [place] [address] ?336 [phone] ?Fax [phone] ? ?Date:  12/08/2021  ? ?Name:  Megan Mcmillan   DOB:  09/22/1975   MRN:  [id number] ? ?PCP:  Marin Olp, MD  ? ? ?Chief Complaint: Oral Pain ? ? ?History of Present Illness: ? ?Megan Mcmillan is a 46 y.o. very pleasant female patient who presents with the following: ? ?Pt seen today with concern of mouth pain and ulcer ?Yesterday am she noted a tender feeling in her right upper right gums/ lip ?It does not bother her for example to eat spicy or salty foods-she did not see anything on this ?However, this morning she noted continued tenderness- ?Pressing on her lip when she was doing her make-up this am was tender ?She looked under her lip and sees an ulcer on the dental aspect of the right upper lip ?Apparently 2 nights ago while she was asleep her dog was licking around her mouth, her husband mentioned this to her.  We are not sure if this could be related!  ?Otherwise she is not aware of any trauma or other cause ?No dental pain or difficulty chewing recently ? ?She is otherwise feeling well -no fever or chills, no other symptoms.  Never had this type of ulcer before ? ?Patient Active Problem List  ? Diagnosis Date Noted  ? Venous insufficiency 06/11/2019  ? Right knee pain 03/07/2019  ? Encounter for counseling 11/22/2018  ? Gluteal tendonitis of left buttock 11/05/2018  ? Somatic dysfunction of left sacroiliac joint 10/24/2018  ? Tendonitis 05/31/2018  ? Olecranon bursitis of right elbow 05/31/2018  ? Weakness of right hip 03/28/2018  ? Nonallopathic lesion of thoracic region 03/28/2018  ? Nonallopathic lesion of lumbosacral region 03/28/2018  ? Nonallopathic lesion of sacral region 03/28/2018  ? Seborrheic dermatitis 08/28/2017  ? Actinic keratosis 06/08/2015  ? Acromioclavicular sprain 04/29/2015  ? History of cervical cancer 08/19/2014  ? Vitamin D deficiency 08/19/2014  ?  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 Former smoker 08/19/2014  ? ? ?Past Medical History:  ?Diagnosis Date  ? Migraine 08/19/2014  ? No recurrence after birth of son. Previously on flexeril.    ? ? ?Past Surgical History:  ?Procedure Laterality Date  ? CERVICAL CONIZATION W/BX  1997  ? cold knife, no abnormal paps since.   ? VAGINAL DELIVERY  1998  ? ? ?Social History  ? ?Tobacco Use  ? Smoking status: Former  ?  Packs/day: 1.00  ?  Years: 21.00  ?  Pack years: 21.00  ?  Types: Cigarettes  ?  Quit date: 11/06/2010  ?  Years since quitting: 11.0  ? Smokeless tobacco: Never  ?Vaping Use  ? Vaping Use: Never used  ?Substance Use Topics  ? Alcohol use: Yes  ?  Alcohol/week: 5.0 standard drinks  ?  Types: 5 Standard drinks or equivalent per week  ? Drug use: No  ? ? ?Family History  ?Problem Relation Age of Onset  ? Hypertension Mother   ? Depression Mother   ?     "depression in entire family"  ? Heart attack Father 51  ?     smoked marijuana for many years  ? Hypertension Father   ? Depression Father   ? Breast cancer Maternal Grandmother   ?     53s  ? Diabetes Maternal Grandmother   ? Dementia Maternal Grandmother   ?  Colon cancer Neg Hx   ? Colon polyps Neg Hx   ? Esophageal cancer Neg Hx   ? Stomach cancer Neg Hx   ? Rectal cancer Neg Hx   ? ? ?Allergies  ?Allergen Reactions  ? Latex   ?  swelling  ? ? ?Medication list has been reviewed and updated. ? ?Current Outpatient Medications on File Prior to Visit  ?Medication Sig Dispense Refill  ? bimatoprost (LATISSE) 0.03 % ophthalmic solution Place one drop on applicator and apply evenly along the skin of the upper eyelid at base of eyelashes once daily at bedtime; repeat procedure for second eye (use a clean applicator). Patient may choose 3 ml size per preference 5 mL 12  ? clobetasol ointment (TEMOVATE) 0.05 % clobetasol 0.05 % topical ointment    ? fluorouracil (EFUDEX) 5 % cream Apply to lesions twice daily for 2 to 4 weeks (apply until inflammatory  response reaches erosion stage, then stop); complete healing may not be evident for 1 to 2 months following treatment. 40 g 0  ? fluticasone (FLONASE) 50 MCG/ACT nasal spray PLACE 2 SPRAYS INTO BOTH NOSTRILS AS NEEDED. 16 g 11  ? levonorgestrel (MIRENA, 52 MG,) 20 MCG/24HR IUD Mirena 20 mcg/24 hours (7 yrs) 52 mg intrauterine device    ? meloxicam (MOBIC) 7.5 MG tablet Take 1 tablet (7.5 mg total) by mouth daily. 30 tablet 0  ? tiZANidine (ZANAFLEX) 2 MG tablet Take 1 tablet (2 mg total) by mouth at bedtime. 30 tablet 0  ? triamterene-hydrochlorothiazide (MAXZIDE-25) 37.5-25 MG tablet Take 1/2 - 1 tablet by mouth daily as needed for edema 30 tablet 3  ? Vitamin D, Ergocalciferol, (DRISDOL) 1.25 MG (50000 UNIT) CAPS capsule Take 1 capsule (50,000 Units total) by mouth every 7 (seven) days. 12 capsule 0  ? ?No current facility-administered medications on file prior to visit.  ? ? ?Review of Systems: ? ?As per HPI- otherwise negative. ? ? ?Physical Examination: ?Vitals:  ? 12/08/21 1138  ?BP: 126/72  ?Pulse: 70  ?Resp: 18  ?Temp: (!) 97.5 ?F (36.4 ?C)  ?SpO2: 98%  ? ?Vitals:  ? 12/08/21 1138  ?Weight: 149 lb 12.8 oz (67.9 kg)  ?Height: '5\' 5"'$  (1.651 m)  ? ?Body mass index is 24.93 kg/m?. ?Ideal Body Weight: Weight in (lb) to have BMI = 25: 149.9 ? ?GEN: no acute distress.  Normal weight, looks well ?HEENT: Atraumatic, Normocephalic.  ?There is an approximately 8 mm diameter aphthous ulcer on the dental aspect of the right upper lip.  No other oral abnormalities noted ?Bilateral TM wnl, PEERL,EOMI.   ?Ears and Nose: No external deformity. ?CV: RRR, No M/G/R. No JVD. No thrill. No extra heart sounds. ?PULM: CTA B, no wheezes, crackles, rhonchi. No retractions. No resp. distress. No accessory muscle use. ?EXTR: No c/c/e ?PSYCH: Normally interactive. Conversant.  ? ?Assessment and Plan: ?Aphthous ulcer of mouth - Plan: clobetasol (TEMOVATE) 0.05 % GEL ?Patient seen today with uncomfortable aphthous ulcer as above ?She  otherwise feels well.  Patient did smoke when she was younger, not currently.  I advised her if this area does not heal up as expected we should look further.  For now we will treat with a topical steroid 2-3 times a day for few days as above.  She is asked to let me know if not significantly better by Monday ? ?Signed ?Lamar Blinks, MD ? ?

## 2021-12-09 ENCOUNTER — Other Ambulatory Visit (HOSPITAL_BASED_OUTPATIENT_CLINIC_OR_DEPARTMENT_OTHER): Payer: Self-pay

## 2021-12-12 ENCOUNTER — Ambulatory Visit
Admission: RE | Admit: 2021-12-12 | Discharge: 2021-12-12 | Disposition: A | Discharge status: Home / Self Care | Payer: No Typology Code available for payment source | Source: Ambulatory Visit | Attending: Family Medicine | Admitting: Family Medicine

## 2021-12-12 DIAGNOSIS — Z1231 Encounter for screening mammogram for malignant neoplasm of breast: Secondary | ICD-10-CM

## 2021-12-18 ENCOUNTER — Encounter: Payer: Self-pay | Admitting: Family Medicine

## 2021-12-19 ENCOUNTER — Other Ambulatory Visit: Payer: Self-pay

## 2021-12-19 MED ORDER — BIMATOPROST 0.03 % EX SOLN: CUTANEOUS | 12 refills | Status: DC

## 2021-12-28 ENCOUNTER — Encounter: Payer: No Typology Code available for payment source | Admitting: Plastic Surgery

## 2022-01-19 ENCOUNTER — Ambulatory Visit (INDEPENDENT_AMBULATORY_CARE_PROVIDER_SITE_OTHER): Payer: Self-pay | Admitting: Plastic Surgery

## 2022-01-19 DIAGNOSIS — Z411 Encounter for cosmetic surgery: Secondary | ICD-10-CM

## 2022-01-19 NOTE — Progress Notes (Signed)
Patient presents to discuss Botox treatment.  She is bothered by static and dynamic lines in the forehead, glabella and crows feet.  We previously done 40 units of Botox or 120 units of Dysport and she has been happy with this dose.  We reviewed the risks and benefits of Botox treatment and she is interested in moving forward.  Forehead, glabella and crows feet were prepped with an alcohol pad and 40 units of Botox were distributed throughout in a standard injection pattern using 3 points for the crows feet and 2 rows for the forehead.  She tolerated this fine.  We will plan to see her at her next visit or sooner if she needs any touchups.  All of her questions were answered.

## 2022-01-30 ENCOUNTER — Other Ambulatory Visit (HOSPITAL_BASED_OUTPATIENT_CLINIC_OR_DEPARTMENT_OTHER): Payer: Self-pay

## 2022-02-22 LAB — HM PAP SMEAR
HM Pap smear: NEGATIVE
HPV, high-risk: NEGATIVE

## 2022-03-28 ENCOUNTER — Other Ambulatory Visit: Payer: Self-pay | Admitting: Family Medicine

## 2022-03-28 ENCOUNTER — Encounter: Payer: Self-pay | Admitting: Family Medicine

## 2022-03-28 ENCOUNTER — Other Ambulatory Visit: Payer: Self-pay

## 2022-03-28 MED ORDER — BIMATOPROST 0.03 % EX SOLN
CUTANEOUS | 12 refills | Status: AC
Start: 2022-03-28 — End: ?

## 2022-03-29 ENCOUNTER — Other Ambulatory Visit (HOSPITAL_BASED_OUTPATIENT_CLINIC_OR_DEPARTMENT_OTHER): Payer: Self-pay

## 2022-03-29 MED ORDER — TRIAMTERENE-HCTZ 37.5-25 MG PO TABS
ORAL_TABLET | ORAL | 3 refills | Status: DC
  Filled 2022-03-29: qty 30, 30d supply, fill #0

## 2022-03-30 ENCOUNTER — Encounter: Payer: No Typology Code available for payment source | Admitting: Plastic Surgery

## 2022-03-30 ENCOUNTER — Encounter: Payer: Self-pay | Admitting: Surgical

## 2022-03-30 ENCOUNTER — Ambulatory Visit (INDEPENDENT_AMBULATORY_CARE_PROVIDER_SITE_OTHER): Payer: Self-pay | Admitting: Surgical

## 2022-03-30 DIAGNOSIS — Z411 Encounter for cosmetic surgery: Secondary | ICD-10-CM

## 2022-03-30 NOTE — Progress Notes (Signed)
Botulinum Toxin and Filler Injection Procedure Note  Procedure: Cosmetic botulinum toxin  Pre-operative Diagnosis: Dynamic rhytides  Post-operative Diagnosis: Same  Complications:  None  Brief history: The patient desires botulinum toxin injection. I discussed with the patient this proposed procedure of botulinum toxin injections, which is customized depending on the particular needs of the patient.  Patient has had Botox many times, she feels as if she still has some residual from her last injection so may need a smaller dose today.  She reports she would like injection of her forehead, crows feet and glabellar area as she has had in the past.  She is aware of the risks including but not limited to bleeding, asymmetry, need for additional injection, brow ptosis, bruising.  Procedure: The area was prepped with alcohol and dried with a clean gauze. Using a clean technique, the botulinum toxin was diluted with 2.5 cc of preservative-free normal saline which was slowly injected with an 18 gauge needle in a tuberculin syringes.  A 32 gauge needles were then used to inject the botulinum toxin. This mixture allow for an aliquot of 4 units per 0.1 cc in each injection site.    Subsequently the mixture was injected in the glabellar and forehead area with preservation of the temporal branch to the lateral eyebrow as well as into each lateral canthal area beginning from the lateral orbital rim medial to the zygomaticus major in 3 separate areas. A total of 30 Units of botulinum toxin was used. The forehead and glabellar area was injected with care to inject intramuscular only while holding pressure on the supratrochlear vessels in each area during each injection on either side of the medial corrugators. The injection proceeded vertically superiorly to the medial 2/3 of the frontalis muscle and superior 2/3 of the lateral frontalis, again with preservation of the frontal branch. No complications were noted.     Botox LOT:  [id number] EXP: 04/2024

## 2022-03-31 ENCOUNTER — Encounter: Payer: No Typology Code available for payment source | Admitting: Plastic Surgery

## 2022-04-28 ENCOUNTER — Ambulatory Visit (INDEPENDENT_AMBULATORY_CARE_PROVIDER_SITE_OTHER): Payer: No Typology Code available for payment source | Admitting: Family Medicine

## 2022-04-28 ENCOUNTER — Encounter: Payer: Self-pay | Admitting: Family Medicine

## 2022-04-28 VITALS — BP 118/78 | HR 57 | Temp 97.8°F | Resp 18 | Ht 65.0 in | Wt 150.0 lb

## 2022-04-28 DIAGNOSIS — L989 Disorder of the skin and subcutaneous tissue, unspecified: Secondary | ICD-10-CM | POA: Diagnosis not present

## 2022-04-28 DIAGNOSIS — L570 Actinic keratosis: Secondary | ICD-10-CM | POA: Diagnosis not present

## 2022-04-28 NOTE — Progress Notes (Signed)
Chief Complaint  Patient presents with   foot discomfort    Right foot     AFSA MEANY is a 47 y.o. female here for a skin complaint.  Duration: a few years Location: R outer foot Pruritic? No Painful? Yes Drainage? No New soaps/lotions/topicals/detergents? No Sick contacts? No Other associated symptoms: scraped on pool wall Therapies tried thus far: none Also scaly lesion on upper back region. Hx of AK's   Past Medical History:  Diagnosis Date   Migraine 08/19/2014   No recurrence after birth of son. Previously on flexeril.      BP 118/78 (BP Location: Left Arm, Patient Position: Sitting, Cuff Size: Normal)   Pulse (!) 57   Temp 97.8 F (36.6 C) (Temporal)   Resp 18   Ht '5\' 5"'$  (1.651 m)   Wt 150 lb (68 kg)   BMI 24.96 kg/m  Gen: awake, alert, appearing stated age Lungs: No accessory muscle use Skin: 0.4 cm excoriated lesion on lateral foot distal to ankle on R; slight hyperpigmentation.  On upper thor spine region just R of midline, there is a scaly lesion w pink base that is slightly raised.  Psych: Age appropriate judgment and insight  Procedure note: cryotherapy Verbal consent obtained 2 skin lesions treated Liquid nitrogen was applied via a thin spray creating an ice ball with 1-2 mm corona surrounding the lesion The patient tolerated the procedure well There were no immediate complications noted  Skin lesion - Plan: PR DESTRUCTION BENIGN LESIONS UP TO 14  Actinic keratosis - Plan: PR DESTRUC PREMAL,FIRST LESION  Shave biopsy if no improvement.  Shave biopsy if no improvement, E&C possibly after.  F/u prn. The patient voiced understanding and agreement to the plan.  [place], DO 04/28/22 11:10 AM

## 2022-04-28 NOTE — Patient Instructions (Signed)
Let me know if this does not improve.   Let us know if you need anything.

## 2022-05-01 ENCOUNTER — Encounter: Payer: Self-pay | Admitting: *Deleted

## 2022-05-05 ENCOUNTER — Other Ambulatory Visit (HOSPITAL_BASED_OUTPATIENT_CLINIC_OR_DEPARTMENT_OTHER): Payer: Self-pay

## 2022-05-16 ENCOUNTER — Ambulatory Visit (INDEPENDENT_AMBULATORY_CARE_PROVIDER_SITE_OTHER): Payer: No Typology Code available for payment source | Admitting: Family Medicine

## 2022-05-16 ENCOUNTER — Encounter: Payer: Self-pay | Admitting: Family Medicine

## 2022-05-16 VITALS — BP 108/68 | HR 87 | Temp 98.0°F | Ht 65.0 in | Wt 150.0 lb

## 2022-05-16 DIAGNOSIS — L570 Actinic keratosis: Secondary | ICD-10-CM

## 2022-05-16 NOTE — Patient Instructions (Signed)
Let me know if things don't improve.  Let us know if you need anything.

## 2022-05-16 NOTE — Progress Notes (Signed)
Chief Complaint  Patient presents with   Procedure    Megan Mcmillan is a 46 y.o. female here for a skin complaint.  Duration: several months Location: various spots on legs Pruritic? No Painful? No Drainage? No New soaps/lotions/topicals/detergents? No Other associated symptoms: scaly Therapies tried thus far: imiquod but it hurts and she stops She has a hx of AK's. Did not wear sunscreen routinely as a child.   Past Medical History:  Diagnosis Date   Migraine 08/19/2014   No recurrence after birth of son. Previously on flexeril.      BP 108/68 (BP Location: Left Arm, Patient Position: Sitting, Cuff Size: Normal)   Pulse 87   Temp 98 F (36.7 C) (Oral)   Ht '5\' 5"'$  (1.651 m)   Wt 150 lb (68 kg)   SpO2 97%   BMI 24.96 kg/m  Gen: awake, alert, appearing stated age Lungs: No accessory muscle use Skin: various scaly and hyperemic lesions on b/l LE's. No drainage, TTP, fluctuance, excoriation Psych: Age appropriate judgment and insight  Procedure note: cryotherapy Verbal consent obtained 9 skin lesions treated Liquid nitrogen was applied via a thin spray creating an ice ball with 1-2 mm corona surrounding the lesion The patient tolerated the procedure well There were no immediate complications noted  Actinic keratoses - Plan: PR DESTRUC BENIGN/PREMAL,2-14 LESIONS, PR DESTRUC PREMAL,FIRST LESION  Cryo today, historically works well. There is a lesion of concern that we will consider biopsy if this does not improve.  F/u prn. The patient voiced understanding and agreement to the plan.  [place], DO 05/16/22 3:32 PM

## 2022-06-15 ENCOUNTER — Encounter: Payer: Self-pay | Admitting: Family Medicine

## 2022-06-15 ENCOUNTER — Ambulatory Visit (INDEPENDENT_AMBULATORY_CARE_PROVIDER_SITE_OTHER): Payer: No Typology Code available for payment source | Admitting: Family Medicine

## 2022-06-15 ENCOUNTER — Encounter: Payer: No Typology Code available for payment source | Admitting: Plastic Surgery

## 2022-06-15 ENCOUNTER — Other Ambulatory Visit (HOSPITAL_BASED_OUTPATIENT_CLINIC_OR_DEPARTMENT_OTHER): Payer: Self-pay

## 2022-06-15 VITALS — BP 100/70 | HR 70 | Temp 98.1°F | Ht 65.0 in | Wt 146.0 lb

## 2022-06-15 DIAGNOSIS — R7401 Elevation of levels of liver transaminase levels: Secondary | ICD-10-CM

## 2022-06-15 DIAGNOSIS — M79644 Pain in right finger(s): Secondary | ICD-10-CM

## 2022-06-15 DIAGNOSIS — M79645 Pain in left finger(s): Secondary | ICD-10-CM | POA: Diagnosis not present

## 2022-06-15 DIAGNOSIS — F418 Other specified anxiety disorders: Secondary | ICD-10-CM | POA: Diagnosis not present

## 2022-06-15 MED ORDER — LORAZEPAM 0.5 MG PO TABS
0.5000 mg | ORAL_TABLET | Freq: Two times a day (BID) | ORAL | 0 refills | Status: DC | PRN
  Filled 2022-06-15: qty 8, 4d supply, fill #0

## 2022-06-15 NOTE — Progress Notes (Signed)
Phone [phone] In person visit   Subjective:   Megan Mcmillan is a 46 y.o. year old very pleasant female patient who presents for/with See problem oriented charting Chief Complaint  Patient presents with   Joint Pain    Pt c/o joint pain in her thumbs and thinks it may be arthritis.   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    Priority: Medium    Seborrheic dermatitis 08/28/2017    Priority: Low   Actinic keratosis 06/08/2015    Priority: Low   Acromioclavicular sprain 04/29/2015    Priority: Low   History of cervical cancer 08/19/2014    Priority: Low   Vitamin D deficiency 08/19/2014    Priority: Low   Former smoker 08/19/2014    Priority: Low   Right knee pain 03/07/2019   Encounter for counseling 11/22/2018   Gluteal tendonitis of left buttock 11/05/2018   Somatic dysfunction of left sacroiliac joint 10/24/2018   Tendonitis 05/31/2018   Olecranon bursitis of right elbow 05/31/2018   Weakness of right hip 03/28/2018   Nonallopathic lesion of thoracic region 03/28/2018   Nonallopathic lesion of lumbosacral region 03/28/2018   Nonallopathic lesion of sacral region 03/28/2018    Medications- reviewed and updated Current Outpatient Medications  Medication Sig Dispense Refill   bimatoprost (LATISSE) 0.03 % ophthalmic solution Place one drop on applicator and apply evenly along the skin of the upper eyelid at base of eyelashes once daily at bedtime; repeat procedure for second eye (use a clean applicator). Patient may choose 3 ml size per preference 5 mL 12   clobetasol (TEMOVATE) 0.05 % GEL Apply small amount to lip ulcer three times daily as needed. 15 g 0   fluorouracil (EFUDEX) 5 % cream Apply to lesions twice daily for 2 to 4 weeks (apply until inflammatory response reaches erosion stage, then stop); complete healing may not  be evident for 1 to 2 months following treatment. 40 g 0   fluticasone (FLONASE) 50 MCG/ACT nasal spray PLACE 2 SPRAYS INTO BOTH NOSTRILS AS NEEDED. 16 g 11   levonorgestrel (MIRENA, 52 MG,) 20 MCG/24HR IUD Mirena 20 mcg/24 hours (7 yrs) 52 mg intrauterine device     LORazepam (ATIVAN) 0.5 MG tablet Take 1 tablet (0.5 mg total) by mouth 2 (two) times daily as needed for sleep (with upcoming prolonged travel, do not drive or drink alcohol within 8 hours of use). 8 tablet 0   meloxicam (MOBIC) 7.5 MG tablet Take 1 tablet (7.5 mg total) by mouth daily. 30 tablet 0   tiZANidine (ZANAFLEX) 2 MG tablet Take 1 tablet (2 mg total) by mouth at bedtime. 30 tablet 0   triamterene-hydrochlorothiazide (MAXZIDE-25) 37.5-25 MG tablet Take 1/2 - 1 tablet by mouth daily as needed for edema 30 tablet 3   Vitamin D, Ergocalciferol, (DRISDOL) 1.25 MG (50000 UNIT) CAPS capsule Take 1 capsule (50,000 Units total) by mouth every 7 (seven) days. 12 capsule 0   No current facility-administered medications for this visit.     Objective:  BP 100/70   Pulse 70   Temp 98.1 F (36.7 C)   Ht '5\' 5"'$  (1.651 m)   Wt 146 lb (66.2 kg)   SpO2 98%   BMI 24.30 kg/m  Gen: NAD, resting comfortably Skin appears normal-reports actually mild relief of pain with palpation of CMC and IP  joints.  Good range of motion    Assessment and Plan   #Social update- will be grandma in may 2024- high risk as she is 40.  Her son  is very excited as is she!   #Travel related anxiety S:upcoming travel- 18 day trip to [place] and [place] including a cruise!. January cruise planned.  -flight 17-18 hours most likely- discussed being intentional about DVT prevention - tends towards anxiety with flights  A/P: With prolonged travel and travel related anxiety we opted to trial Ativan over something like Ambien for sleep alone.  Encouraged her to trial at least 1 dose before she leaves and may need a couple pills to acclimate when she gets  there as well as when she returns home to total of #8 Ativan given-recommended no alcohol or driving within 8 hours of use  #Elevated LFTs-reports about 7-8 alcoholic beverages per week-recommended full cessation especially with prior elevated AST and ALT.  She thinks this will be a challenge particularly with upcoming cruise   #Bilateral joint pain in her thumbs at [facility] joint S:Going on for a few weeks. Left hand worse and is right handed and holds phone in right hand. Some stiffness in the am- takes longer to improve in the winter. Due to pain occasionally has wanted to drop objects- feels harder to grip objects.  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  Could be osteoarthritis - With pain worse in the morning and some stiffness-try to see how long this lasts in the morning but we also consider doing some basic rheumatoid arthritis labs like rheumatoid factor and ANA-you can let me know if you fail to improve and we can order these -update me in a couple weeks to know how you did with this  Recommended follow up: Return for next already scheduled visit or sooner if needed.  "  Recommended follow up: Return for next already scheduled visit or sooner if needed. Future Appointments  Date Time Provider [place]  06/23/2022  8:45 AM Scheeler, Carola Rhine, PA-C [facility] None  09/01/2022  9:00 AM Marin Olp, MD [facility]    Lab/Order associations:   ICD-10-CM    1. Bilateral thumb pain  M79.644    M79.645     2. Situational anxiety  F41.8     3. Elevated transaminase level  R74.01      Meds ordered this encounter  Medications   LORazepam (ATIVAN) 0.5 MG tablet    Sig: Take 1 tablet (0.5 mg total) by mouth 2 (two) times daily as needed for sleep (with upcoming prolonged travel, do not drive or drink alcohol within 8 hours of use).    Dispense:  8 tablet    Refill:  0    Time Spent: 20 minutes of total time (9:55 AM-10:15 AM) was spent on the date of the encounter performing the following actions: obtaining history, performing a medically necessary exam, counseling on the  treatment plan and potential benefits and risks of different medicine classes, placing orders, and documenting in our EHR.    Return precautions advised.  Garret Reddish, MD

## 2022-06-15 NOTE — Patient Instructions (Addendum)
Lets trial Voltaren gel 4 times a day for 10 to 14 days to see if this will calm down the thumb pain.  Could be osteoarthritis - With pain worse in the morning and some stiffness-try to see how long this lasts in the morning but we also consider doing some basic rheumatoid arthritis labs like rheumatoid factor and ANA-you can let me know if you fail to improve and we can order these -update me in a couple weeks to know how you did with this  Recommended follow up: Return for next already scheduled visit or sooner if needed.

## 2022-06-16 ENCOUNTER — Encounter: Payer: No Typology Code available for payment source | Admitting: Plastic Surgery

## 2022-06-22 NOTE — Progress Notes (Signed)
Patient is a very pleasant 46 year old female here for Botox injections.  Her last injection was 03/30/2022 with me, she tolerated it well.  We used 30 units. She feels as if she would like a little bit more Botox this time, particularly around the eyes and her forehead.  Botulinum Toxin Procedure Note  Procedure: Cosmetic botulinum toxin  Pre-operative Diagnosis: Dynamic rhytides  Post-operative Diagnosis: Same  Complications:  None  Brief history: The patient desires botulinum toxin injection.  She is aware of the risks including bleeding, damage to deeper structures, asymmetry, brow ptosis, eyelid ptosis, bruising. The patient understands and wishes to proceed.  Procedure: The area was prepped with alcohol and dried with a clean gauze.  Using a clean technique the botulinum toxin was diluted with 2.5 mL of bacteriostatic saline per 100 unit vial which resulted in 4 units per 0.1 mL.  Subsequently the mixture was injected in the glabellar, lateral canthal lines, forehead area with preservation of the temporal branch to the lateral eyebrow. A total of 30 Units of botulinum toxin was used. The forehead and glabellar area was injected with care to inject intramuscular only while holding pressure on the supratrochlear vessels in each area during each injection on either side of the medial corrugators. The injection proceeded vertically superiorly to the medial 2/3 of the frontalis muscle and superior 2/3 of the lateral frontalis, again with preservation of the frontal branch.  No complications were noted. Light pressure was held for 5 minutes. She was instructed explicitly in post-operative care.  The forehead was injected in a 2 line standard pattern, the lateral canthal lines were injected with 2 injection sites per side.  Botox LOT:  [id number] EXP:  09/2024

## 2022-06-23 ENCOUNTER — Ambulatory Visit (INDEPENDENT_AMBULATORY_CARE_PROVIDER_SITE_OTHER): Payer: Self-pay | Admitting: Surgical

## 2022-06-23 ENCOUNTER — Encounter: Payer: Self-pay | Admitting: Surgical

## 2022-06-23 DIAGNOSIS — Z411 Encounter for cosmetic surgery: Secondary | ICD-10-CM

## 2022-07-04 ENCOUNTER — Encounter: Payer: No Typology Code available for payment source | Admitting: Plastic Surgery

## 2022-08-02 ENCOUNTER — Other Ambulatory Visit (HOSPITAL_BASED_OUTPATIENT_CLINIC_OR_DEPARTMENT_OTHER): Payer: Self-pay

## 2022-08-02 ENCOUNTER — Other Ambulatory Visit: Payer: Self-pay

## 2022-08-02 ENCOUNTER — Encounter: Payer: Self-pay | Admitting: Family Medicine

## 2022-08-02 MED ORDER — FLUCONAZOLE 150 MG PO TABS
150.0000 mg | ORAL_TABLET | Freq: Once | ORAL | 0 refills | Status: AC
  Filled 2022-08-02: qty 1, 1d supply, fill #0

## 2022-09-01 ENCOUNTER — Encounter: Payer: Self-pay | Admitting: Family Medicine

## 2022-09-01 ENCOUNTER — Ambulatory Visit (INDEPENDENT_AMBULATORY_CARE_PROVIDER_SITE_OTHER): Payer: 59 | Admitting: Family Medicine

## 2022-09-01 VITALS — BP 138/70 | HR 89 | Temp 98.2°F | Ht 65.0 in | Wt 144.2 lb

## 2022-09-01 DIAGNOSIS — D7589 Other specified diseases of blood and blood-forming organs: Secondary | ICD-10-CM | POA: Diagnosis not present

## 2022-09-01 DIAGNOSIS — E559 Vitamin D deficiency, unspecified: Secondary | ICD-10-CM

## 2022-09-01 DIAGNOSIS — Z87891 Personal history of nicotine dependence: Secondary | ICD-10-CM | POA: Diagnosis not present

## 2022-09-01 DIAGNOSIS — R7401 Elevation of levels of liver transaminase levels: Secondary | ICD-10-CM

## 2022-09-01 DIAGNOSIS — Z0000 Encounter for general adult medical examination without abnormal findings: Secondary | ICD-10-CM | POA: Diagnosis not present

## 2022-09-01 NOTE — Progress Notes (Signed)
Phone [phone]   Subjective:  Patient presents today for their annual physical. Chief complaint-noted.   See problem oriented charting- ROS- full  review of systems was completed and negative Per full ROS sheet completed by patient   The following were reviewed and entered/updated in epic: Past Medical History:  Diagnosis Date   Migraine 08/19/2014   No recurrence after birth of son. Previously on flexeril.     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   Gluteal tendonitis of left buttock 11/05/2018    Priority: 1.   Tendonitis 05/31/2018    Priority: 1.   Olecranon bursitis of right elbow 05/31/2018    Priority: 1.   Weakness of right hip 03/28/2018    Priority: 1.   Acromioclavicular sprain 04/29/2015    Priority: 1.   Past Surgical History:  Procedure Laterality Date   CERVICAL CONIZATION W/BX  1997   cold knife, no abnormal paps since.    VAGINAL DELIVERY  1998    Family History  Problem Relation Age of Onset   Hypertension Mother    Depression Mother        "depression in entire family"   Heart attack Father 36       smoked marijuana for many years   Hypertension Father    Depression Father    Breast cancer Maternal Grandmother        60s   Diabetes Maternal Grandmother    Dementia Maternal Grandmother    Colon cancer Neg Hx    Colon polyps Neg Hx    Esophageal cancer Neg Hx    Stomach cancer Neg Hx    Rectal cancer Neg Hx     Medications- reviewed and updated Current Outpatient Medications  Medication Sig  Dispense Refill   bimatoprost (LATISSE) 0.03 % ophthalmic solution Place one drop on applicator and apply evenly along the skin of the upper eyelid at base of eyelashes once daily at bedtime; repeat procedure for second eye (use a clean applicator). Patient may choose 3 ml size per preference 5 mL 12   fluticasone (FLONASE) 50 MCG/ACT nasal spray PLACE 2 SPRAYS INTO BOTH NOSTRILS AS NEEDED. 16 g 11   levonorgestrel (MIRENA, 52 MG,) 20 MCG/24HR IUD Mirena 20 mcg/24 hours (7 yrs) 52 mg intrauterine device     triamterene-hydrochlorothiazide (MAXZIDE-25) 37.5-25 MG tablet Take 1/2 - 1 tablet by mouth daily as needed for edema 30 tablet 3   No current facility-administered medications for this visit.    Allergies-reviewed and updated Allergies  Allergen Reactions   Latex     swelling    Social History   Social History Narrative   Married 2015. Son 9622 [name]- also patient of Dr. Yong Channel.     Finished HS.       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 Oropharynx normal Neck: no thyromegaly CV: RRR no murmurs rubs or gallops Lungs: CTAB no crackles, wheeze, rhonchi Abdomen: soft/nontender/nondistended/normal bowel sounds. No rebound or guarding.  Ext: no edema Skin: warm, dry Neuro: grossly normal, moves all extremities, PERRLA   Assessment and Plan   47 y.o. female presenting for annual physical.  Health Maintenance counseling: 1. Anticipatory guidance: Patient counseled regarding regular dental exams -q6 months, eye exams-low-grade prescription and wears regularly- just seen in  december,  avoiding smoking and second hand smoke, limiting alcohol to 1 beverage per day-up to 2 a day in the past but last visit down to 7 to 8/week-today reports , no illicit drugs .   2. Risk factor reduction:  Advised patient of need for regular exercise and diet rich and fruits and vegetables to reduce risk of heart attack and stroke.  Exercise-still enjoying [employer] 3 days a week- 60 mins each session.  Diet/weight management-weight down 2 pounds from last physical- reasonably healthy diet.  Wt Readings from Last 3 Encounters:  09/01/22 144 lb 3.2 oz (65.4 kg)  06/15/22 146 lb (66.2 kg)  05/16/22 150 lb (68 kg)  3. Immunizations/screenings/ancillary studies-holding off on COVID shot and otherwise up-to-date on vaccinations  Immunization History  Administered Date(s) Administered   Influenza,inj,Quad PF,6+ Mos 05/17/2019, 05/12/2020, 05/21/2022   Influenza-Unspecified 05/21/2014, 05/21/2015, 05/03/2016, 04/30/2017, 05/13/2021   PFIZER(Purple Top)SARS-COV-2 Vaccination 10/03/2019, 10/17/2019, 05/21/2020   Pfizer Covid-19 Vaccine Bivalent Booster 64yr & up 08/25/2021   Td 08/08/2013   4. Cervical cancer screening- Pap completed February 22, 2022 with Dr. HClaudina Lickneed a copy of the actual Pap  5. Breast cancer screening-  breast exam with GYN and mammogram 12/12/2021 6. Colon cancer screening - 12/01/2020 with 10-year follow-up 7. Skin cancer screening-regular follow-up with dermatology due to history of skin cancer- plans to schedule. advised regular sunscreen use. Denies worrisome, changing, or new skin lesions.  8. Birth control/STD check- IUD in place- and has 2 more years available- and monogamous 9. Osteoporosis screening at 619 we will plan on this 10. Smoking associated screening -former smoker-quit in 2012.  Get urinalysis  Status of chronic or acute concerns   #Social update- will be grandma in may 2024- high risk as she is 40. They remain excited- baby girl on the way!  Lorazepam worked well on the flight thankfully on the way back but avoided on the way avoided due to sudafed [place]   # Elevated LFTs-last visit down to 7-8 alcoholic beverages per week and recommended full cessation given AST/ALT elevation-intake was up on the cruise BUT she has stopped drinking in the week- 2-4 over course of weekend - will update LFTs with labs- hoping improved  # [facility]discussed possible arthritis and recommended Voltaren gel at last visit-she reports better but does better with warmer weather. Dog was licking voltaren so had to stop at home but uses [facility]  # Venous insufficiency-uses Maxzide as needed for edema- did not have to use even on her recent trip!    #Vitamin D deficiency S: Medication: Currently off- had  needed high dose in past  Last vitamin D Lab Results  Component Value Date   VD25OH 47.76 08/31/2021  A/P: hopefully stable- offered to  update vitamin D today- opts out but agrees to start 1000 units a day  # Screening hyperlipidemia-recheck 2027 Lab Results  Component Value Date   CHOL 160 08/27/2020   HDL 105.70 08/27/2020   LDLCALC 44 08/27/2020   TRIG 51.0 08/27/2020   CHOLHDL 2 08/27/2020   Recommended follow up: Return in about 1 year (around 09/02/2023) for physical or sooner if needed.Schedule b4 you leave.   Lab/Order associations:NOT fasting   ICD-10-CM   1. Preventative health care  Z00.00     2. Vitamin D deficiency  E55.9     3. Former smoker  Z87.891 Urinalysis, Routine w reflex microscopic    4. Macrocytosis  D75.89 CBC with Differential/Platelet    5. Elevated AST (SGOT)  R74.01 Comprehensive metabolic panel      No orders of the defined types were placed in this encounter.   Return precautions advised.  Garret Reddish, MD

## 2022-09-01 NOTE — Patient Instructions (Addendum)
Have them send [place] the actual report  Start 1000 units vitamin D  Have labs done at your office   Recommended follow up: Return in about 1 year (around 09/02/2023) for physical or sooner if needed.Schedule b4 you leave.

## 2022-09-04 ENCOUNTER — Ambulatory Visit (INDEPENDENT_AMBULATORY_CARE_PROVIDER_SITE_OTHER): Payer: 59 | Admitting: Family Medicine

## 2022-09-04 ENCOUNTER — Encounter: Payer: Self-pay | Admitting: Family Medicine

## 2022-09-04 ENCOUNTER — Other Ambulatory Visit (HOSPITAL_BASED_OUTPATIENT_CLINIC_OR_DEPARTMENT_OTHER): Payer: Self-pay

## 2022-09-04 VITALS — BP 132/77 | HR 63 | Temp 98.1°F | Resp 16 | Wt 145.0 lb

## 2022-09-04 DIAGNOSIS — L821 Other seborrheic keratosis: Secondary | ICD-10-CM | POA: Diagnosis not present

## 2022-09-04 MED ORDER — FLUOROURACIL 5 % EX CREA
TOPICAL_CREAM | CUTANEOUS | 0 refills | Status: DC
  Filled 2022-09-04: qty 40, 30d supply, fill #0

## 2022-09-04 NOTE — Progress Notes (Signed)
Subjective:   By signing my name below, I, Megan Mcmillan, attest that this documentation has been prepared under the direction and in the presence of Megan Held, DO 09/05/22   Patient ID: Megan Mcmillan, female    DOB: [birth year], 47 y.o.   MRN: [id number]  Chief Complaint  Patient presents with   Cryocauterization     Here for freezing of abnormal skin lesions    HPI Patient is in today for cryotherapy for skin lesions. She has multiple areas of seborrheic keratosis on her bilateral legs. She states that these have been present since a cruise to [place]. She has had multiple rounds of cryo done on these area by Dr Megan Mcmillan and Dr. Yong Mcmillan. She would also like a refill for her Fluorouracil cream as well.  Past Medical History:  Diagnosis Date   Migraine 08/19/2014   No recurrence after birth of son. Previously on flexeril.      Past Surgical History:  Procedure Laterality Date   CERVICAL CONIZATION W/BX  1997   cold knife, no abnormal paps since.    VAGINAL DELIVERY  1998    Family History  Problem Relation Age of Onset   Hypertension Mother    Depression Mother        "depression in entire family"   Heart attack Father 62       smoked marijuana for many years   Hypertension Father    Depression Father    Breast cancer Maternal Grandmother        60s   Diabetes Maternal Grandmother    Dementia Maternal Grandmother    Colon cancer Neg Hx    Colon polyps Neg Hx    Esophageal cancer Neg Hx    Stomach cancer Neg Hx    Rectal cancer Neg Hx     Social History   Socioeconomic History   Marital status: Married    Spouse name: Not on file   Number of children: Not on file   Years of education: Not on file   Highest education level: Not on file  Occupational History   Not on file  Tobacco Use   Smoking status: Former    Packs/day: 1.00    Years: 21.00    Total pack years: 21.00    Types: Cigarettes    Quit date: 11/06/2010    Years since quitting:  11.8   Smokeless tobacco: Never  Vaping Use   Vaping Use: Never used  Substance and Sexual Activity   Alcohol use: Yes    Alcohol/week: 5.0 standard drinks of alcohol    Types: 5 Standard drinks or equivalent per week   Drug use: No   Sexual activity: Not on file  Other Topics Concern   Not on file  Social History Narrative   Married 2015. Son 3614 Megan Mcmillan- also patient of Dr. Yong Mcmillan.     Finished HS.       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 [employer]   In the [employer], [place], Occupational psychologist, billing and coding-diverse medical background      Hobbies: football, reading, cooking, enjoys cleaning      Social Determinants of Radio broadcast assistant Strain: Not on [employer] Insecurity: Not on file  Transportation Needs: Not on file  Physical Activity: Not on file  Stress: Not on file  Social Connections: Not on file  Intimate Partner Violence: Not on file    Outpatient Medications Prior to  Visit  Medication Sig Dispense Refill   bimatoprost (LATISSE) 0.03 % ophthalmic solution Place one drop on applicator and apply evenly along the skin of the upper eyelid at base of eyelashes once daily at bedtime; repeat procedure for second eye (use a clean applicator). Patient may choose 3 ml size per preference 5 mL 12   fluticasone (FLONASE) 50 MCG/ACT nasal spray PLACE 2 SPRAYS INTO BOTH NOSTRILS AS NEEDED. 16 g 11   levonorgestrel (MIRENA, 52 MG,) 20 MCG/24HR IUD Mirena 20 mcg/24 hours (7 yrs) 52 mg intrauterine device     triamterene-hydrochlorothiazide (MAXZIDE-25) 37.5-25 MG tablet Take 1/2 - 1 tablet by mouth daily as needed for edema 30 tablet 3   No facility-administered medications prior to visit.    Allergies  Allergen Reactions   Latex     swelling    Review of Systems  Constitutional:  Negative for fever.  HENT:  Negative for congestion, sinus pain and sore throat.   Respiratory:  Negative for cough, shortness of breath and wheezing.    Cardiovascular:  Negative for chest pain and palpitations.  Gastrointestinal:  Negative for abdominal pain, constipation, diarrhea, nausea and vomiting.  Genitourinary:  Negative for dysuria, frequency and hematuria.  Skin:        + 14 areas of dry, scaly patches of skin on bilateral legs  Neurological:  Negative for headaches.       Objective:    Physical Exam Constitutional:      Appearance: Normal appearance. She is well-developed.  HENT:     Head: Normocephalic and atraumatic.  Eyes:     General: Lids are normal.     Extraocular Movements: Extraocular movements intact.     Conjunctiva/sclera: Conjunctivae normal.  Pulmonary:     Effort: Pulmonary effort is normal.  Musculoskeletal:        General: Normal range of motion.     Cervical back: Normal range of motion and neck supple.  Skin:    General: Skin is warm and dry.     Comments: 15 sk on legs treated with cryo ---  2 cycles 5-6 sec each   Pt tolerated procedure well  Neurological:     Mental Status: She is alert and oriented to person, place, and time.  Psychiatric:        Attention and Perception: Attention and perception normal.        Mood and Affect: Mood normal.        Behavior: Behavior normal.        Thought Content: Thought content normal.        Judgment: Judgment normal.     BP 132/77 (BP Location: Right Arm, Patient Position: Sitting, Cuff Size: Small)   Pulse 63   Temp 98.1 F (36.7 C) (Oral)   Resp 16   Wt 145 lb (65.8 kg)   SpO2 100%   BMI 24.13 kg/m  Wt Readings from Last 3 Encounters:  09/04/22 145 lb (65.8 kg)  09/01/22 144 lb 3.2 oz (65.4 kg)  06/15/22 146 lb (66.2 kg)       Assessment & Plan:  Seborrheic keratosis -     Fluorouracil; Apply to lesions twice daily for 2 to 4 weeks (apply until inflammatory response reaches erosion stage, then stop); complete healing may not be evident for 1 to 2 months following treatment.  Dispense: 40 g; Refill: 0     I,Alexis Herring,acting  as a scribe for [employer], DO.,have documented all relevant documentation on  the behalf of Megan Held, DO,as directed by  Megan Held, DO while in the presence of Megan Mcmillan, Ken Caryl, DO, personally preformed the services described in this documentation.  All medical record entries made by the scribe were at my direction and in my presence.  I have reviewed the chart and discharge instructions (if applicable) and agree that the record reflects my personal performance and is accurate and complete. 09/05/22   Megan Held, DO

## 2022-09-05 NOTE — Patient Instructions (Signed)
Cryoablation, Care After The following information offers guidance on how to care for yourself after your procedure. Your health care provider may also give you more specific instructions. If you have problems or questions, contact your health care provider. What can I expect after the procedure? After the procedure, it is common to have: Redness or blisters near the area treated. Mild pain and swelling. Follow these instructions at home: Treatment area care  If you have an incision, follow instructions from your health care provider about how to take care of it. Make sure you: Wash your hands with soap and water for at least 20 seconds before and after you change your bandage (dressing). If soap and water are not available, use hand sanitizer. Change your dressing as told by your health care provider. Leave stitches (sutures), skin glue, or adhesive strips in place. These skin closures may need to stay in place for 2 weeks or longer. If adhesive strip edges start to loosen and curl up, you may trim the loose edges. Do not remove adhesive strips completely unless your health care provider tells you to do that. Check your treatment area every day for signs of infection. Check for: More redness, swelling, or pain. Fluid or blood. Warmth. Pus or a bad smell. Keep the treated area clean and dry. Keep it covered with a dressing until it has healed. Clean the area with soap and water as told by your health care provider. If your dressing gets wet, change it right away. Activity  Follow instructions from your health care provider about what activities are safe for you. You may have to avoid lifting. Ask your health care provider how much you can safely lift. If you were given a sedative during the procedure, it can affect you for several hours. Do not drive or operate machinery until your health care provider says that it is safe. General instructions Take over-the-counter and prescription  medicines only as told by your health care provider. Do not use any products that contain nicotine or tobacco. These products include cigarettes, chewing tobacco, and vaping devices, such as e-cigarettes. These can delay incision healing. If you need help quitting, ask your health care provider. Do not take baths, swim, or use a hot tub until your health care provider approves. Ask your health care provider if you may take showers. You may only be allowed to take sponge baths. Keep all follow-up visits. Your health care provider may need to check that treatment worked and that there were no problems caused by the procedure. Contact a health care provider if: You have more pain. You have a fever. You have nausea or vomiting. You have any signs of infection. You do not have a bowel movement for 2 days. You cannot urinate, or you cannot control when you urinate or have a bowel movement (have incontinence). You develop impotence. Get help right away if: You have severe pain. You have trouble swallowing or breathing. You are very weak or dizzy. You have chest pain or shortness of breath. These symptoms may be an emergency. Get help right away. Call 911. Do not wait to see if the symptoms will go away. Do not drive yourself to the hospital. This information is not intended to replace advice given to you by your health care provider. Make sure you discuss any questions you have with your health care provider. Document Revised: 01/06/2022 Document Reviewed: 01/06/2022 Elsevier Patient Education  [place].

## 2022-09-22 ENCOUNTER — Encounter: Payer: Self-pay | Admitting: Surgical

## 2022-09-22 ENCOUNTER — Ambulatory Visit (INDEPENDENT_AMBULATORY_CARE_PROVIDER_SITE_OTHER): Payer: Self-pay | Admitting: Surgical

## 2022-09-22 VITALS — BP 131/86 | HR 71

## 2022-09-22 DIAGNOSIS — Z411 Encounter for cosmetic surgery: Secondary | ICD-10-CM

## 2022-09-22 NOTE — Progress Notes (Signed)
Patient is a very pleasant 47 year old female here for neuromodulator injections, she was last here on June 23, 2022, she reports she did well after that injection, but would like more in the lateral brow area if possible.    Botulinum Toxin Procedure Note  Procedure: Cosmetic botulinum toxin  Pre-operative Diagnosis: Dynamic rhytides  Post-operative Diagnosis: Same  Complications:  None  Brief history: The patient desires botulinum toxin injection.  She is aware of the risks including bleeding, damage to deeper structures, asymmetry, brow ptosis, eyelid ptosis, bruising. The patient understands and wishes to proceed.  Procedure: The area was prepped with alcohol and dried with a clean gauze.  Using a clean technique the botulinum toxin was diluted with 2.5 mL of bacteriostatic saline per 300 unit vial which resulted in 10 units per 0.1 mL.  Subsequently the mixture was injected in the glabellar, lateral canthal lines, forehead area with preservation of the temporal branch to the lateral eyebrow. A total of 110 Units of botulinum toxin was used. The forehead and glabellar area was injected with care to inject intramuscular only while holding pressure on the supratrochlear vessels in each area during each injection on either side of the medial corrugators. The injection proceeded vertically superiorly to the medial 2/3 of the frontalis muscle and superior 2/3 of the lateral frontalis, again with preservation of the frontal branch.  No complications were noted. Light pressure was held for 5 minutes. She was instructed explicitly in post-operative care.  Dysport LOT: [id number] EXP: May 2025

## 2022-09-29 ENCOUNTER — Telehealth: Payer: 59 | Admitting: Family

## 2022-09-29 ENCOUNTER — Telehealth: Payer: 59 | Admitting: Family Medicine

## 2022-09-29 ENCOUNTER — Ambulatory Visit: Payer: 59

## 2022-09-29 ENCOUNTER — Ambulatory Visit: Payer: 59 | Admitting: Family Medicine

## 2022-10-04 ENCOUNTER — Telehealth: Payer: 59 | Admitting: Family Medicine

## 2022-10-04 ENCOUNTER — Telehealth: Payer: 59 | Admitting: Internal Medicine

## 2022-10-04 ENCOUNTER — Ambulatory Visit: Payer: 59

## 2022-10-04 ENCOUNTER — Ambulatory Visit: Payer: 59 | Admitting: Medical

## 2022-10-13 ENCOUNTER — Ambulatory Visit (INDEPENDENT_AMBULATORY_CARE_PROVIDER_SITE_OTHER): Payer: Self-pay | Admitting: Surgical

## 2022-10-13 ENCOUNTER — Ambulatory Visit (INDEPENDENT_AMBULATORY_CARE_PROVIDER_SITE_OTHER): Payer: 59 | Admitting: Family Medicine

## 2022-10-13 ENCOUNTER — Encounter: Payer: Self-pay | Admitting: Family Medicine

## 2022-10-13 VITALS — BP 130/60 | HR 73 | Temp 97.7°F | Ht 65.0 in | Wt 147.8 lb

## 2022-10-13 DIAGNOSIS — R41840 Attention and concentration deficit: Secondary | ICD-10-CM | POA: Diagnosis not present

## 2022-10-13 DIAGNOSIS — Z411 Encounter for cosmetic surgery: Secondary | ICD-10-CM

## 2022-10-13 NOTE — Progress Notes (Signed)
Phone [phone] In person visit   Subjective:   Megan Mcmillan is a 47 y.o. year old very pleasant female patient who presents for/with See problem oriented charting Chief Complaint  Patient presents with   concentration    (No mask)  Pt is here with c/o decreased focus and concentration since starting new role and going back to school   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    Priority: Medium    Seborrheic dermatitis 08/28/2017    Priority: Low   Actinic keratosis 06/08/2015    Priority: Low   History of cervical cancer 08/19/2014    Priority: Low   Vitamin D deficiency 08/19/2014    Priority: Low   Former smoker 08/19/2014    Priority: Low   Right knee pain 03/07/2019    Priority: 1.   Gluteal tendonitis of left buttock 11/05/2018    Priority: 1.   Tendonitis 05/31/2018    Priority: 1.   Olecranon bursitis of right elbow 05/31/2018    Priority: 1.   Weakness of right hip 03/28/2018    Priority: 1.   Acromioclavicular sprain 04/29/2015    Priority: 1.    Medications- reviewed and updated Current Outpatient Medications  Medication Sig Dispense Refill   bimatoprost (LATISSE) 0.03 % ophthalmic solution Place one drop on applicator and apply evenly along the skin of the upper eyelid at base of eyelashes once daily at bedtime; repeat procedure for second eye (use a clean applicator). Patient may choose 3 ml size per preference 5 mL 12   fluorouracil (EFUDEX) 5 % cream Apply to lesions twice daily for 2 to 4 weeks (apply until inflammatory response reaches erosion stage, then stop); complete healing may not be evident for 1 to 2 months following treatment. 40 g 0   fluticasone (FLONASE) 50 MCG/ACT nasal spray PLACE 2 SPRAYS INTO BOTH NOSTRILS AS NEEDED. 16 g 11   levonorgestrel (MIRENA, 52 MG,) 20 MCG/24HR IUD Mirena 20 mcg/24  hours (7 yrs) 52 mg intrauterine device     No current facility-administered medications for this visit.     Objective:  BP 130/60   Pulse 73   Temp 97.7 F (36.5 C)   Ht '5\' 5"'$  (1.651 m)   Wt 147 lb 12.8 oz (67 kg)   SpO2 97%   BMI 24.60 kg/m  Gen: NAD, resting comfortably     Assessment and Plan    # Concentration concern S: Medication: None  She has had focus issues for several years. Son with ADD and she noted similarities in herself. She notes avoidance tendency. May be able to get herself to do mundane tasks but more complex tasks when overwhelmed are difficult to tackle.   -a lot of stress with new role at work- pushed school back to June and may push back to July. Was having nights of not being able to sleep with her thoughts racing.  Some improvement but still feels overwhelmed.  Pushes things off that take more focus/more intensive. Insomnia about a year ago.  No reported depression but does feel overwhelmed  Started with [facility] to help her with stress issues A/P: Patient with worsening concentration with increased work Scientist, physiological is working with a therapist to help her and I think that is going to be the main driver of her treatment.  She has a lot  of demands on her plate-I am thankful she moved back her schooling.  She also has a very high achiever and places a lot of demands on herself.  Her son does have ADD and she has had long-term concentration deficits and she is interested in medication if she does not fact have the diagnosis as well-given information for adult testing on ADD-she would also like to do genetic testing to find the most appropriate medication for her and will reach out to me about genesight.  This would also help guide if we needed an SSRI for stress related and medication   Recommended follow up: No follow-ups on file. Future Appointments  Date Time Provider [place]  09/05/2023  8:00 AM Marin Olp, MD [facility]     Lab/Order associations:   ICD-10-CM   1. Attention and concentration deficit  R41.840 Ambulatory referral to Psychology     Return precautions advised.  Garret Reddish, MD

## 2022-10-13 NOTE — Patient Instructions (Addendum)
Please call [phone] to schedule a visit with [facility] for ADD evaluation - please tell the office you were directly referred by Dr. Yong Channel  Continue with therapy   Recommended follow up: once we have evaluation on hand to determine net steps

## 2022-10-13 NOTE — Progress Notes (Signed)
Botulinum Toxin Procedure Note  Procedure: Cosmetic botulinum toxin  Pre-operative Diagnosis: Dynamic rhytides  Post-operative Diagnosis: Same  Complications:  None  Brief history: The patient desires botulinum toxin injection.  She is aware of the risks including bleeding, damage to deeper structures, asymmetry, brow ptosis, eyelid ptosis, bruising. The patient understands and wishes to proceed.  Procedure: The area was prepped with alcohol and dried with a clean gauze.  Using a clean technique the botulinum toxin was diluted with 2.5 mL of bacteriostatic saline per 100 unit vial which resulted in 4 units per 0.1 mL.  Subsequently the mixture was injected in the forehead area with preservation of the temporal branch to the lateral eyebrow. A total of 6 Units of botulinum toxin was used.  The forehead was injected where she had some asymmetry from previous neuromodulator injection 3 weeks ago.  She had some asymmetry on the left side above the brow as well as some dynamic rhytids along the upper portion of her forehead approximately 1 cm from her hairline.  No complications were noted. Light pressure was held for 5 minutes. She was instructed explicitly in post-operative care.  Botox LOT:  [id number] EXP:  2026/03

## 2022-10-18 ENCOUNTER — Encounter: Payer: Self-pay | Admitting: Family Medicine

## 2022-10-18 ENCOUNTER — Other Ambulatory Visit (HOSPITAL_COMMUNITY): Payer: Self-pay

## 2022-10-18 ENCOUNTER — Other Ambulatory Visit (HOSPITAL_BASED_OUTPATIENT_CLINIC_OR_DEPARTMENT_OTHER): Payer: Self-pay

## 2022-10-18 ENCOUNTER — Telehealth (INDEPENDENT_AMBULATORY_CARE_PROVIDER_SITE_OTHER): Payer: 59 | Admitting: Family Medicine

## 2022-10-18 DIAGNOSIS — J029 Acute pharyngitis, unspecified: Secondary | ICD-10-CM

## 2022-10-18 MED ORDER — AMOXICILLIN-POT CLAVULANATE 875-125 MG PO TABS
1.0000 | ORAL_TABLET | Freq: Two times a day (BID) | ORAL | 0 refills | Status: AC
  Filled 2022-10-18 (×2): qty 14, 7d supply, fill #0

## 2022-10-18 MED ORDER — FLUCONAZOLE 150 MG PO TABS
ORAL_TABLET | ORAL | 0 refills | Status: DC
  Filled 2022-10-18 (×2): qty 2, 3d supply, fill #0

## 2022-10-18 NOTE — Progress Notes (Signed)
CC: ST  Megan Mcmillan here for URI complaints. We are interacting via web portal for an electronic face-to-face visit. I verified patient's ID using 2 identifiers. Patient agreed to proceed with visit via this method. Patient is at home, I am at office. Patient and I are present for visit.   Duration: 2 days  Associated symptoms: sinus congestion, rhinorrhea, ear pain, sore throat, and myalgia Denies: sinus pain, ear drainage, wheezing, shortness of breath, and coughing Treatment to date: Claritin, Mucinex, INCS, Sudafed Sick contacts: No Tested neg for covid.   Past Medical History:  Diagnosis Date   Migraine 08/19/2014   No recurrence after birth of son. Previously on flexeril.      Objective No conversational dyspnea Age appropriate judgment and insight Nml affect and mood  Sore throat - Plan: amoxicillin-clavulanate (AUGMENTIN) 875-125 MG tablet, fluconazole (DIFLUCAN) 150 MG tablet  Empirically tx for strep. Diflucan prn. Continue to push fluids, practice good hand hygiene, cover mouth when coughing. F/u prn. If starting to experience worsening s/s's, shaking, or shortness of breath, seek immediate care. Pt voiced understanding and agreement to the plan.  [place], DO 10/18/22 7:08 AM

## 2022-10-19 ENCOUNTER — Other Ambulatory Visit (HOSPITAL_COMMUNITY): Payer: Self-pay

## 2022-10-20 ENCOUNTER — Ambulatory Visit: Payer: 59 | Admitting: Family Medicine

## 2022-10-20 ENCOUNTER — Other Ambulatory Visit (HOSPITAL_BASED_OUTPATIENT_CLINIC_OR_DEPARTMENT_OTHER): Payer: Self-pay

## 2022-11-20 ENCOUNTER — Encounter: Payer: Self-pay | Admitting: *Deleted

## 2022-11-23 ENCOUNTER — Other Ambulatory Visit: Payer: Self-pay | Admitting: Family Medicine

## 2022-11-27 ENCOUNTER — Other Ambulatory Visit (HOSPITAL_BASED_OUTPATIENT_CLINIC_OR_DEPARTMENT_OTHER): Payer: Self-pay

## 2022-11-27 MED ORDER — FLUTICASONE PROPIONATE 50 MCG/ACT NA SUSP
NASAL | 11 refills | Status: DC
  Filled 2022-11-27: qty 16, 30d supply, fill #0
  Filled 2023-03-13: qty 16, 30d supply, fill #1
  Filled 2023-06-08: qty 16, 30d supply, fill #2
  Filled 2023-07-25: qty 16, 30d supply, fill #3
  Filled 2023-10-01: qty 16, 30d supply, fill #4
  Filled 2023-11-05: qty 16, 30d supply, fill #5

## 2022-11-30 ENCOUNTER — Other Ambulatory Visit (HOSPITAL_BASED_OUTPATIENT_CLINIC_OR_DEPARTMENT_OTHER): Payer: Self-pay

## 2023-01-11 ENCOUNTER — Ambulatory Visit (INDEPENDENT_AMBULATORY_CARE_PROVIDER_SITE_OTHER): Payer: Self-pay | Admitting: Surgical

## 2023-01-11 DIAGNOSIS — Z411 Encounter for cosmetic surgery: Secondary | ICD-10-CM

## 2023-01-11 NOTE — Progress Notes (Signed)
Botulinum Toxin Procedure Note  Procedure: Cosmetic botulinum toxin  Pre-operative Diagnosis: Dynamic rhytides  Post-operative Diagnosis: Same  Complications:  None  Brief history: The patient desires botulinum toxin injection.  She is aware of the risks including bleeding, damage to deeper structures, asymmetry, brow ptosis, eyelid ptosis, bruising. The patient understands and wishes to proceed.  Procedure: The area was prepped with alcohol and dried with a clean gauze.  Using a clean technique the botulinum toxin was diluted with 2.5 mL of bacteriostatic saline per 100 unit vial which resulted in 4 units per 0.1 mL.  Subsequently the mixture was injected in the glabellar, lateral canthal lines, forehead area with preservation of the temporal branch to the lateral eyebrow. A total of 38 Units of botulinum toxin was used. The forehead and glabellar area was injected with care to inject intramuscular only while holding pressure on the supratrochlear vessels in each area during each injection on either side of the medial corrugators. The injection proceeded vertically superiorly to the medial 2/3 of the frontalis muscle and superior 2/3 of the lateral frontalis, again with preservation of the frontal branch.  No complications were noted. Light pressure was held for 5 minutes. She was instructed explicitly in post-operative care.  Botox LOT:  [id number] EXP:  12/2024

## 2023-01-17 ENCOUNTER — Ambulatory Visit (INDEPENDENT_AMBULATORY_CARE_PROVIDER_SITE_OTHER): Payer: 59 | Admitting: Psychology

## 2023-01-17 DIAGNOSIS — F89 Unspecified disorder of psychological development: Secondary | ICD-10-CM | POA: Diagnosis not present
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
## 2023-01-24 ENCOUNTER — Ambulatory Visit: Payer: 59 | Admitting: Psychology

## 2023-01-24 DIAGNOSIS — F89 Unspecified disorder of psychological development: Secondary | ICD-10-CM

## 2023-01-24 NOTE — Progress Notes (Signed)
Date: 01/06/2023   Appointment Start Time: 2:03pm Duration: 70 minutes Provider: Helmut Muster, PsyD Type of Session: Testing Appointment for Evaluation  Location of Patient: Home Location of Provider: Provider's Home (private office) Type of Contact: [employer] Teams video visit with audio  Session Content: Today's appointment was a telepsychological visit. Louwana is aware it is her responsibility to secure confidentiality on her end of the session. Prior to proceeding with today's appointment, Zanita's physical location at the time of this appointment was obtained as well a phone number she could be reached at in the event of technical difficulties. Azmariah denied anyone else being present in the room or on the virtual appointment. This provider reviewed that video should not be captured, photos should not be taken, nor should testing stimuli be copied or recorded as it would be a copyright violation. Tenny expressed understanding of the aforementioned, and verbally consented to proceed. The WAIS-IV was administered, scored, and interpreted by this evaluator. Tanekia is aware of the limitations of teleheath visits and verbally consented to proceed.  Billing codes will be input on the feedback appointment. There are no billing codes for the testing appointment.   Provisional DSM-5 diagnosis(es):  F89 Unspecified Disorder of Psychological Development   Plan: Mileidy was scheduled for a feedback appointment on 01/31/2023 at 2pm via [employer] Teams video visit with audio.                Margarite Gouge, PsyD

## 2023-01-31 ENCOUNTER — Encounter: Payer: Self-pay | Admitting: Family Medicine

## 2023-01-31 ENCOUNTER — Ambulatory Visit (INDEPENDENT_AMBULATORY_CARE_PROVIDER_SITE_OTHER): Payer: 59 | Admitting: Psychology

## 2023-01-31 DIAGNOSIS — F902 Attention-deficit hyperactivity disorder, combined type: Secondary | ICD-10-CM
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
## 2023-02-01 NOTE — Telephone Encounter (Signed)
Patient requesting GeneSight testing through our location, okay per PCP (see mychart message).  Okay to proceed with ordering?

## 2023-02-01 NOTE — Telephone Encounter (Signed)
GeneSight specimen collected.  [employer] Pick Up confirmation: [id number] Insurance information and consent included in packet.

## 2023-02-06 ENCOUNTER — Encounter: Payer: Self-pay | Admitting: Family Medicine

## 2023-02-07 ENCOUNTER — Other Ambulatory Visit (HOSPITAL_BASED_OUTPATIENT_CLINIC_OR_DEPARTMENT_OTHER): Payer: Self-pay

## 2023-02-07 ENCOUNTER — Ambulatory Visit (INDEPENDENT_AMBULATORY_CARE_PROVIDER_SITE_OTHER): Payer: 59 | Admitting: Sports Medicine

## 2023-02-07 VITALS — BP 120/80 | HR 75 | Ht 65.0 in | Wt 148.0 lb

## 2023-02-07 DIAGNOSIS — M545 Low back pain, unspecified: Secondary | ICD-10-CM | POA: Diagnosis not present

## 2023-02-07 DIAGNOSIS — M9902 Segmental and somatic dysfunction of thoracic region: Secondary | ICD-10-CM | POA: Diagnosis not present

## 2023-02-07 DIAGNOSIS — M9905 Segmental and somatic dysfunction of pelvic region: Secondary | ICD-10-CM | POA: Diagnosis not present

## 2023-02-07 DIAGNOSIS — M546 Pain in thoracic spine: Secondary | ICD-10-CM

## 2023-02-07 DIAGNOSIS — G8929 Other chronic pain: Secondary | ICD-10-CM | POA: Diagnosis not present

## 2023-02-07 DIAGNOSIS — M542 Cervicalgia: Secondary | ICD-10-CM | POA: Diagnosis not present

## 2023-02-07 DIAGNOSIS — M9903 Segmental and somatic dysfunction of lumbar region: Secondary | ICD-10-CM

## 2023-02-07 DIAGNOSIS — M9908 Segmental and somatic dysfunction of rib cage: Secondary | ICD-10-CM

## 2023-02-07 DIAGNOSIS — M9901 Segmental and somatic dysfunction of cervical region: Secondary | ICD-10-CM | POA: Diagnosis not present

## 2023-02-07 MED ORDER — MELOXICAM 15 MG PO TABS
15.0000 mg | ORAL_TABLET | Freq: Every day | ORAL | 0 refills | Status: DC
  Filled 2023-02-07: qty 14, 14d supply, fill #0

## 2023-02-07 NOTE — Progress Notes (Signed)
Megan Mcmillan D.[facility] [address] [place] [address] Phone: [phone]   Assessment and Plan:     1. Neck pain 2. Chronic left-sided thoracic back pain 3. Chronic left-sided low back pain without sciatica 4. Somatic dysfunction of cervical region 5. Somatic dysfunction of thoracic region 6. Somatic dysfunction of lumbar region 7. Somatic dysfunction of pelvic region 8. Somatic dysfunction of rib region -Chronic with exacerbation, subsequent visit - Recurrence of multiple musculoskeletal complaints, most likely flared due to patient's time caring for her granddaughter - Start meloxicam 15 mg daily x2 weeks.Do not to use additional NSAIDs while taking meloxicam.  May use Tylenol [phone] mg 2 to 3 times a day for breakthrough pain.  - Patient has received relief with OMT in the past.  Elects for repeat OMT today.  Tolerated well per note below. - Decision today to treat with OMT was based on Physical Exam   After verbal consent patient was treated with HVLA (high velocity low amplitude), ME (muscle energy), FPR (flex positional release), ST (soft tissue), PC/PD (Pelvic Compression/ Pelvic Decompression) techniques in cervical, rib, thoracic, lumbar, and pelvic areas. Patient tolerated the procedure well with improvement in symptoms.  Patient educated on potential side effects of soreness and recommended to rest, hydrate, and use Tylenol as needed for pain control.   Pertinent previous records reviewed include none   Follow Up: 3 weeks for reevaluation.  Could consider repeat OMT   Subjective:   I, Megan Mcmillan, am serving as a Neurosurgeon for Doctor Richardean Sale  Chief Complaint: MSK   HPI:   02/07/23 Patient is a 47 year old female complaining of left sided tightness  . Patient states that she needs and adjustment left side is flared, she was holding her premi grand daughter on the left side and she feels lopsided   Relevant Historical  Information: None pertinent  Additional pertinent review of systems negative.  Current Outpatient Medications  Medication Sig Dispense Refill   bimatoprost (LATISSE) 0.03 % ophthalmic solution Place one drop on applicator and apply evenly along the skin of the upper eyelid at base of eyelashes once daily at bedtime; repeat procedure for second eye (use a clean applicator). Patient may choose 3 ml size per preference 5 mL 12   fluconazole (DIFLUCAN) 150 MG tablet Take 1 tablet by mouth, repeat in 72 hours if no improvement. 2 tablet 0   fluorouracil (EFUDEX) 5 % cream Apply to lesions twice daily for 2 to 4 weeks (apply until inflammatory response reaches erosion stage, then stop); complete healing may not be evident for 1 to 2 months following treatment. 40 g 0   fluticasone (FLONASE) 50 MCG/ACT nasal spray PLACE 2 SPRAYS INTO BOTH NOSTRILS AS NEEDED. 16 g 11   levonorgestrel (MIRENA, 52 MG,) 20 MCG/24HR IUD Mirena 20 mcg/24 hours (7 yrs) 52 mg intrauterine device     meloxicam (MOBIC) 15 MG tablet Take 1 tablet (15 mg total) by mouth daily. 14 tablet 0   No current facility-administered medications for this visit.      Objective:     Vitals:   02/07/23 1122  BP: 120/80  Pulse: 75  SpO2: 99%  Weight: 148 lb (67.1 kg)  Height: 5\' 5"  (1.651 m)      Body mass index is 24.63 kg/m.    Physical Exam:     General: Well-appearing, cooperative, sitting comfortably in no acute distress.   OMT Physical Exam:  ASIS Compression Test: Positive  left Cervical: TTP paraspinal, C3-5 RLSL Rib: Bilateral elevated first rib with NTTP Thoracic: TTP paraspinal, T3-5 RLSR, T6-9 RRSL Lumbar: TTP paraspinal, L1-3 RLSR Pelvis: Left posterior innominate  Electronically signed by:  Megan Mcmillan D.[facility] 12:18 PM 02/07/23

## 2023-02-07 NOTE — Patient Instructions (Signed)
-   Start meloxicam 15 mg daily x2 weeks.  Do not to use additional NSAIDs while taking meloxicam.  May use Tylenol [phone] mg 2 to 3 times a day for breakthrough pain. 3 week follow up MSK

## 2023-02-07 NOTE — Telephone Encounter (Signed)
Genesight report received.  Reviewed by Dr. Laury Axon, faxed to PCP for next steps.  Fax confirmation received.

## 2023-02-15 ENCOUNTER — Other Ambulatory Visit (HOSPITAL_BASED_OUTPATIENT_CLINIC_OR_DEPARTMENT_OTHER): Payer: Self-pay

## 2023-02-16 ENCOUNTER — Ambulatory Visit: Payer: 59 | Admitting: Psychology

## 2023-02-17 MED ORDER — DEXMETHYLPHENIDATE HCL ER 5 MG PO CP24
5.0000 mg | ORAL_CAPSULE | Freq: Every day | ORAL | 0 refills | Status: DC
  Filled 2023-02-17: qty 30, 30d supply, fill #0

## 2023-02-19 ENCOUNTER — Other Ambulatory Visit: Payer: Self-pay

## 2023-02-19 ENCOUNTER — Other Ambulatory Visit (HOSPITAL_BASED_OUTPATIENT_CLINIC_OR_DEPARTMENT_OTHER): Payer: Self-pay

## 2023-02-27 NOTE — Progress Notes (Unsigned)
   Aleen Sells D.[name] [address] [place] [address] Phone: [phone]   Assessment and Plan:     There are no diagnoses linked to this encounter.  *** - Patient has received relief with OMT in the past.  Elects for repeat OMT today.  Tolerated well per note below. - Decision today to treat with OMT was based on Physical Exam   After verbal consent patient was treated with HVLA (high velocity low amplitude), ME (muscle energy), FPR (flex positional release), ST (soft tissue), PC/PD (Pelvic Compression/ Pelvic Decompression) techniques in cervical, rib, thoracic, lumbar, and pelvic areas. Patient tolerated the procedure well with improvement in symptoms.  Patient educated on potential side effects of soreness and recommended to rest, hydrate, and use Tylenol as needed for pain control.   Pertinent previous records reviewed include ***   Follow Up: ***     Subjective:   I, Caidon Foti, am serving as a Neurosurgeon for Doctor Richardean Sale  Chief Complaint: MSK    HPI:    02/07/23 Patient is a 47 year old female complaining of left sided tightness  . Patient states that she needs and adjustment left side is flared, she was holding her premi grand daughter on the left side and she feels lopsided   02/28/2023 Patient states   Relevant Historical Information: None pertinent  Additional pertinent review of systems negative.  Current Outpatient Medications  Medication Sig Dispense Refill   bimatoprost (LATISSE) 0.03 % ophthalmic solution Place one drop on applicator and apply evenly along the skin of the upper eyelid at base of eyelashes once daily at bedtime; repeat procedure for second eye (use a clean applicator). Patient may choose 3 ml size per preference 5 mL 12   dexmethylphenidate (FOCALIN XR) 5 MG 24 hr capsule Take 1 capsule (5 mg total) by mouth daily. 30 capsule 0   fluconazole (DIFLUCAN) 150 MG tablet Take 1 tablet by mouth, repeat  in 72 hours if no improvement. 2 tablet 0   fluorouracil (EFUDEX) 5 % cream Apply to lesions twice daily for 2 to 4 weeks (apply until inflammatory response reaches erosion stage, then stop); complete healing may not be evident for 1 to 2 months following treatment. 40 g 0   fluticasone (FLONASE) 50 MCG/ACT nasal spray PLACE 2 SPRAYS INTO BOTH NOSTRILS AS NEEDED. 16 g 11   levonorgestrel (MIRENA, 52 MG,) 20 MCG/24HR IUD Mirena 20 mcg/24 hours (7 yrs) 52 mg intrauterine device     meloxicam (MOBIC) 15 MG tablet Take 1 tablet (15 mg total) by mouth daily. 14 tablet 0   No current facility-administered medications for this visit.      Objective:     There were no vitals filed for this visit.    There is no height or weight on file to calculate BMI.    Physical Exam:     General: Well-appearing, cooperative, sitting comfortably in no acute distress.   OMT Physical Exam:  ASIS Compression Test: Positive Right Cervical: TTP paraspinal, *** Rib: Bilateral elevated first rib with TTP Thoracic: TTP paraspinal,*** Lumbar: TTP paraspinal,*** Pelvis: Right anterior innominate  Electronically signed by:  Aleen Sells D.[name] 7:21 AM 02/27/23

## 2023-02-28 ENCOUNTER — Ambulatory Visit: Payer: 59 | Admitting: Sports Medicine

## 2023-02-28 VITALS — BP 122/72 | HR 102 | Ht 65.0 in | Wt 146.0 lb

## 2023-02-28 DIAGNOSIS — G8929 Other chronic pain: Secondary | ICD-10-CM | POA: Diagnosis not present

## 2023-02-28 DIAGNOSIS — M9901 Segmental and somatic dysfunction of cervical region: Secondary | ICD-10-CM | POA: Diagnosis not present

## 2023-02-28 DIAGNOSIS — M9903 Segmental and somatic dysfunction of lumbar region: Secondary | ICD-10-CM

## 2023-02-28 DIAGNOSIS — M9908 Segmental and somatic dysfunction of rib cage: Secondary | ICD-10-CM

## 2023-02-28 DIAGNOSIS — M9905 Segmental and somatic dysfunction of pelvic region: Secondary | ICD-10-CM | POA: Diagnosis not present

## 2023-02-28 DIAGNOSIS — M542 Cervicalgia: Secondary | ICD-10-CM

## 2023-02-28 DIAGNOSIS — M546 Pain in thoracic spine: Secondary | ICD-10-CM | POA: Diagnosis not present

## 2023-02-28 DIAGNOSIS — M9902 Segmental and somatic dysfunction of thoracic region: Secondary | ICD-10-CM | POA: Diagnosis not present

## 2023-03-07 ENCOUNTER — Ambulatory Visit: Payer: 59 | Admitting: Family Medicine

## 2023-03-08 ENCOUNTER — Ambulatory Visit: Payer: 59 | Admitting: Psychology

## 2023-03-13 ENCOUNTER — Other Ambulatory Visit: Payer: Self-pay | Admitting: Family Medicine

## 2023-03-14 ENCOUNTER — Ambulatory Visit (INDEPENDENT_AMBULATORY_CARE_PROVIDER_SITE_OTHER): Payer: Self-pay | Admitting: Surgical

## 2023-03-14 ENCOUNTER — Other Ambulatory Visit (HOSPITAL_BASED_OUTPATIENT_CLINIC_OR_DEPARTMENT_OTHER): Payer: Self-pay

## 2023-03-14 ENCOUNTER — Encounter: Payer: Self-pay | Admitting: Surgical

## 2023-03-14 DIAGNOSIS — Z411 Encounter for cosmetic surgery: Secondary | ICD-10-CM

## 2023-03-14 MED ORDER — DEXMETHYLPHENIDATE HCL ER 5 MG PO CP24
5.0000 mg | ORAL_CAPSULE | Freq: Every day | ORAL | 0 refills | Status: DC
  Filled 2023-03-14: qty 10, 10d supply, fill #0
  Filled 2023-03-16: qty 30, 30d supply, fill #0
  Filled 2023-03-19: qty 20, 20d supply, fill #0
  Filled 2023-03-20: qty 10, 10d supply, fill #0

## 2023-03-14 NOTE — Progress Notes (Signed)
Botulinum Toxin Procedure Note  Procedure: Cosmetic botulinum toxin  Pre-operative Diagnosis: Dynamic rhytides  Post-operative Diagnosis: Same  Complications:  None  Brief history: The patient desires botulinum toxin injection.  She is aware of the risks including bleeding, damage to deeper structures, asymmetry, brow ptosis, eyelid ptosis, bruising. The patient understands and wishes to proceed.  Procedure: The area was prepped with alcohol and dried with a clean gauze.  Using a clean technique the botulinum toxin was diluted with 2.5 mL of bacteriostatic saline per 100 unit vial which resulted in 4 units per 0.1 mL.  Subsequently the mixture was injected in the procerus, lateral canthal lines, forehead area with preservation of the temporal branch to the lateral eyebrow. A total of 28 Units of botulinum toxin was used. The forehead and glabellar area was injected with care to inject intramuscular only while holding pressure on the supratrochlear vessels in each area during each injection on either side of the medial corrugators. The injection proceeded vertically superiorly to the medial 2/3 of the frontalis muscle and superior 2/3 of the lateral frontalis, again with preservation of the frontal branch.  No complications were noted. Light pressure was held for 5 minutes. She was instructed explicitly in post-operative care.  Botox LOT:  [id number] EXP:  02/2025

## 2023-03-16 ENCOUNTER — Other Ambulatory Visit (HOSPITAL_BASED_OUTPATIENT_CLINIC_OR_DEPARTMENT_OTHER): Payer: Self-pay

## 2023-03-19 ENCOUNTER — Encounter (HOSPITAL_BASED_OUTPATIENT_CLINIC_OR_DEPARTMENT_OTHER): Payer: Self-pay

## 2023-03-19 ENCOUNTER — Other Ambulatory Visit (HOSPITAL_BASED_OUTPATIENT_CLINIC_OR_DEPARTMENT_OTHER): Payer: Self-pay

## 2023-03-20 ENCOUNTER — Other Ambulatory Visit: Payer: Self-pay

## 2023-03-20 ENCOUNTER — Other Ambulatory Visit (HOSPITAL_BASED_OUTPATIENT_CLINIC_OR_DEPARTMENT_OTHER): Payer: Self-pay

## 2023-04-02 ENCOUNTER — Other Ambulatory Visit: Payer: Self-pay | Admitting: Family Medicine

## 2023-04-02 ENCOUNTER — Encounter (INDEPENDENT_AMBULATORY_CARE_PROVIDER_SITE_OTHER): Payer: Self-pay

## 2023-04-02 DIAGNOSIS — Z0000 Encounter for general adult medical examination without abnormal findings: Secondary | ICD-10-CM

## 2023-04-03 ENCOUNTER — Ambulatory Visit
Admission: RE | Admit: 2023-04-03 | Discharge: 2023-04-03 | Disposition: A | Discharge status: Home / Self Care | Payer: 59 | Source: Ambulatory Visit | Attending: Family Medicine | Admitting: Family Medicine

## 2023-04-03 DIAGNOSIS — Z1231 Encounter for screening mammogram for malignant neoplasm of breast: Secondary | ICD-10-CM | POA: Diagnosis not present

## 2023-04-03 DIAGNOSIS — Z0000 Encounter for general adult medical examination without abnormal findings: Secondary | ICD-10-CM

## 2023-04-13 ENCOUNTER — Encounter: Payer: Self-pay | Admitting: Family Medicine

## 2023-04-16 ENCOUNTER — Encounter: Payer: Self-pay | Admitting: Family Medicine

## 2023-04-16 ENCOUNTER — Ambulatory Visit (INDEPENDENT_AMBULATORY_CARE_PROVIDER_SITE_OTHER): Payer: 59 | Admitting: Family Medicine

## 2023-04-16 VITALS — BP 133/63 | HR 76 | Temp 97.7°F | Ht 65.0 in

## 2023-04-16 DIAGNOSIS — J029 Acute pharyngitis, unspecified: Secondary | ICD-10-CM | POA: Diagnosis not present

## 2023-04-16 LAB — POCT RAPID STREP A (OFFICE): Rapid Strep A Screen: NEGATIVE

## 2023-04-16 NOTE — Progress Notes (Signed)
Acute Office Visit  Subjective:     Patient ID: GERVAISE MEGGETT, female    DOB: July 01, 1976, 47 y.o.   MRN: [id number]  Chief Complaint  Patient presents with   Sore Throat     Patient is in today for sore throat.   Discussed the use of AI scribe software for clinical note transcription with the patient, who gave verbal consent to proceed.  History of Present Illness   The patient presented with a 2-3 day history of sore throat. The symptom onset was initially unilateral, affecting the left side, but by the second day, it had become bilateral. The patient described the sensation as if an ibuprofen tablet was stuck in their throat. Accompanying the sore throat was a mild runny nose, but no other symptoms such as coughing, sneezing, fever, or fatigue were reported. The patient denied any noticeable swelling in the throat/neck area. The patient also mentioned experiencing itchiness in the ears, but no pain. The patient's symptoms did not prevent them from engaging in their daily activities. No known sick contacts.           ROS All review of systems negative except what is listed in the HPI      Objective:    BP 133/63   Pulse 76   Temp 97.7 F (36.5 C) (Oral)   Ht 5\' 5"  (1.651 m)   SpO2 100%   BMI 24.30 kg/m    Physical Exam Vitals reviewed.  Constitutional:      Appearance: Normal appearance.  HENT:     Head: Normocephalic and atraumatic.     Right Ear: Tympanic membrane normal.     Left Ear: Tympanic membrane normal.     Mouth/Throat:     Mouth: Mucous membranes are moist.     Pharynx: Oropharynx is clear. Posterior oropharyngeal erythema present. No oropharyngeal exudate.     Comments: Cobblestoning/PND Eyes:     Extraocular Movements: Extraocular movements intact.     Conjunctiva/sclera: Conjunctivae normal.  Cardiovascular:     Rate and Rhythm: Normal rate and regular rhythm.     Heart sounds: Normal heart sounds.  Pulmonary:     Effort: Pulmonary  effort is normal.     Breath sounds: Normal breath sounds.  Musculoskeletal:     Cervical back: Normal range of motion and neck supple. No tenderness.  Lymphadenopathy:     Cervical: No cervical adenopathy.  Skin:    General: Skin is warm and dry.  Neurological:     Mental Status: She is alert and oriented to person, place, and time.  Psychiatric:        Mood and Affect: Mood normal.        Behavior: Behavior normal.        Thought Content: Thought content normal.        Judgment: Judgment normal.         Results for orders placed or performed in visit on 04/16/23  POCT rapid strep A  Result Value Ref Range   Rapid Strep A Screen Negative Negative        Assessment & Plan:   Problem List Items Addressed This Visit   None Visit Diagnoses     Sore throat    -  Primary   Relevant Orders   POCT rapid strep A (Completed)         Pharyngitis Two-day history of sore throat, initially unilateral now bilateral. No associated fever, cough, or sneezing. Mild rhinorrhea. No lymphadenopathy on  examination.    - Home COVID test negative   - Rapid strep test negative   - Likely viral or allergic. Continue supportive measures including rest, hydration, humidifier use, steam showers, warm liquids with lemon and honey, and over-the-counter cough, cold, antihistamines, analgesics as needed.      No orders of the defined types were placed in this encounter.   Return if symptoms worsen or fail to improve.  Clayborne Dana, NP

## 2023-04-19 ENCOUNTER — Other Ambulatory Visit (HOSPITAL_BASED_OUTPATIENT_CLINIC_OR_DEPARTMENT_OTHER): Payer: Self-pay

## 2023-04-19 ENCOUNTER — Other Ambulatory Visit: Payer: Self-pay

## 2023-04-19 MED ORDER — DEXMETHYLPHENIDATE HCL ER 10 MG PO CP24
10.0000 mg | ORAL_CAPSULE | Freq: Every day | ORAL | 0 refills | Status: DC
  Filled 2023-04-19: qty 30, 30d supply, fill #0

## 2023-04-20 ENCOUNTER — Other Ambulatory Visit (HOSPITAL_BASED_OUTPATIENT_CLINIC_OR_DEPARTMENT_OTHER): Payer: Self-pay

## 2023-04-20 ENCOUNTER — Other Ambulatory Visit: Payer: Self-pay

## 2023-04-23 ENCOUNTER — Other Ambulatory Visit (HOSPITAL_BASED_OUTPATIENT_CLINIC_OR_DEPARTMENT_OTHER): Payer: Self-pay

## 2023-04-23 ENCOUNTER — Encounter: Payer: Self-pay | Admitting: Family Medicine

## 2023-04-23 ENCOUNTER — Ambulatory Visit (INDEPENDENT_AMBULATORY_CARE_PROVIDER_SITE_OTHER): Payer: 59 | Admitting: Family Medicine

## 2023-04-23 VITALS — BP 136/72 | HR 71 | Temp 98.0°F | Resp 18 | Ht 65.0 in | Wt 148.8 lb

## 2023-04-23 DIAGNOSIS — K149 Disease of tongue, unspecified: Secondary | ICD-10-CM

## 2023-04-23 MED ORDER — PREDNISONE 20 MG PO TABS
ORAL_TABLET | ORAL | 0 refills | Status: DC
  Filled 2023-04-23: qty 15, 5d supply, fill #0

## 2023-04-23 MED ORDER — EPINEPHRINE 0.3 MG/0.3ML IJ SOAJ
0.3000 mg | INTRAMUSCULAR | 0 refills | Status: DC | PRN
Start: 2023-04-23 — End: 2023-07-25
  Filled 2023-04-23: qty 2, 30d supply, fill #0

## 2023-04-23 MED ORDER — NYSTATIN 100000 UNIT/ML MT SUSP
5.0000 mL | Freq: Four times a day (QID) | ORAL | 1 refills | Status: DC | PRN
Start: 2023-04-23 — End: 2023-07-25
  Filled 2023-04-23: qty 120, 6d supply, fill #0

## 2023-04-23 MED ORDER — FLUCONAZOLE 150 MG PO TABS
150.0000 mg | ORAL_TABLET | Freq: Once | ORAL | 0 refills | Status: AC
Start: 2023-04-23 — End: 2023-04-25
  Filled 2023-04-23: qty 2, 2d supply, fill #0

## 2023-04-23 NOTE — Progress Notes (Signed)
[facility] [address] [place], [place] [address] [phone]  Date:  04/23/2023   Name:  Megan Mcmillan   DOB:  01/20/1976   MRN:  [id number]  PCP:  Shelva Majestic, MD    Chief Complaint: Follow-up (Possible allergic reaction onset 04/20/2023./Pt states she is not able to drink anything hot /Pt has started taking zyrtec 04/23/2023)   History of Present Illness:  Megan Mcmillan is a 47 y.o. very pleasant female patient who presents with the following:  Pt seen today with concern of possible allergic reaction Last week she noted a ST and really no other symptoms.  She had a strep test on the ninth which was negative On Friday- today is Monday- she noted she noted that the left side of her tongue felt tingly and hot Yesterday am she awoke and her lips were swollen. She took benadryl and this improved-her lips now with normal She has stayed on benadryl since yesterday; has been taking on a scheduled basis to avoid any further swelling  She is also using Claritin- she doubled up on her dose yesterday- and today decided to try Zyrtec instead No difficulty breathing, no hives or rash, no wheezing Getting anything hot on her tongue is uncomfortable, also any spicy or acidic foods are very painful to her tongue For example, she tried to eat pizza but the sauce burned her tongue.  A bowl of cereal however was okay  No new meds, vitamins, supplements, etc No fever or cough  No other neuro sx-no facial drooping, no slurred speech, no numbness or tingling anywhere in her body Her ears are itchy    Patient Active Problem List   Diagnosis Date Noted   Venous insufficiency 06/11/2019   Right knee pain 03/07/2019   Gluteal tendonitis of left buttock 11/05/2018   Tendonitis 05/31/2018   Olecranon bursitis of right elbow 05/31/2018   Weakness of right hip 03/28/2018   Seborrheic dermatitis 08/28/2017   Actinic keratosis  06/08/2015   Acromioclavicular sprain 04/29/2015   History of cervical cancer 08/19/2014   Vitamin D deficiency 08/19/2014   History of squamous cell carcinoma 08/19/2014   Former smoker 08/19/2014    Past Medical History:  Diagnosis Date   Migraine 08/19/2014   No recurrence after birth of son. Previously on flexeril.      Past Surgical History:  Procedure Laterality Date   CERVICAL CONIZATION W/BX  1997   cold knife, no abnormal paps since.    VAGINAL DELIVERY  1998    Social History   Tobacco Use   Smoking status: Former    Current packs/day: 0.00    Average packs/day: 1 pack/day for 21.0 years (21.0 ttl pk-yrs)    Types: Cigarettes    Start date: 11/05/1989    Quit date: 11/06/2010    Years since quitting: 12.4   Smokeless tobacco: Never  Vaping Use   Vaping status: Never Used  Substance Use Topics   Alcohol use: Yes    Alcohol/week: 5.0 standard drinks of alcohol    Types: 5 Standard drinks or equivalent per week   Drug use: No    Family History  Problem Relation Age of Onset   Hypertension Mother    Depression Mother        "depression in entire family"   Heart attack Father 11       smoked marijuana for many years   Hypertension Father  Depression Father    Breast cancer Maternal Grandmother        69s   Diabetes Maternal Grandmother    Dementia Maternal Grandmother    Colon cancer Neg Hx    Colon polyps Neg Hx    Esophageal cancer Neg Hx    Stomach cancer Neg Hx    Rectal cancer Neg Hx     Allergies  Allergen Reactions   Latex     swelling   Wellbutrin [Bupropion]     Vivid dreams/night terrors    Medication list has been reviewed and updated.  Current Outpatient Medications on File Prior to Visit  Medication Sig Dispense Refill   bimatoprost (LATISSE) 0.03 % ophthalmic solution Place one drop on applicator and apply evenly along the skin of the upper eyelid at base of eyelashes once daily at bedtime; repeat procedure for second eye (use a  clean applicator). Patient may choose 3 ml size per preference 5 mL 12   dexmethylphenidate (FOCALIN XR) 10 MG 24 hr capsule Take 1 capsule (10 mg total) by mouth daily. 30 capsule 0   fluconazole (DIFLUCAN) 150 MG tablet Take 1 tablet by mouth, repeat in 72 hours if no improvement. 2 tablet 0   fluorouracil (EFUDEX) 5 % cream Apply to lesions twice daily for 2 to 4 weeks (apply until inflammatory response reaches erosion stage, then stop); complete healing may not be evident for 1 to 2 months following treatment. 40 g 0   fluticasone (FLONASE) 50 MCG/ACT nasal spray PLACE 2 SPRAYS INTO BOTH NOSTRILS AS NEEDED. 16 g 11   levonorgestrel (MIRENA, 52 MG,) 20 MCG/24HR IUD Mirena 20 mcg/24 hours (7 yrs) 52 mg intrauterine device     meloxicam (MOBIC) 15 MG tablet Take 1 tablet (15 mg total) by mouth daily. 14 tablet 0   No current facility-administered medications on file prior to visit.    Review of Systems:  As per HPI- otherwise negative.   Physical Examination: Vitals:   04/23/23 1523  BP: 136/72  Pulse: 71  Resp: 18  Temp: 98 F (36.7 C)  SpO2: 98%   Vitals:   04/23/23 1523  Weight: 148 lb 12.8 oz (67.5 kg)  Height: 5\' 5"  (1.651 m)   Body mass index is 24.76 kg/m. Ideal Body Weight: Weight in (lb) to have BMI = 25: 149.9  GEN: no acute distress.  Normal weight, looks well HEENT: Atraumatic, Normocephalic. Bilateral TM wnl, oropharynx normal.  PEERL,EOMI. her tongue appears inflamed and raw, especially along the left edge No other oral lesions or ulcers are noted At this time there is no evidence of angioedema, lips appear normal No rashes or hives Ears and Nose: No external deformity. CV: RRR, No M/G/R. No JVD. No thrill. No extra heart sounds. PULM: CTA B, no wheezes, crackles, rhonchi. No retractions. No resp. distress. No accessory muscle use. EXTR: No c/c/e PSYCH: Normally interactive. Conversant.  Normal strength, sensation, DTR of all limbs, normal facial sensation  and movement  Assessment and Plan: Tongue irritation - Plan: predniSONE (DELTASONE) 20 MG tablet, EPINEPHrine (EPIPEN 2-PAK) 0.3 mg/0.3 mL IJ SOAJ injection, fluconazole (DIFLUCAN) 150 MG tablet, diphenhydrAMINE-nystatin-alum & mag hydroxide-simeth-lidocaine  Patient seen today with tongue irritation.  This may or may not be related to illness from about 10 days ago when she had a severe sore throat.  She had a negative strep test and notes she self tested for COVID and was negative as well  For the time being we will treat for  irritation and discomfort of her tongue as well as lip swelling which may indicate an allergic reaction of some sort Can continue Benadryl for the next few days as needed, also Zyrtec or other nonsedating antihistamine of choice Prednisone taper, gave EpiPen and counseled her to become familiar with use.  Will have her use a Magic mouthwash and also take Diflucan in case there is an element of thrush. I have asked her to keep me closely posted about her symptoms  Signed Abbe Amsterdam, MD

## 2023-04-24 ENCOUNTER — Other Ambulatory Visit (HOSPITAL_BASED_OUTPATIENT_CLINIC_OR_DEPARTMENT_OTHER): Payer: Self-pay

## 2023-05-22 ENCOUNTER — Other Ambulatory Visit (HOSPITAL_BASED_OUTPATIENT_CLINIC_OR_DEPARTMENT_OTHER): Payer: Self-pay

## 2023-05-22 ENCOUNTER — Other Ambulatory Visit: Payer: Self-pay | Admitting: Family Medicine

## 2023-05-22 MED ORDER — DEXMETHYLPHENIDATE HCL ER 10 MG PO CP24
10.0000 mg | ORAL_CAPSULE | Freq: Every day | ORAL | 0 refills | Status: DC
  Filled 2023-05-22: qty 30, 30d supply, fill #0

## 2023-06-06 ENCOUNTER — Ambulatory Visit: Payer: 59 | Admitting: Dermatology

## 2023-06-06 ENCOUNTER — Encounter: Payer: Self-pay | Admitting: Dermatology

## 2023-06-06 VITALS — BP 133/89 | HR 83

## 2023-06-06 DIAGNOSIS — Z1283 Encounter for screening for malignant neoplasm of skin: Secondary | ICD-10-CM

## 2023-06-06 DIAGNOSIS — B070 Plantar wart: Secondary | ICD-10-CM

## 2023-06-06 DIAGNOSIS — C4491 Basal cell carcinoma of skin, unspecified: Secondary | ICD-10-CM

## 2023-06-06 DIAGNOSIS — L578 Other skin changes due to chronic exposure to nonionizing radiation: Secondary | ICD-10-CM | POA: Diagnosis not present

## 2023-06-06 DIAGNOSIS — D492 Neoplasm of unspecified behavior of bone, soft tissue, and skin: Secondary | ICD-10-CM | POA: Diagnosis not present

## 2023-06-06 DIAGNOSIS — C44729 Squamous cell carcinoma of skin of left lower limb, including hip: Secondary | ICD-10-CM | POA: Diagnosis not present

## 2023-06-06 DIAGNOSIS — L570 Actinic keratosis: Secondary | ICD-10-CM | POA: Diagnosis not present

## 2023-06-06 DIAGNOSIS — B079 Viral wart, unspecified: Secondary | ICD-10-CM

## 2023-06-06 DIAGNOSIS — L821 Other seborrheic keratosis: Secondary | ICD-10-CM

## 2023-06-06 DIAGNOSIS — C4492 Squamous cell carcinoma of skin, unspecified: Secondary | ICD-10-CM

## 2023-06-06 DIAGNOSIS — D0471 Carcinoma in situ of skin of right lower limb, including hip: Secondary | ICD-10-CM

## 2023-06-06 DIAGNOSIS — L814 Other melanin hyperpigmentation: Secondary | ICD-10-CM

## 2023-06-06 DIAGNOSIS — W908XXA Exposure to other nonionizing radiation, initial encounter: Secondary | ICD-10-CM

## 2023-06-06 DIAGNOSIS — D485 Neoplasm of uncertain behavior of skin: Secondary | ICD-10-CM

## 2023-06-06 DIAGNOSIS — D1801 Hemangioma of skin and subcutaneous tissue: Secondary | ICD-10-CM

## 2023-06-06 DIAGNOSIS — C44519 Basal cell carcinoma of skin of other part of trunk: Secondary | ICD-10-CM

## 2023-06-06 DIAGNOSIS — D229 Melanocytic nevi, unspecified: Secondary | ICD-10-CM

## 2023-06-06 DIAGNOSIS — D099 Carcinoma in situ, unspecified: Secondary | ICD-10-CM

## 2023-06-06 HISTORY — DX: Carcinoma in situ, unspecified: D09.9

## 2023-06-06 HISTORY — DX: Basal cell carcinoma of skin, unspecified: C44.91

## 2023-06-06 HISTORY — DX: Squamous cell carcinoma of skin, unspecified: C44.92

## 2023-06-06 NOTE — Patient Instructions (Addendum)

## 2023-06-06 NOTE — Progress Notes (Signed)
New Patient Visit   Subjective  Megan Mcmillan is a 47 y.o. female who presents for the following:  Total Body Skin Exam (TBSE)  Patient present today for new patient visit for TBSE.The patient reports she has spots, moles and lesions to be evaluated, some may be new or changing and the patient may have concern these could be cancer. Patient has previously been treated by a dermatologist. Patient reports she has hx of bx (BCC). Patient reports family history of skin cancers (Dad). Patient reports throughout her lifetime has had  severe  sun exposure. Currently, patient reports if she has excessive sun exposure, she does apply sunscreen and/or wears protective coverings.  The following portions of the chart were reviewed this encounter and updated as appropriate: medications, allergies, medical history  Review of Systems:  No other skin or systemic complaints except as noted in HPI or Assessment and Plan.  Objective  Well appearing patient in no apparent distress; mood and affect are within normal limits.  A full examination was performed including scalp, head, eyes, ears, nose, lips, neck, chest, axillae, abdomen, back, buttocks, bilateral upper extremities, bilateral lower extremities, hands, feet, fingers, toes, fingernails, and toenails. All findings within normal limits unless otherwise noted below.   Relevant exam findings are noted in the Assessment and Plan. Left Anterior Shin 5 mm Pink Papule   Right Lower Leg - Anterior 5 mm Pink crusted Papule  Right Mid Back 6 mm Pink Pearly Papule  Left Lower Back 5cm Pink Crusted Papuel  Left 2nd Metatarsal Plantar Area Verrucous papules   Right Hip Verrucous papules   Chest - Medial (Center) (2), Left Breast, Left Thigh - Anterior (2), Mid Frontal Scalp, Right Lower Leg - Anterior Erythematous thin papules/macules with gritty scale.     Assessment & Plan   LENTIGINES, SEBORRHEIC KERATOSES, HEMANGIOMAS - Benign normal skin  lesions - Benign-appearing - Call for any changes  MELANOCYTIC NEVI - Tan-brown and/or pink-flesh-colored symmetric macules and papules - Benign appearing on exam today - Observation - Call clinic for new or changing moles - Recommend daily use of broad spectrum spf 30+ sunscreen to sun-exposed areas.   ACTINIC DAMAGE - Chronic condition, secondary to cumulative UV/sun exposure - diffuse scaly erythematous macules with underlying dyspigmentation - Recommend daily broad spectrum sunscreen SPF 30+ to sun-exposed areas, reapply every 2 hours as needed.  - Staying in the shade or wearing long sleeves, sun glasses (UVA+UVB protection) and wide brim hats (4-inch brim around the entire circumference of the hat) are also recommended for sun protection.  - Call for new or changing lesions.  ACTINIC KERATOSIS Exam: Erythematous thin papules/macules with gritty scale at the forehead and chest  Actinic keratoses are precancerous spots that appear secondary to cumulative UV radiation exposure/sun exposure over time. They are chronic with expected duration over 1 year. A portion of actinic keratoses will progress to squamous cell carcinoma of the skin. It is not possible to reliably predict which spots will progress to skin cancer and so treatment is recommended to prevent development of skin cancer.  Recommend daily broad spectrum sunscreen SPF 30+ to sun-exposed areas, reapply every 2 hours as needed.  Recommend staying in the shade or wearing long sleeves, sun glasses (UVA+UVB protection) and wide brim hats (4-inch brim around the entire circumference of the hat). Call for new or changing lesions.  SKIN CANCER SCREENING PERFORMED TODAY  Neoplasm of uncertain behavior of skin (4) Left Anterior Shin  Skin / nail biopsy  Type of biopsy: tangential   Informed consent: discussed and consent obtained   Timeout: patient name, date of birth, surgical site, and procedure verified   Procedure prep:   Patient was prepped and draped in usual sterile fashion Prep type:  Isopropyl alcohol Anesthesia: the lesion was anesthetized in a standard fashion   Anesthetic:  1% lidocaine w/ epinephrine 1-100,000 buffered w/ 8.4% NaHCO3 Instrument used: DermaBlade   Hemostasis achieved with: aluminum chloride   Outcome: patient tolerated procedure well   Post-procedure details: sterile dressing applied and wound care instructions given   Dressing type: petrolatum gauze and bandage    Specimen 1 - Surgical pathology Differential Diagnosis: NMSC  Check Margins: No  Right Lower Leg - Anterior  Skin / nail biopsy Type of biopsy: tangential   Informed consent: discussed and consent obtained   Timeout: patient name, date of birth, surgical site, and procedure verified   Procedure prep:  Patient was prepped and draped in usual sterile fashion Prep type:  Isopropyl alcohol Anesthesia: the lesion was anesthetized in a standard fashion   Anesthetic:  1% lidocaine w/ epinephrine 1-100,000 buffered w/ 8.4% NaHCO3 Instrument used: DermaBlade   Hemostasis achieved with: aluminum chloride   Outcome: patient tolerated procedure well   Post-procedure details: sterile dressing applied and wound care instructions given   Dressing type: petrolatum gauze and bandage    Specimen 2 - Surgical pathology Differential Diagnosis: BCC  Check Margins: No  Right Mid Back  Skin / nail biopsy Type of biopsy: tangential   Informed consent: discussed and consent obtained   Timeout: patient name, date of birth, surgical site, and procedure verified   Procedure prep:  Patient was prepped and draped in usual sterile fashion Prep type:  Isopropyl alcohol Anesthesia: the lesion was anesthetized in a standard fashion   Anesthetic:  1% lidocaine w/ epinephrine 1-100,000 buffered w/ 8.4% NaHCO3 Instrument used: DermaBlade   Hemostasis achieved with: aluminum chloride   Outcome: patient tolerated procedure well    Post-procedure details: sterile dressing applied and wound care instructions given   Dressing type: petrolatum gauze and bandage    Specimen 3 - Surgical pathology Differential Diagnosis: Pigmented BCC  Check Margins: No  Left Lower Back  Skin / nail biopsy Type of biopsy: tangential   Informed consent: discussed and consent obtained   Timeout: patient name, date of birth, surgical site, and procedure verified   Procedure prep:  Patient was prepped and draped in usual sterile fashion Prep type:  Isopropyl alcohol Anesthesia: the lesion was anesthetized in a standard fashion   Anesthetic:  1% lidocaine w/ epinephrine 1-100,000 buffered w/ 8.4% NaHCO3 Instrument used: DermaBlade   Hemostasis achieved with: aluminum chloride   Outcome: patient tolerated procedure well   Post-procedure details: sterile dressing applied and wound care instructions given   Dressing type: petrolatum gauze and bandage    Specimen 4 - Surgical pathology Differential Diagnosis: SCC  Check Margins: No  Plantar wart Left 2nd Metatarsal Plantar Area  Verruca Right Hip  Destruction of lesion - Right Hip  Destruction method: cryotherapy   Informed consent: discussed and consent obtained   Timeout:  patient name, date of birth, surgical site, and procedure verified Lesion destroyed using liquid nitrogen: Yes   Outcome: patient tolerated procedure well with no complications    AK (actinic keratosis) (7) Left Thigh - Anterior (2); Right Lower Leg - Anterior; Chest - Medial [facility]) (2); Left Breast; Mid Frontal Scalp  Destruction of lesion - Chest - Medial (  Center) (2), Left Breast, Left Thigh - Anterior (2), Mid Frontal Scalp, Right Lower Leg - Anterior  Destruction method: cryotherapy   Informed consent: discussed and consent obtained   Timeout:  patient name, date of birth, surgical site, and procedure verified Lesion destroyed using liquid nitrogen: Yes   Cryotherapy cycles:  6 Outcome:  patient tolerated procedure well with no complications    Return in about 3 months (around 09/06/2023) for  AK F/U.   Documentation: I have reviewed the above documentation for accuracy and completeness, and I agree with the above.  Stasia Cavalier, am acting as scribe for Langston Reusing, DO.  Langston Reusing, DO

## 2023-06-08 DIAGNOSIS — H52223 Regular astigmatism, bilateral: Secondary | ICD-10-CM | POA: Diagnosis not present

## 2023-06-08 DIAGNOSIS — H5203 Hypermetropia, bilateral: Secondary | ICD-10-CM | POA: Diagnosis not present

## 2023-06-08 DIAGNOSIS — H524 Presbyopia: Secondary | ICD-10-CM | POA: Diagnosis not present

## 2023-06-11 LAB — SURGICAL PATHOLOGY

## 2023-06-18 ENCOUNTER — Encounter: Payer: Self-pay | Admitting: Dermatology

## 2023-06-18 ENCOUNTER — Telehealth: Payer: Self-pay

## 2023-06-18 DIAGNOSIS — C4491 Basal cell carcinoma of skin, unspecified: Secondary | ICD-10-CM | POA: Insufficient documentation

## 2023-06-18 DIAGNOSIS — D099 Carcinoma in situ, unspecified: Secondary | ICD-10-CM | POA: Insufficient documentation

## 2023-06-18 DIAGNOSIS — C4492 Squamous cell carcinoma of skin, unspecified: Secondary | ICD-10-CM | POA: Insufficient documentation

## 2023-06-18 NOTE — Telephone Encounter (Signed)
LVM to discuss results.

## 2023-06-18 NOTE — Progress Notes (Signed)
Hi Megan Mcmillan can you please call pt with bx results.  She will need Mohs for the [facility] on the left anterior shin and Surgical Excisions for the right mid back and left lower back.  We will refer to Dr Caralyn Guile for all of those.  For the lesion on the right lower leg ( specimen B) that one was superficial and we can treat with my in office with an ED& C and topical 5-fu.    Diagnosis 1. Skin , left anterior shin --> MOHS w/ Dr Caralyn Guile WELL DIFFERENTIATED SQUAMOUS CELL CARCINOMA WITH SUPERFICIAL INFILTRATION  2. Skin , right lower leg, anterior  ED&C w/Dr Maricela Bo CELL CARCINOMA IN SITU ARISING IN ACTINIC KERATOSIS  3. Skin , right mid back --> SE w/ Dr Caralyn Guile BASAL CELL CARCINOMA, SUPERFICIAL AND NODULAR PATTERNS  4. Skin , left lower back --> SE w/ Dr Caralyn Guile BASAL CELL CARCINOMA, SUPERFICIAL AND NODULAR PATTERNS

## 2023-06-18 NOTE — Telephone Encounter (Signed)
Pt has called back and has been informed of results and expressed understanding.

## 2023-06-18 NOTE — Telephone Encounter (Signed)
Results released and results management sent to [place] to call pt and send Mohs referral.  Thanks!

## 2023-06-18 NOTE — Telephone Encounter (Signed)
-----   Message from Langston Reusing sent at 06/18/2023 12:23 PM EST ----- Hi Skylee Baird can you please call pt with bx results.  She will need Mohs for the [facility] on the left anterior shin and Surgical Excisions for the right mid back and left lower back.  We will refer to Dr Caralyn Guile for all of those.  For the lesion on the right lower leg ( specimen B) that one was superficial and we can treat with my in office with an ED& C and topical 5-fu.    Diagnosis 1. Skin , left anterior shin --> MOHS w/ Dr Caralyn Guile WELL DIFFERENTIATED SQUAMOUS CELL CARCINOMA WITH SUPERFICIAL INFILTRATION  2. Skin , right lower leg, anterior  ED&C w/Dr Maricela Bo CELL CARCINOMA IN SITU ARISING IN ACTINIC KERATOSIS  3. Skin , right mid back --> SE w/ Dr Caralyn Guile BASAL CELL CARCINOMA, SUPERFICIAL AND NODULAR PATTERNS  4. Skin , left lower back --> SE w/ Dr Caralyn Guile BASAL CELL CARCINOMA, SUPERFICIAL AND NODULAR PATTERNS

## 2023-06-20 ENCOUNTER — Ambulatory Visit (INDEPENDENT_AMBULATORY_CARE_PROVIDER_SITE_OTHER): Payer: Self-pay | Admitting: Surgical

## 2023-06-20 DIAGNOSIS — Z411 Encounter for cosmetic surgery: Secondary | ICD-10-CM

## 2023-06-20 NOTE — Progress Notes (Signed)
Botulinum Toxin Procedure Note  Procedure: Cosmetic botulinum toxin  Pre-operative Diagnosis: Dynamic rhytides  Post-operative Diagnosis: Same  Complications:  None  Brief history: The patient desires botulinum toxin injection.  She is aware of the risks including bleeding, damage to deeper structures, asymmetry, brow ptosis, eyelid ptosis, bruising. The patient understands and wishes to proceed.  Procedure: The area was prepped with alcohol and dried with a clean gauze.  Using a clean technique the botulinum toxin was diluted with 2.5 mL of bacteriostatic saline per 100 unit vial which resulted in 4 units per 0.1 mL.  Subsequently the mixture was injected in the glabellar, lateral canthal lines, forehead area with preservation of the temporal branch to the lateral eyebrow. A total of 32 Units of botulinum toxin was used. The forehead and glabellar area was injected with care to inject intramuscular only while holding pressure on the supratrochlear vessels in each area during each injection on either side of the medial corrugators. The injection proceeded vertically superiorly to the medial 2/3 of the frontalis muscle and superior 2/3 of the lateral frontalis, again with preservation of the frontal branch.  No complications were noted. Light pressure was held for 5 minutes. She was instructed explicitly in post-operative care.  Botox LOT:  [id number] EXP:  2029/09

## 2023-06-22 ENCOUNTER — Other Ambulatory Visit: Payer: Self-pay | Admitting: Family

## 2023-06-25 ENCOUNTER — Other Ambulatory Visit (HOSPITAL_BASED_OUTPATIENT_CLINIC_OR_DEPARTMENT_OTHER): Payer: Self-pay

## 2023-06-25 ENCOUNTER — Encounter: Payer: Self-pay | Admitting: Family Medicine

## 2023-06-25 MED ORDER — DEXMETHYLPHENIDATE HCL ER 10 MG PO CP24
10.0000 mg | ORAL_CAPSULE | Freq: Every day | ORAL | 0 refills | Status: DC
  Filled 2023-06-25: qty 30, 30d supply, fill #0

## 2023-06-25 MED ORDER — DEXMETHYLPHENIDATE HCL ER 10 MG PO CP24: 10.0000 mg | ORAL_CAPSULE | Freq: Every day | ORAL | 0 refills | Status: DC

## 2023-07-03 ENCOUNTER — Encounter: Payer: Self-pay | Admitting: Dermatology

## 2023-07-12 ENCOUNTER — Encounter: Payer: 59 | Admitting: Dermatology

## 2023-07-19 ENCOUNTER — Encounter: Payer: 59 | Admitting: Dermatology

## 2023-07-19 NOTE — Telephone Encounter (Signed)
ED&C on 12/18 with JD  Mohs done on 1/22 AND 2/5 Mohs

## 2023-07-25 ENCOUNTER — Other Ambulatory Visit: Payer: Self-pay | Admitting: Family Medicine

## 2023-07-25 ENCOUNTER — Other Ambulatory Visit (HOSPITAL_BASED_OUTPATIENT_CLINIC_OR_DEPARTMENT_OTHER): Payer: Self-pay

## 2023-07-25 ENCOUNTER — Ambulatory Visit: Payer: 59 | Admitting: Dermatology

## 2023-07-25 ENCOUNTER — Encounter: Payer: Self-pay | Admitting: Dermatology

## 2023-07-25 VITALS — BP 156/101

## 2023-07-25 DIAGNOSIS — W908XXA Exposure to other nonionizing radiation, initial encounter: Secondary | ICD-10-CM

## 2023-07-25 DIAGNOSIS — L578 Other skin changes due to chronic exposure to nonionizing radiation: Secondary | ICD-10-CM | POA: Diagnosis not present

## 2023-07-25 DIAGNOSIS — Z5111 Encounter for antineoplastic chemotherapy: Secondary | ICD-10-CM | POA: Diagnosis not present

## 2023-07-25 DIAGNOSIS — D099 Carcinoma in situ, unspecified: Secondary | ICD-10-CM

## 2023-07-25 DIAGNOSIS — L570 Actinic keratosis: Secondary | ICD-10-CM | POA: Diagnosis not present

## 2023-07-25 MED ORDER — TRIAMCINOLONE ACETONIDE 0.025 % EX OINT
1.0000 | TOPICAL_OINTMENT | Freq: Two times a day (BID) | CUTANEOUS | 0 refills | Status: DC
  Filled 2023-07-25: qty 30, 15d supply, fill #0

## 2023-07-25 MED ORDER — DEXMETHYLPHENIDATE HCL ER 10 MG PO CP24
10.0000 mg | ORAL_CAPSULE | Freq: Every day | ORAL | 0 refills | Status: DC
  Filled 2023-07-25: qty 30, 30d supply, fill #0

## 2023-07-25 MED ORDER — TOLAK 4 % EX CREA: 1.0000 | TOPICAL_CREAM | Freq: Two times a day (BID) | CUTANEOUS | 0 refills | Status: DC

## 2023-07-25 NOTE — Progress Notes (Incomplete Revision)
Follow-Up Visit   Subjective  Megan Mcmillan is a 47 y.o. female who presents for the following: AK  Patient present today for follow up visit for AK's. Patient was last evaluated on 06/06/23. Bx done that resulted in skin cancers. She is scheduled with Dr. Caralyn Guile on 08/29/23 & 09/12/23. Patient denies medication changes.  The following portions of the chart were reviewed this encounter and updated as appropriate: medications, allergies, medical history  Review of Systems:  No other skin or systemic complaints except as noted in HPI or Assessment and Plan.  Objective  Well appearing patient in no apparent distress; mood and affect are within normal limits.   A focused examination was performed of the following areas: lower leg   Relevant exam findings are noted in the Assessment and Plan.    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 She has no experience with fluorouracil cream and plans to complete treatments before a trip to the [place] in March. The plan includes Mohs surgery for the anterior shin SCC, excision of the BCCs on the back, and topical fluorouracil for the superficial SCC on the right lower leg. Additionally, field therapy with fluorouracil is recommended for actinic keratoses on her face. Sun protection counseling was provided.   ACTINIC DAMAGE WITH PRECANCEROUS ACTINIC KERATOSES Counseling for Topical Chemotherapy Management: Patient exhibits: - Severe, confluent actinic changes with pre-cancerous actinic keratoses that is secondary to cumulative UV radiation exposure over time - Condition that is severe; chronic, not at goal. - diffuse scaly erythematous macules and papules with underlying dyspigmentation - Discussed Prescription "Field  Treatment" topical Chemotherapy for Severe, Chronic Confluent Actinic Changes with Pre-Cancerous Actinic Keratoses Field treatment involves treatment of an entire area of skin that has confluent Actinic Changes (Sun/ Ultraviolet light damage) and PreCancerous Actinic Keratoses by method of PhotoDynamic Therapy (PDT) and/or prescription Topical Chemotherapy agents such as 5-fluorouracil, 5-fluorouracil/calcipotriene, and/or imiquimod.  The purpose is to decrease the number of clinically evident and subclinical PreCancerous lesions to prevent progression to development of skin cancer by chemically destroying early precancer changes that may or may not be visible.  It has been shown to reduce the risk of developing skin cancer in the treated area. As a result of treatment, redness, scaling, crusting, and open sores may occur during treatment course. One or more than one of these methods may be used and may have to be used several times to control, suppress and eliminate the PreCancerous changes. Discussed treatment course, expected reaction, and possible side effects. - Recommend daily broad spectrum sunscreen SPF 30+ to sun-exposed areas, reapply every 2 hours as needed.  - Staying in the shade or wearing long sleeves, sun glasses (UVA+UVB protection) and wide brim hats (4-inch brim around the entire circumference of the hat) are also recommended. - Call for new or changing lesions.   Treatment Plan: Start 5-fluorouracil cream twice a day for 14 days to affected areas including leg.  Reviewed course of treatment and expected reaction.  Patient advised to expect inflammation and crusting and advised that erosions are possible.  Patient advised to be diligent with sun protection during and after treatment. Handout with details of how to apply medication and what to expect provided. Counseled to keep medication out of reach of children and pets.  Reviewed course of treatment and expected reaction.  Patient  advised to expect inflammation and crusting and advised that erosions are possible.  Patient advised to be diligent with sun protection during and after treatment. Handout with details of how to apply medication and what to expect provided. Counseled to keep medication out of reach of children and pets.      No follow-ups on file.    Documentation: I have reviewed the above documentation for accuracy and completeness, and I agree with the above.   I, Shirron Marcha Solders, CMA, am acting as scribe for [employer], DO.   Langston Reusing, DO

## 2023-07-25 NOTE — Progress Notes (Signed)
Follow-Up Visit   Subjective  Megan Mcmillan is a 47 y.o. female who presents for the following: AK  Patient present today for follow up visit for AK's. Patient was last evaluated on 06/06/23. Bx done that resulted in skin cancers. She is scheduled with Dr. Caralyn Guile on 08/29/23 & 09/12/23. Patient denies medication changes.  The following portions of the chart were reviewed this encounter and updated as appropriate: medications, allergies, medical history  Review of Systems:  No other skin or systemic complaints except as noted in HPI or Assessment and Plan.  Objective  Well appearing patient in no apparent distress; mood and affect are within normal limits.   A focused examination was performed of the following areas: lower leg   Relevant exam findings are noted in the Assessment and Plan.    Assessment & Plan   ACTINIC DAMAGE WITH PRECANCEROUS ACTINIC KERATOSES Counseling for Topical Chemotherapy Management: Patient exhibits: - Severe, confluent actinic changes with pre-cancerous actinic keratoses that is secondary to cumulative UV radiation exposure over time - Condition that is severe; chronic, not at goal. - diffuse scaly erythematous macules and papules with underlying dyspigmentation - Discussed Prescription "Field Treatment" topical Chemotherapy for Severe, Chronic Confluent Actinic Changes with Pre-Cancerous Actinic Keratoses Field treatment involves treatment of an entire area of skin that has confluent Actinic Changes (Sun/ Ultraviolet light damage) and PreCancerous Actinic Keratoses by method of PhotoDynamic Therapy (PDT) and/or prescription Topical Chemotherapy agents such as 5-fluorouracil, 5-fluorouracil/calcipotriene, and/or imiquimod.  The purpose is to decrease the number of clinically evident and subclinical PreCancerous lesions to prevent progression to development of skin cancer by chemically destroying early precancer changes that may or may not be visible.  It has  been shown to reduce the risk of developing skin cancer in the treated area. As a result of treatment, redness, scaling, crusting, and open sores may occur during treatment course. One or more than one of these methods may be used and may have to be used several times to control, suppress and eliminate the PreCancerous changes. Discussed treatment course, expected reaction, and possible side effects. - Recommend daily broad spectrum sunscreen SPF 30+ to sun-exposed areas, reapply every 2 hours as needed.  - Staying in the shade or wearing long sleeves, sun glasses (UVA+UVB protection) and wide brim hats (4-inch brim around the entire circumference of the hat) are also recommended. - Call for new or changing lesions.   Treatment Plan: Start 5-fluorouracil cream twice a day for 14 days to affected areas including leg.  Reviewed course of treatment and expected reaction.  Patient advised to expect inflammation and crusting and advised that erosions are possible.  Patient advised to be diligent with sun protection during and after treatment. Handout with details of how to apply medication and what to expect provided. Counseled to keep medication out of reach of children and pets.  Reviewed course of treatment and expected reaction.  Patient advised to expect inflammation and crusting and advised that erosions are possible.  Patient advised to be diligent with sun protection during and after treatment. Handout with details of how to apply medication and what to expect provided. Counseled to keep medication out of reach of children and pets.      No follow-ups on file.    Documentation: I have reviewed the above documentation for accuracy and completeness, and I agree with the above.   I, Keyen Marban Marcha Solders, CMA, am acting as scribe for [employer], DO.   Langston Reusing, DO

## 2023-07-25 NOTE — Patient Instructions (Addendum)
Hello Megan Mcmillan,  Thank you for visiting [place] today. We appreciate your commitment to managing your skin health and addressing the recent findings effectively. Here is a summary of the key instructions from today's consultation:  - Mohs Surgery: Scheduled with Dr. Caralyn Guile for the squamous cell carcinoma on your anterior shin.  - Excisions: Dr. Rosanne Gutting will also perform the excision of the two basal cell carcinomas on your back.  - Medication for Right Lower Leg:   - Application: Apply Tolak (5-Fluorouracil)  cream twice daily for 3 weeks to the superficial squamous cell carcinoma.   - Post-Treatment: After treatment, allow the area to heal and monitor for flatness and absence of scaliness.  - General Skin Care:   - Healing: Use Aquaphor for healing post-treatment.   - Sunscreen: Apply mineral sunscreen containing zinc oxide and titanium dioxide every 3 hours while in the [place].  - Field Therapy for treating AK's Face:   - Treatment: Start using Tolak cream twice daily for 2 weeks starting in early to mid-January for actinic damage.   - Healing Support: Follow up with triamcinolone 0.025% cream twice daily for an additional 2 weeks to aid healing.   - Sun Protection: Avoid sun exposure and wear protective clothing and sunscreen during treatment.  - Follow-Up Appointments:   - Procedures: You will see Dr. Caralyn Guile three times for the procedures on your back and shin.    - Progress Check: Schedule a follow-up with Dr. Onalee Hua in six months to monitor progress and manage any residual issues.  Please ensure all your appointments are scheduled, especially considering your upcoming trip in March. If you have any questions or concerns before then, please do not hesitate to contact our office.  Wishing you a wonderful holiday season and a healthy new year!  Warm regards,  Dr. Langston Reusing, Dermatology     Important Information  Due to recent changes in healthcare laws, you may see  results of your pathology and/or laboratory studies on MyChart before the doctors have had a chance to review them. We understand that in some cases there may be results that are confusing or concerning to you. Please understand that not all results are received at the same time and often the doctors may need to interpret multiple results in order to provide you with the best plan of care or course of treatment. Therefore, we ask that you please give [place] 2 business days to thoroughly review all your results before contacting the office for clarification. Should we see a critical lab result, you will be contacted sooner.   If You Need Anything After Your Visit  If you have any questions or concerns for your doctor, please call our main line at [phone] If no one answers, please leave a voicemail as directed and we will return your call as soon as possible. Messages left after 4 pm will be answered the following business day.   You may also send [place] a message via MyChart. We typically respond to MyChart messages within 1-2 business days.  For prescription refills, please ask your pharmacy to contact our office. Our fax number is [phone].  If you have an urgent issue when the clinic is closed that cannot wait until the next business day, you can page your doctor at the number below.    Please note that while we do our best to be available for urgent issues outside of office hours, we are not available 24/7.   If you have an  urgent issue and are unable to reach [place], you may choose to seek medical care at your doctor's office, retail clinic, urgent care center, or emergency room.  If you have a medical emergency, please immediately call 911 or go to the emergency department. In the event of inclement weather, please call our main line at [phone] for an update on the status of any delays or closures.  Dermatology Medication Tips: Please keep the boxes that topical medications come in in  order to help keep track of the instructions about where and how to use these. Pharmacies typically print the medication instructions only on the boxes and not directly on the medication tubes.   If your medication is too expensive, please contact our office at [phone] or send [place] a message through MyChart.   We are unable to tell what your co-pay for medications will be in advance as this is different depending on your insurance coverage. However, we may be able to find a substitute medication at lower cost or fill out paperwork to get insurance to cover a needed medication.   If a prior authorization is required to get your medication covered by your insurance company, please allow [place] 1-2 business days to complete this process.  Drug prices often vary depending on where the prescription is filled and some pharmacies may offer cheaper prices.  The website www.goodrx.com contains coupons for medications through different pharmacies. The prices here do not account for what the cost may be with help from insurance (it may be cheaper with your insurance), but the website can give you the price if you did not use any insurance.  - You can print the associated coupon and take it with your prescription to the pharmacy.  - You may also stop by our office during regular business hours and pick up a GoodRx coupon card.  - If you need your prescription sent electronically to a different pharmacy, notify our office through [facility] or by phone at [phone]

## 2023-07-26 ENCOUNTER — Other Ambulatory Visit (HOSPITAL_BASED_OUTPATIENT_CLINIC_OR_DEPARTMENT_OTHER): Payer: Self-pay

## 2023-07-26 ENCOUNTER — Ambulatory Visit: Payer: 59

## 2023-07-26 ENCOUNTER — Ambulatory Visit: Payer: 59 | Admitting: Sports Medicine

## 2023-07-26 VITALS — BP 124/82 | HR 78 | Ht 65.0 in | Wt 148.0 lb

## 2023-07-26 DIAGNOSIS — M25561 Pain in right knee: Secondary | ICD-10-CM

## 2023-07-26 DIAGNOSIS — G8929 Other chronic pain: Secondary | ICD-10-CM

## 2023-07-26 DIAGNOSIS — M25461 Effusion, right knee: Secondary | ICD-10-CM | POA: Diagnosis not present

## 2023-07-26 DIAGNOSIS — M222X1 Patellofemoral disorders, right knee: Secondary | ICD-10-CM | POA: Diagnosis not present

## 2023-07-26 MED ORDER — MELOXICAM 15 MG PO TABS
15.0000 mg | ORAL_TABLET | Freq: Every day | ORAL | 0 refills | Status: DC
  Filled 2023-07-26: qty 30, 30d supply, fill #0

## 2023-07-26 NOTE — Progress Notes (Signed)
Megan Mcmillan D.[name] [address] [place] [address] Phone: [phone]   Assessment and Plan:     1. Chronic pain of right knee 2. Patellofemoral syndrome, right  -Chronic with exacerbation, subsequent visit - Consistent with recurrence patellofemoral syndrome of right knee - X-ray obtained in clinic.  My interpretation: No acute fracture or dislocation - Start meloxicam 15 mg daily x2 weeks.  If still having pain after 2 weeks, complete 3rd-week of NSAID. May use remaining NSAID as needed once daily for pain control.  Do not to use additional over-the-counter NSAIDs (ibuprofen, naproxen, Advil, Aleve) while taking prescription NSAIDs.  May use Tylenol [phone] mg 2 to 3 times a day for breakthrough pain. - Start HEP for patellofemoral syndrome - Recommend modifying activities to decrease deep knee flexion at [facility] or when physically active  15 additional minutes spent for educating Therapeutic Home Exercise Program.  This included exercises focusing on stretching, strengthening, with focus on eccentric aspects.   Long term goals include an improvement in range of motion, strength, endurance as well as avoiding reinjury. Patient's frequency would include in 1-2 times a day, 3-5 times a week for a duration of 6-12 weeks. Proper technique shown and discussed handout in great detail with ATC.  All questions were discussed and answered.    Pertinent previous records reviewed include none  Follow Up: As needed if no improvement in 4 weeks.  Could consider ultrasound versus physical therapy versus CSI versus patellar stability brace   Subjective:   I, Moenique Parris, am serving as a Neurosurgeon for Doctor Richardean Sale  Chief Complaint: Right knee pain    HPI:    05/20/21 Patient is a 47 year old female presenting with right knee pain and instability. Patient is ine when she is walking but if she goes to get up or going up steps right  foot dominant will cause pain. Patient states there feels like there is sand in her knee and a grinding feeling. Patient states that this has been going on about 4 weeks, Tuesday night she woke up from an aching pain. Patient states that she can run sometimes and other times she feels like she is going to fly off the treadmill. Patient has also experience weakness in that knee. Denies swelling, sometimes a popping clicking sound, and locates pain to under the knee cap and stays in that locations. Patient states that she has tried tizanidine and meloxicam which helped. Other than that she has take ibuprofen as needed.    07/26/23 Patient states her knee pain is a little different . She has not been able to do orange theory for 5 days. She did it today and her pain has improved. Her pain is deep under the knee cap . If she sits or lays to long she has pain. Naproxen hasn't really been helping. Its an achy throb pain. She has the most pain when sitting and at night. Hx of kneecap dislocation in high school      Relevant Historical Information: Recent right hip pain    Additional pertinent review of systems negative.   Current Outpatient Medications:    bimatoprost (LATISSE) 0.03 % ophthalmic solution, Place one drop on applicator and apply evenly along the skin of the upper eyelid at base of eyelashes once daily at bedtime; repeat procedure for second eye (use a clean applicator). Patient may choose 3 ml size per preference, Disp: 5 mL, Rfl: 12   dexmethylphenidate (  FOCALIN XR) 10 MG 24 hr capsule, Take 1 capsule (10 mg total) by mouth daily., Disp: 30 capsule, Rfl: 0   [START ON 08/24/2023] dexmethylphenidate (FOCALIN XR) 10 MG 24 hr capsule, Take 1 capsule (10 mg total) by mouth daily., Disp: 30 capsule, Rfl: 0   dexmethylphenidate (FOCALIN XR) 10 MG 24 hr capsule, Take 1 capsule (10 mg total) by mouth daily., Disp: 30 capsule, Rfl: 0   fluconazole (DIFLUCAN) 150 MG tablet, Take 1 tablet by mouth,  repeat in 72 hours if no improvement., Disp: 2 tablet, Rfl: 0   fluorouracil (EFUDEX) 5 % cream, Apply to lesions twice daily for 2 to 4 weeks (apply until inflammatory response reaches erosion stage, then stop); complete healing may not be evident for 1 to 2 months following treatment., Disp: 40 g, Rfl: 0   Fluorouracil (TOLAK) 4 % CREA, Apply 1 Application topically 2 (two) times daily. Use on affected area for 2 weeks then stop., Disp: 40 g, Rfl: 0   fluticasone (FLONASE) 50 MCG/ACT nasal spray, PLACE 2 SPRAYS INTO BOTH NOSTRILS AS NEEDED., Disp: 16 g, Rfl: 11   levonorgestrel (MIRENA, 52 MG,) 20 MCG/24HR IUD, Mirena 20 mcg/24 hours (7 yrs) 52 mg intrauterine device, Disp: , Rfl:    meloxicam (MOBIC) 15 MG tablet, Take 1 tablet (15 mg total) by mouth daily., Disp: 14 tablet, Rfl: 0   predniSONE (DELTASONE) 20 MG tablet, Take 40 mg by mouth daily for 3-5 days, then 20 mg by mouth daily for 3-5 days, Disp: 15 tablet, Rfl: 0   triamcinolone (KENALOG) 0.025 % ointment, Apply 1 Application topically 2 (two) times daily. Use for 2 weeks after Tolak treatment, Disp: 30 g, Rfl: 0   Objective:     Vitals:   07/26/23 1455  BP: 124/82  Pulse: 78  SpO2: 100%  Weight: 148 lb (67.1 kg)  Height: 5\' 5"  (1.651 m)      Body mass index is 24.63 kg/m.    Physical Exam:    General:  awake, alert oriented, no acute distress nontoxic Skin: no suspicious lesions or rashes Neuro:sensation intact and strength 5/5 with no deficits, no atrophy, normal muscle tone Psych: No signs of anxiety, depression or other mood disorder  Right knee: Crepitus present Positive J sign Positive patellar grind No swelling No deformity Neg fluid wave, joint milking ROM Flex 110, Ext 0 NTTP over the quad tendon, medial fem condyle, lat fem condyle, patella, plica, patella tendon, tibial tuberostiy, fibular head, posterior fossa, pes anserine bursa, gerdy's tubercle, medial jt line, lateral jt line Neg anterior and  posterior drawer Neg lachman Neg sag sign Negative varus stress Negative valgus stress Negative McMurray Negative Thessaly Pain over anterior knee with single-leg squat Gait normal    Electronically signed by:  Megan Mcmillan D.[name] 3:16 PM 07/26/23

## 2023-07-26 NOTE — Patient Instructions (Signed)
Knee HEP - Start meloxicam 15 mg daily x2 weeks.  If still having pain after 2 weeks, complete 3rd-week of NSAID. May use remaining NSAID as needed once daily for pain control.  Do not to use additional over-the-counter NSAIDs (ibuprofen, naproxen, Advil, Aleve) while taking prescription NSAIDs.  May use Tylenol [phone] mg 2 to 3 times a day for breakthrough pain. As needed follow up if no improvement 3-4 week follow up

## 2023-08-02 ENCOUNTER — Encounter: Payer: Self-pay | Admitting: Dermatology

## 2023-08-05 IMAGING — MG MM DIGITAL SCREENING BILAT W/ TOMO AND CAD
8 series · 9 of 24 positions shown · non-contrast
Comparison: Previous exam(s).

CLINICAL DATA: Screening.

EXAM:
DIGITAL SCREENING BILATERAL MAMMOGRAM WITH TOMOSYNTHESIS AND CAD
TECHNIQUE: Bilateral screening digital craniocaudal and mediolateral oblique
mammograms were obtained. Bilateral screening digital breast
tomosynthesis was performed. The images were evaluated with
computer-aided detection.

[L MLO synth-2D]
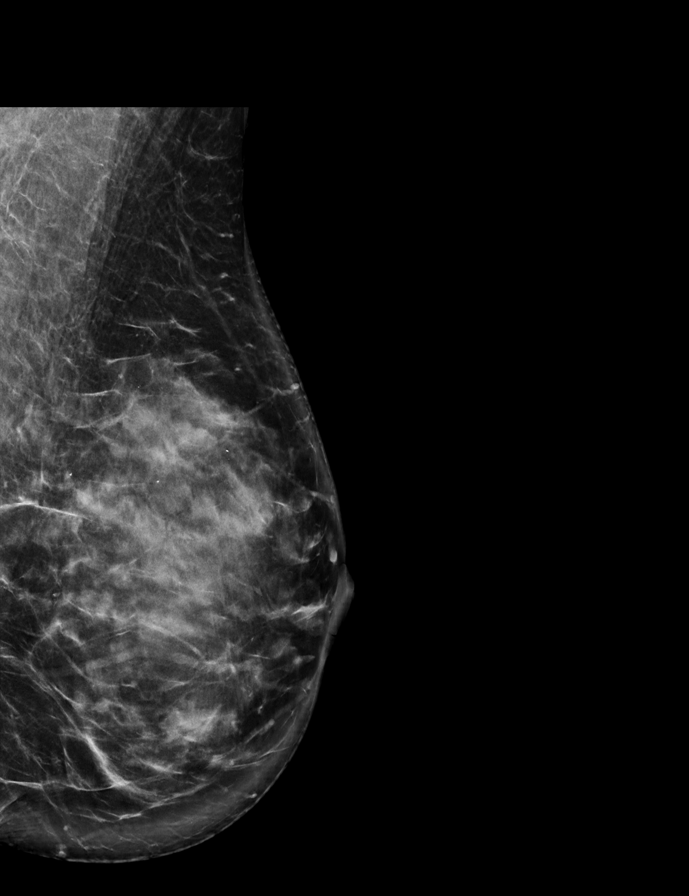

[R CC synth-2D]
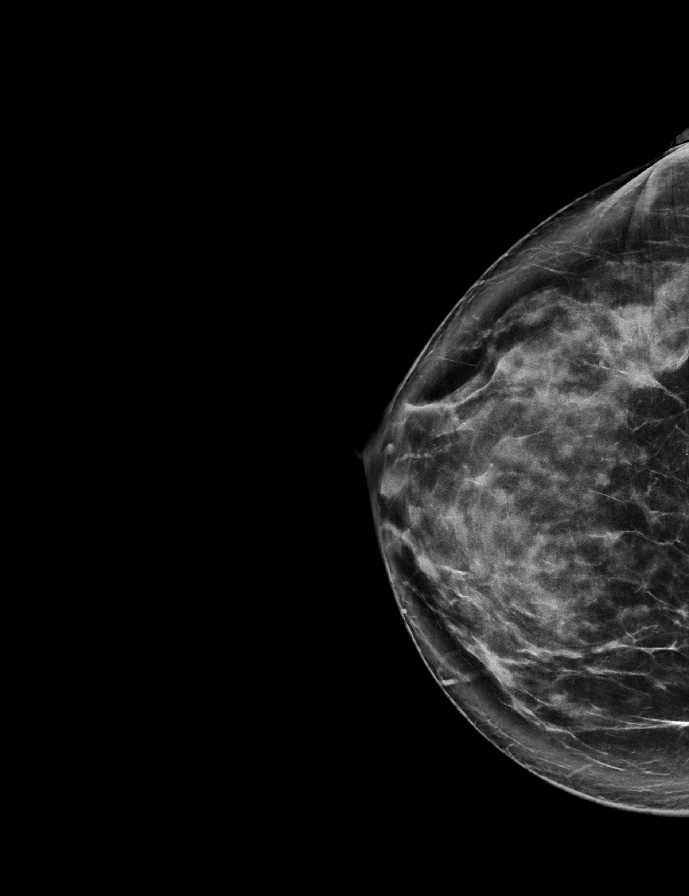

[L CC synth-2D]
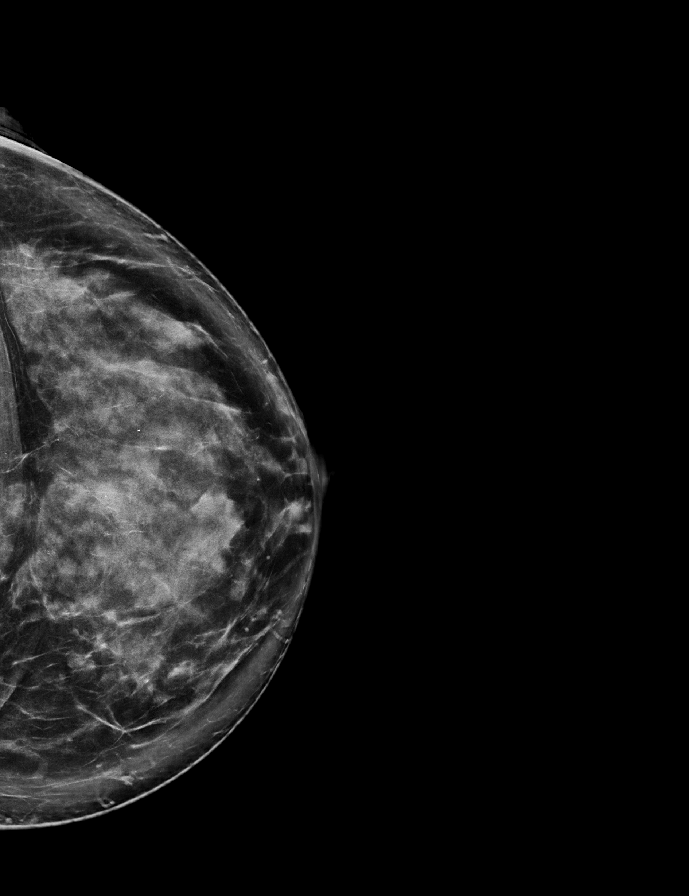

[R MLO synth-2D]
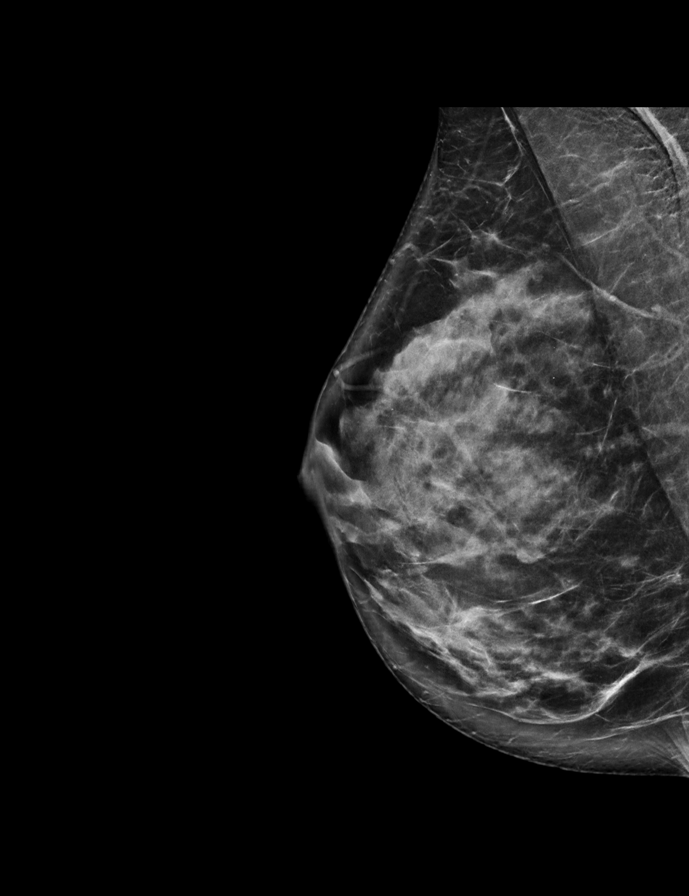

[R MLO tomo · 2 of 77 frames shown]
[frame 25/77]
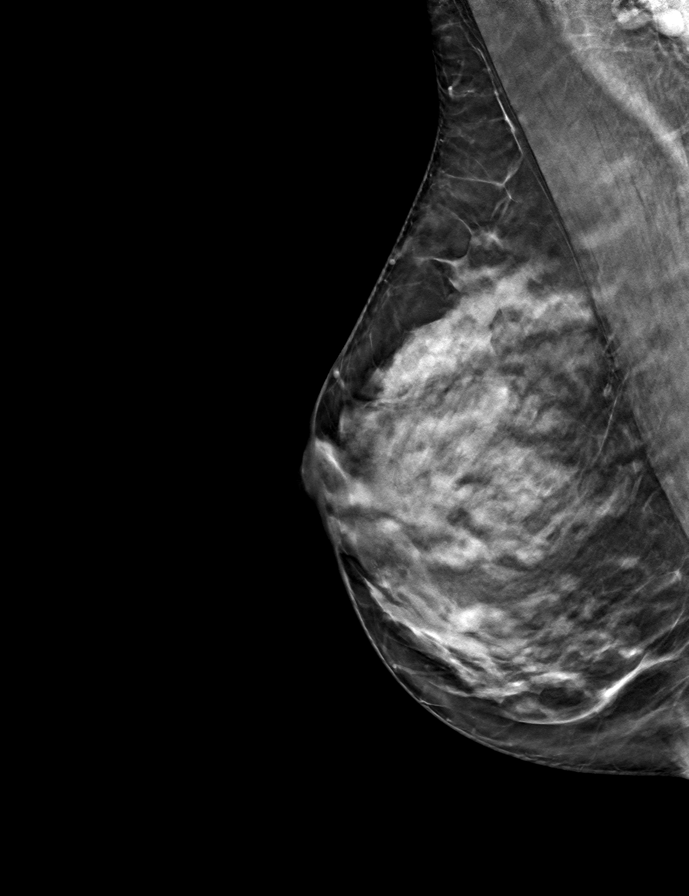
[frame 39/77]
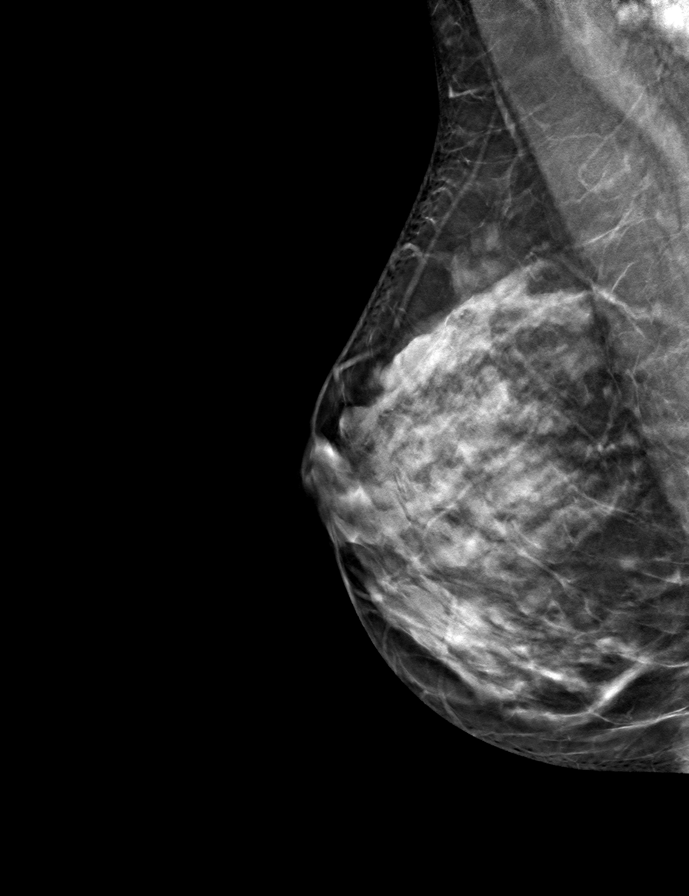

[L MLO tomo · tomo slice 38/75.0]
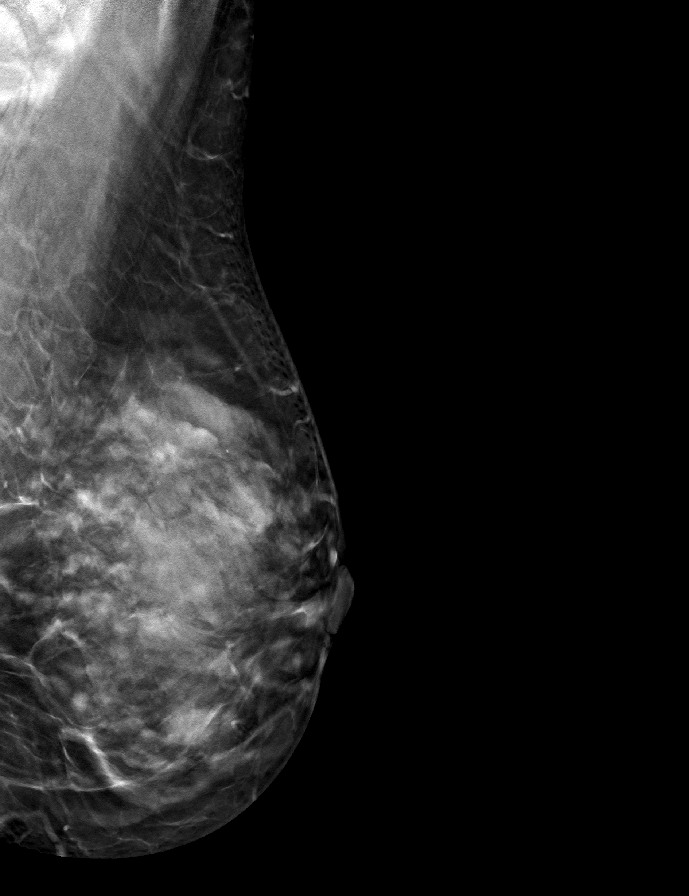

[L CC tomo · tomo slice 43/85.0]
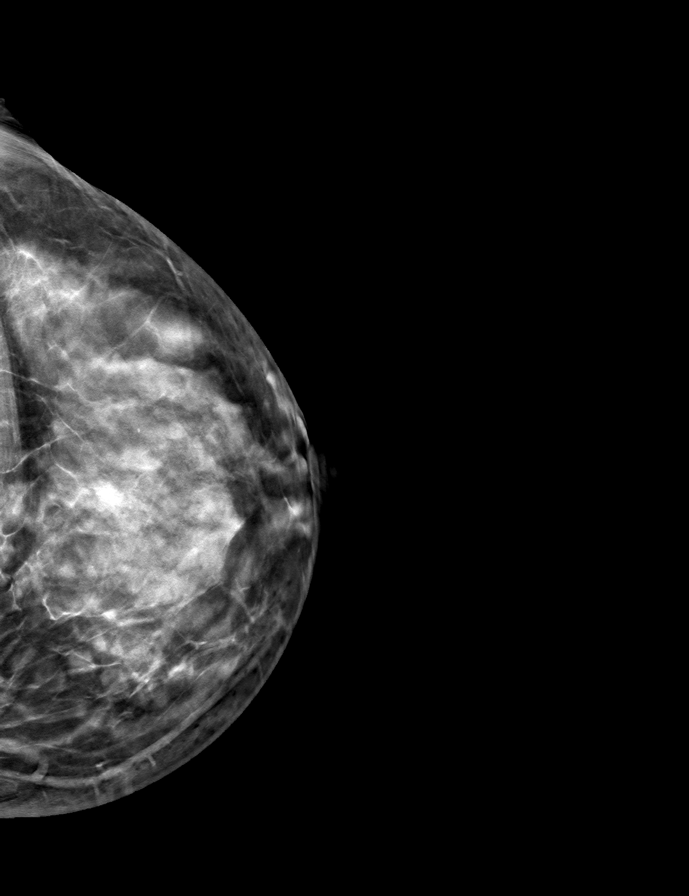

[R CC tomo · tomo slice 41/80.0]
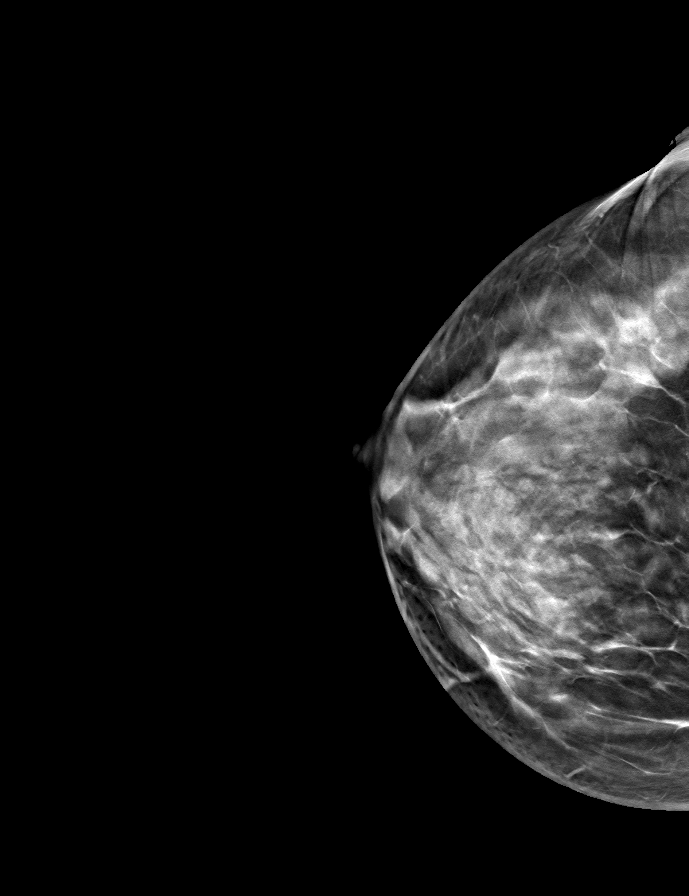

[9 of 24 positions shown; findings below may reference images not displayed]

ACR Breast Density Category c: The breast tissue is heterogeneously
dense, which may obscure small masses.
FINDINGS: There are no findings suspicious for malignancy.
IMPRESSION: No mammographic evidence of malignancy. A result letter of this
screening mammogram will be mailed directly to the patient.

RECOMMENDATION:
Screening mammogram in one year. (Code:[id number]-BC3)

BI-RADS CATEGORY  1: Negative.

## 2023-08-22 ENCOUNTER — Encounter: Payer: Self-pay | Admitting: Dermatology

## 2023-08-22 NOTE — Telephone Encounter (Signed)
 Yes, She can apply a little bit of the triamcinolone  to the affected areas on the corners of her eyes for 5 days and then switch to aquaphor daily until resolved.

## 2023-08-23 ENCOUNTER — Other Ambulatory Visit (HOSPITAL_BASED_OUTPATIENT_CLINIC_OR_DEPARTMENT_OTHER): Payer: Self-pay

## 2023-08-23 ENCOUNTER — Other Ambulatory Visit: Payer: Self-pay | Admitting: Family Medicine

## 2023-08-23 MED ORDER — DEXMETHYLPHENIDATE HCL ER 10 MG PO CP24
10.0000 mg | ORAL_CAPSULE | Freq: Every day | ORAL | 0 refills | Status: DC
  Filled 2023-08-23: qty 30, 30d supply, fill #0

## 2023-08-23 MED ORDER — DEXMETHYLPHENIDATE HCL ER 10 MG PO CP24
10.0000 mg | ORAL_CAPSULE | Freq: Every day | ORAL | 0 refills | Status: DC
  Filled 2023-08-23 – 2023-08-24 (×2): qty 30, 30d supply, fill #0

## 2023-08-23 MED ORDER — DEXMETHYLPHENIDATE HCL ER 10 MG PO CP24
10.0000 mg | ORAL_CAPSULE | Freq: Every day | ORAL | 0 refills | Status: DC
  Filled ????-??-??: fill #0

## 2023-08-24 ENCOUNTER — Other Ambulatory Visit: Payer: Self-pay

## 2023-08-24 ENCOUNTER — Other Ambulatory Visit (HOSPITAL_BASED_OUTPATIENT_CLINIC_OR_DEPARTMENT_OTHER): Payer: Self-pay

## 2023-08-28 ENCOUNTER — Encounter: Payer: Self-pay | Admitting: Dermatology

## 2023-08-29 ENCOUNTER — Encounter: Payer: 59 | Admitting: Dermatology

## 2023-08-29 ENCOUNTER — Encounter: Payer: Self-pay | Admitting: Family Medicine

## 2023-08-29 NOTE — Telephone Encounter (Signed)
Reviewed recent pathology at patient's request and discussed options with her. Fortunately her procedures have been moved up so our team won't need to pursue any intervention.

## 2023-08-30 ENCOUNTER — Encounter: Payer: Self-pay | Admitting: Dermatology

## 2023-08-30 ENCOUNTER — Ambulatory Visit (INDEPENDENT_AMBULATORY_CARE_PROVIDER_SITE_OTHER): Payer: 59 | Admitting: Dermatology

## 2023-08-30 VITALS — BP 124/82 | HR 83 | Temp 97.3°F

## 2023-08-30 DIAGNOSIS — L579 Skin changes due to chronic exposure to nonionizing radiation, unspecified: Secondary | ICD-10-CM

## 2023-08-30 DIAGNOSIS — C44729 Squamous cell carcinoma of skin of left lower limb, including hip: Secondary | ICD-10-CM | POA: Diagnosis not present

## 2023-08-30 DIAGNOSIS — L814 Other melanin hyperpigmentation: Secondary | ICD-10-CM

## 2023-08-30 DIAGNOSIS — C4492 Squamous cell carcinoma of skin, unspecified: Secondary | ICD-10-CM

## 2023-08-30 NOTE — Progress Notes (Signed)
Follow-Up Visit   Subjective  Megan Mcmillan is a 48 y.o. 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 Onalee Hua.  The following portions of the chart were reviewed this encounter and updated as appropriate: medications, allergies, medical history  Review of Systems:  No other skin or systemic complaints except as noted in HPI or Assessment and Plan.  Objective  Well appearing patient in no apparent distress; mood and affect are within normal limits.  A focused examination was performed of the following areas:  Left shin  Relevant physical exam findings are noted in the Assessment and Plan.   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 No   Was the anticoagulation regimen changed prior to Mohs? No    Procedure Details: Timeout: pre-procedure verification complete Procedure Prep: patient was prepped and draped in usual sterile fashion Prep type: chlorhexidine Biopsy accession number: [id number] Biopsy lab: [facility] Date of biopsy: 06/06/2023 Frozen section biopsy performed: No   Specimen debulked: No   Pre-Op diagnosis: squamous cell carcinoma SCC subtype: well differentiated MohsAIQ Surgical site (if tumor spans multiple areas, please select predominant area): lower limb (including hip) Surgery side: left Surgical site (from skin exam): Left shin - Anterior Pre-operative length (cm): 1.2 Pre-operative width (cm): 1.5 Indications for Mohs surgery: anatomic location where tissue conservation is critical Previously treated? No    Micrographic Surgery Details: Post-operative length (cm): 1.8 Post-operative width (cm): 1.5 Number of Mohs stages: 1 Is this a complex case (associate  members only): No    Stage 1    Tumor features identified on Mohs section: no tumor identified    Depth of defect after stage: subcutaneous fat    Perineural invasion: no perineural invasion  Patient tolerance of procedure: tolerated well, no immediate complications  Reconstruction: Was the defect reconstructed? Yes   Was reconstruction performed by the same Mohs surgeon? Yes   Setting of reconstruction: outpatient office When was reconstruction performed? same day Type of reconstruction: linear Linear reconstruction: complex Length of linear repair (cm): 5  Opioids: Did the patient receive a prescription for opioid/narcotic related to Mohs surgery? Yes   Indications for opioid/narcotics: patient required additional pain relief despite trial of non-opioid analgesia  Antibiotics: Does patient meet AHA guidelines for endocarditis?: No   Does patient meet AHA guidelines for orthopedic prophylaxis?: No   Were antibiotics given on the day of surgery?: No   Did surgery breach mucosa, expose cartilage/bone, involve an area of lymphedema/inflamed/infected tissue? No    Skin repair Complexity:  Complex Final length (cm):  5 Informed consent: discussed and consent obtained   Timeout: patient name, date of birth, surgical site, and procedure verified   Procedure prep:  Patient was prepped and draped in usual sterile fashion Prep type:  Chlorhexidine Anesthesia: the lesion was anesthetized in a standard fashion   Anesthetic:  1% lidocaine w/ epinephrine 1-100,000 buffered w/ 8.4% NaHCO3 Reason for type of repair: reduce tension to allow closure and preserve normal anatomy   Undermining: edges undermined   Subcutaneous layers (deep stitches):  Suture size:  4-0 Suture type: Vicryl (polyglactin 910)   Stitches:  Buried vertical mattress Fine/surface layer approximation (top stitches):  Suture size:  5-0 Suture type: fast-absorbing plain gut  Stitches: simple running   Suture removal  (days):  0 Hemostasis achieved with: suture, pressure and electrodesiccation Outcome: patient tolerated procedure well with no complications   Post-procedure details: sterile dressing applied and wound care instructions given   Dressing type: petrolatum and pressure dressing   SCCA (SQUAMOUS CELL CARCINOMA) OF SKIN     Return in about 2 weeks (around 09/13/2023).  Dominga Ferry, Surg Tech III, am acting as scribe for Gwenith Daily, MD.    08/30/2023  HISTORY OF PRESENT ILLNESS  Megan Mcmillan is seen in consultation at the request of Dr. 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 They note that the area has been present for about 6 months increasing in size with time.  There is no history of previous treatment.  Reports no other new or changing lesions and has no other complaints today.  Medications and allergies: see patient chart.  Review of systems: Reviewed 8 systems and notable for the above skin cancer.  All other systems reviewed are unremarkable/negative, unless noted in the HPI. Past medical history, surgical history, family history, social history were also reviewed and are noted in the chart/questionnaire.    PHYSICAL EXAMINATION  General: Well-appearing, in no acute distress, alert and oriented x 4. Vitals reviewed in chart (if available).   Skin: Exam reveals a 1.2 x 1.5 cm erythematous papule and biopsy scar on the left shin. There are rhytids, telangiectasias, and lentigines, consistent with photodamage.  Biopsy report(s) reviewed, confirming the diagnosis.   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 Due to location, size, histology, or recurrence and the likelihood of subclinical extension as well as the need to conserve normal surrounding tissue, the patient was deemed acceptable for Mohs micrographic surgery (MMS).  The nature and  purpose of the procedure, associated benefits and risks including recurrence and scarring, possible complications such as pain, infection, and bleeding, and alternative methods of treatment if appropriate were discussed with the patient during consent. The lesion location was verified by the patient, by reviewing previous notes, pathology reports, and by photographs as well as angulation measurements if available.  Informed consent was reviewed and signed by the patient, and timeout was performed at 9:00 AM. See op note below.  2. For the photodamage and solar lentigines, sun protection discussed/information given on OTC sunscreens, and we recommend continued regular follow-up with primary dermatologist every 6 months or sooner for any growing, bleeding, or changing lesions. 3. Prognosis and future surveillance discussed. 4. Letter with treatment outcome sent to referring provider. 5. Pain acetaminophen/ibuprofen  MOHS MICROGRAPHIC SURGERY AND RECONSTRUCTION  Initial size:   1.2 x 1.5 cm Surgical defect/wound size: 1.8 x 1.5 cm Anesthesia:    0.33% lidocaine with 1:200,000 epinephrine EBL:    <5 mL Complications:  None Repair type:   Complex SQ suture:   4-0 Vicryl Cutaneous suture:  5-0 Fast Absorbing Gut Final size of the repair: 5.0 cm  Stages: 1  STAGE I: Anesthesia achieved with 0.5% lidocaine with 1:200,000 epinephrine. ChloraPrep applied. 1 section(s) excised using Mohs technique (this includes total peripheral and deep tissue margin excision and evaluation with frozen sections, excised and interpreted by the same physician). The tumor was first debulked and then excised with an approx. 2mm margin.  Hemostasis was achieved with electrocautery as needed.  The specimen was then oriented, subdivided/relaxed, inked, and processed using  Mohs technique.    Frozen section analysis revealed a clear deep and peripheral margin.  Reconstruction  The surgical wound was then cleaned, prepped, and  re-anesthetized as above. Wound edges were undermined extensively along at least one entire edge and at a distance equal to or greater than the width of the defect (see wound defect size above) in order to achieve closure and decrease wound tension and anatomic distortion. Redundant tissue repair including standing cone removal was performed. Hemostasis was achieved with electrocautery. Subcutaneous and epidermal tissues were approximated with the above sutures. The surgical site was then lightly scrubbed with sterile, saline-soaked gauze. The area was then bandaged using Vaseline ointment, non-adherent gauze, gauze pads, and tape to provide an adequate pressure dressing. The patient tolerated the procedure well, was given detailed written and verbal wound care instructions, and was discharged in good condition.   The patient will follow-up: 2 weeks.   Documentation: I have reviewed the above documentation for accuracy and completeness, and I agree with the above.  Gwenith Daily, MD

## 2023-08-30 NOTE — Patient Instructions (Signed)
 Wound Care Instructions for After Surgery  On the day following your surgery, you should begin doing daily dressing changes until your sutures are removed: Remove the bandage. Cleanse the wound gently with soap and water.  Make sure you then dry the skin surrounding the wound completely or the tape will not stick to the skin. Do not use cotton balls on the wound. After the wound is clean and dry, apply the ointment (either prescription antibiotic prescribed by your doctor or plain Vaseline if nothing was prescribed) gently with a Q-tip. If you are using a bandaid to cover: Apply a bandaid large enough to cover the entire wound. If you do not have a bandaid large enough to cover the wound OR if you are sensitive to bandaid adhesive: Cut a non-stick pad (such as Telfa) to fit the size of the wound.  Cover the wound with the non-stick pad. If the wound is draining, you may want to add a small amount of gauze on top of the non-stick pad for a little added compression to the area. Use tape to seal the area completely.  For the next 1-2 weeks: Be sure to keep the wound moist with ointment 24/7 to ensure best healing. If you are unable to cover the wound with a bandage to hold the ointment in place, you may need to reapply the ointment several times a day. Do not bend over or lift heavy items to reduce the chance of elevated blood pressure to the wound. Do not participate in particularly strenuous activities.  Below is a list of dressing supplies you might need.  Cotton-tipped applicators - Q-tips Gauze pads (2x2 and/or 4x4) - All-Purpose Sponges New and clean tube of petroleum jelly (Vaseline) OR prescription antibiotic ointment if prescribed Either a bandaid large enough to cover the entire wound OR non-stick dressing material (Telfa) and Tape (Paper or Hypafix)  FOR ADULT SURGERY PATIENTS: If you need something for pain relief, you may take 1 extra strength Tylenol (acetaminophen) and 2  ibuprofen (200 mg) together every 4 hours as needed. (Do not take these medications if you are allergic to them or if you know you cannot take them for any other reason). Typically you may only need pain medication for 1-3 days.   Comments on the Post-Operative Period Slight swelling and redness often appear around the wound. This is normal and will disappear within several days following the surgery. The healing wound will drain a brownish-red-yellow discharge during healing. This is a normal phase of wound healing. As the wound begins to heal, the drainage may increase in amount. Again, this drainage is normal. Notify us if the drainage becomes persistently bloody, excessively swollen, or intensely painful or develops a foul odor or red streaks.  The healing wound will also typically be itchy. This is normal. If you have severe or persistent pain, Notify us if the discomfort is severe or persistent. Avoid alcoholic beverages when taking pain medicine.  In Case of Wound Hemorrhage A wound hemorrhage is when the bandage suddenly becomes soaked with bright red blood and flows profusely. If this happens, sit down or lie down with your head elevated. If the wound has a dressing on it, do not remove the dressing. Apply pressure to the existing gauze. If the wound is not covered, use a gauze pad to apply pressure and continue applying the pressure for 20 minutes without peeking. DO NOT COVER THE WOUND WITH A LARGE TOWEL OR WASH CLOTH. Release your hand from the  wound site but do not remove the dressing. If the bleeding has stopped, gently clean around the wound. Leave the dressing in place for 24 hours if possible. This wait time allows the blood vessels to close off so that you do not spark a new round of bleeding by disrupting the newly clotted blood vessels with an immediate dressing change. If the bleeding does not subside, continue to hold pressure for 40 minutes. If bleeding continues, page your  physician, contact an After Hours clinic or go to the Emergency Room.    Important Information  Due to recent changes in healthcare laws, you may see results of your pathology and/or laboratory studies on MyChart before the doctors have had a chance to review them. We understand that in some cases there may be results that are confusing or concerning to you. Please understand that not all results are received at the same time and often the doctors may need to interpret multiple results in order to provide you with the best plan of care or course of treatment. Therefore, we ask that you please give [place] 2 business days to thoroughly review all your results before contacting the office for clarification. Should we see a critical lab result, you will be contacted sooner.   If You Need Anything After Your Visit  If you have any questions or concerns for your doctor, please call our main line at [phone] If no one answers, please leave a voicemail as directed and we will return your call as soon as possible. Messages left after 4 pm will be answered the following business day.   You may also send [place] a message via MyChart. We typically respond to MyChart messages within 1-2 business days.  For prescription refills, please ask your pharmacy to contact our office. Our fax number is [phone].  If you have an urgent issue when the clinic is closed that cannot wait until the next business day, you can page your doctor at the number below.    Please note that while we do our best to be available for urgent issues outside of office hours, we are not available 24/7.   If you have an urgent issue and are unable to reach [place], you may choose to seek medical care at your doctor's office, retail clinic, urgent care center, or emergency room.  If you have a medical emergency, please immediately call 911 or go to the emergency department. In the event of inclement weather, please call our main line at  [phone] for an update on the status of any delays or closures.  Dermatology Medication Tips: Please keep the boxes that topical medications come in in order to help keep track of the instructions about where and how to use these. Pharmacies typically print the medication instructions only on the boxes and not directly on the medication tubes.   If your medication is too expensive, please contact our office at [phone] or send [place] a message through MyChart.   We are unable to tell what your co-pay for medications will be in advance as this is different depending on your insurance coverage. However, we may be able to find a substitute medication at lower cost or fill out paperwork to get insurance to cover a needed medication.   If a prior authorization is required to get your medication covered by your insurance company, please allow [place] 1-2 business days to complete this process.  Drug prices often vary depending on where the prescription is filled and  some pharmacies may offer cheaper prices.  The website www.goodrx.com contains coupons for medications through different pharmacies. The prices here do not account for what the cost may be with help from insurance (it may be cheaper with your insurance), but the website can give you the price if you did not use any insurance.  - You can print the associated coupon and take it with your prescription to the pharmacy.  - You may also stop by our office during regular business hours and pick up a GoodRx coupon card.  - If you need your prescription sent electronically to a different pharmacy, notify our office through [facility] or by phone at [phone]

## 2023-09-05 ENCOUNTER — Encounter: Payer: Self-pay | Admitting: Family Medicine

## 2023-09-05 ENCOUNTER — Ambulatory Visit (INDEPENDENT_AMBULATORY_CARE_PROVIDER_SITE_OTHER): Payer: 59 | Admitting: Family Medicine

## 2023-09-05 VITALS — BP 102/70 | HR 67 | Temp 97.2°F | Ht 65.0 in | Wt 144.8 lb

## 2023-09-05 DIAGNOSIS — E538 Deficiency of other specified B group vitamins: Secondary | ICD-10-CM | POA: Diagnosis not present

## 2023-09-05 DIAGNOSIS — Z87891 Personal history of nicotine dependence: Secondary | ICD-10-CM

## 2023-09-05 DIAGNOSIS — F988 Other specified behavioral and emotional disorders with onset usually occurring in childhood and adolescence: Secondary | ICD-10-CM

## 2023-09-05 DIAGNOSIS — Z13220 Encounter for screening for lipoid disorders: Secondary | ICD-10-CM | POA: Diagnosis not present

## 2023-09-05 DIAGNOSIS — Z79899 Other long term (current) drug therapy: Secondary | ICD-10-CM

## 2023-09-05 DIAGNOSIS — Z23 Encounter for immunization: Secondary | ICD-10-CM

## 2023-09-05 DIAGNOSIS — E559 Vitamin D deficiency, unspecified: Secondary | ICD-10-CM | POA: Diagnosis not present

## 2023-09-05 DIAGNOSIS — Z130 Encounter for screening for diseases of the blood and blood-forming organs and certain disorders involving the immune mechanism: Secondary | ICD-10-CM | POA: Diagnosis not present

## 2023-09-05 DIAGNOSIS — Z0000 Encounter for general adult medical examination without abnormal findings: Secondary | ICD-10-CM

## 2023-09-05 LAB — COMPREHENSIVE METABOLIC PANEL
ALT: 25 U/L (ref 0–35)
AST: 27 U/L (ref 0–37)
Albumin: 4.5 g/dL (ref 3.5–5.2)
Alkaline Phosphatase: 59 U/L (ref 39–117)
BUN: 12 mg/dL (ref 6–23)
CO2: 30 meq/L (ref 19–32)
Calcium: 9.5 mg/dL (ref 8.4–10.5)
Chloride: 102 meq/L (ref 96–112)
Creatinine, Ser: 0.75 mg/dL (ref 0.40–1.20)
GFR: 93.73 mL/min (ref >60.00)
Glucose, Bld: 89 mg/dL (ref 70–99)
Potassium: 4.5 meq/L (ref 3.5–5.1)
Sodium: 139 meq/L (ref 135–145)
Total Bilirubin: 0.5 mg/dL (ref 0.2–1.2)
Total Protein: 7.3 g/dL (ref 6.0–8.3)

## 2023-09-05 LAB — CBC WITH DIFFERENTIAL/PLATELET
Basophils Absolute: 0 10*3/uL (ref 0.0–0.1)
Basophils Relative: 0.5 % (ref 0.0–3.0)
Eosinophils Absolute: 0 10*3/uL (ref 0.0–0.7)
Eosinophils Relative: 0.9 % (ref 0.0–5.0)
HCT: 42.7 % (ref 36.0–46.0)
Hemoglobin: 14.1 g/dL (ref 12.0–15.0)
Lymphocytes Relative: 26.4 % (ref 12.0–46.0)
Lymphs Abs: 1.4 10*3/uL (ref 0.7–4.0)
MCHC: 32.9 g/dL (ref 30.0–36.0)
MCV: 101.8 fL — ABNORMAL HIGH (ref 78.0–100.0)
Monocytes Absolute: 0.5 10*3/uL (ref 0.1–1.0)
Monocytes Relative: 8.4 % (ref 3.0–12.0)
Neutro Abs: 3.5 10*3/uL (ref 1.4–7.7)
Neutrophils Relative %: 63.8 % (ref 43.0–77.0)
Platelets: 230 10*3/uL (ref 150.0–400.0)
RBC: 4.2 Mil/uL (ref 3.87–5.11)
RDW: 13.9 % (ref 11.5–15.5)
WBC: 5.4 10*3/uL (ref 4.0–10.5)

## 2023-09-05 LAB — LIPID PANEL
Cholesterol: 177 mg/dL (ref 0–200)
HDL: 93.6 mg/dL (ref >39.00)
LDL Cholesterol: 67 mg/dL (ref 0–99)
NonHDL: 83.64
Total CHOL/HDL Ratio: 2
Triglycerides: 83 mg/dL (ref 0.0–149.0)
VLDL: 16.6 mg/dL (ref 0.0–40.0)

## 2023-09-05 LAB — VITAMIN B12: Vitamin B-12: 819 pg/mL (ref 211–911)

## 2023-09-05 LAB — VITAMIN D 25 HYDROXY (VIT D DEFICIENCY, FRACTURES): VITD: 33.38 ng/mL (ref 30.00–100.00)

## 2023-09-05 NOTE — Progress Notes (Signed)
Phone [phone]   Subjective:  Patient presents today for their annual physical. Chief complaint-noted.   See problem oriented charting- ROS- full  review of systems was completed and negative Per full ROS sheet completed by patient  The following were reviewed and entered/updated in epic: Past Medical History:  Diagnosis Date   Migraine 08/19/2014   No recurrence after birth of son. Previously on flexeril.     Squamous cell carcinoma of skin    Patient Active Problem List   Diagnosis Date Noted   Attention deficit disorder (ADD) in adult 09/05/2023    Priority: Medium    Venous insufficiency 06/11/2019    Priority: Medium    History of squamous cell carcinoma 08/19/2014    Priority: Medium    Seborrheic dermatitis 08/28/2017    Priority: Low   Actinic keratosis 06/08/2015    Priority: Low   History of cervical cancer 08/19/2014    Priority: Low   Vitamin D deficiency 08/19/2014    Priority: Low   Former smoker 08/19/2014    Priority: Low   Right knee pain 03/07/2019    Priority: 1.   Gluteal tendonitis of left buttock 11/05/2018    Priority: 1.   Tendonitis 05/31/2018    Priority: 1.   Olecranon bursitis of right elbow 05/31/2018    Priority: 1.   Weakness of right hip 03/28/2018    Priority: 1.   Acromioclavicular sprain 04/29/2015    Priority: 1.   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    VAGINAL DELIVERY  1998    Family History  Problem Relation Age of Onset   Hypertension Mother    Depression Mother        "depression in entire family"   Heart attack Father 42       smoked marijuana for many years   Hypertension Father    Depression Father    Breast cancer Maternal Grandmother        35s   Diabetes Maternal Grandmother     Dementia Maternal Grandmother    Colon cancer Neg Hx    Colon polyps Neg Hx    Esophageal cancer Neg Hx    Stomach cancer Neg Hx    Rectal cancer Neg Hx     Medications- reviewed and updated Current Outpatient Medications  Medication Sig Dispense Refill   bimatoprost (LATISSE) 0.03 % ophthalmic solution Place one drop on applicator and apply evenly along the skin of the upper eyelid at base of eyelashes once daily at bedtime; repeat procedure for second eye (use a clean applicator). Patient may choose 3 ml size per preference 5 mL 12   dexmethylphenidate (FOCALIN XR) 10 MG 24 hr capsule Take 1 capsule (10 mg total) by mouth daily. 30 capsule 0   dexmethylphenidate (FOCALIN XR) 10 MG 24 hr capsule Take 1 capsule (10 mg total) by mouth daily. 30 capsule 0   dexmethylphenidate (FOCALIN XR) 10 MG 24 hr capsule Take 1 capsule (10 mg total) by mouth daily. 30 capsule 0   fluticasone (FLONASE) 50 MCG/ACT nasal spray PLACE 2 SPRAYS INTO BOTH NOSTRILS AS NEEDED. 16 g 11   levonorgestrel (MIRENA, 52 MG,) 20 MCG/24HR IUD Mirena 20 mcg/24 hours (7 yrs) 52 mg intrauterine device  No current facility-administered medications for this visit.    Allergies-reviewed and updated Allergies  Allergen Reactions   Latex     swelling   Wellbutrin [Bupropion]     Vivid dreams/night terrors    Social History   Social History Narrative   Married 2015. Son 0[address]- also patient of Dr. Durene Cal.     Finished HS.       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 Lenord Fellers   In the past-front office, CMA, pharmacy tech, billing and coding-diverse medical background      Hobbies: football, reading, cooking, enjoys cleaning      Objective  Objective:  BP 102/70   Pulse 67   Temp (!) 97.2 F (36.2 C)   Ht 5\' 5"  (1.651 m)   Wt 144 lb 12.8 oz (65.7 kg)   SpO2 97%   BMI 24.10 kg/m  Gen: NAD, resting comfortably HEENT: Mucous membranes are moist. Oropharynx normal Neck:  no thyromegaly CV: RRR no murmurs rubs or gallops Lungs: CTAB no crackles, wheeze, rhonchi Abdomen: soft/nontender/nondistended/normal bowel sounds. No rebound or guarding.  Ext: no edema Skin: warm, dry Neuro: grossly normal, moves all extremities, PERRLA   Assessment and Plan   48 y.o. female presenting for annual physical.  Health Maintenance counseling: 1. Anticipatory guidance: Patient counseled regarding regular dental exams -q6 months, eye exams - yearly low grade prescription- just in November- considering progressives,  avoiding smoking and second hand smoke , limiting alcohol to 1 beverage per day- before the holidays 1 a day- up over the holidays- encouraged to get back down to 1 a day , no illicit drugs.   2. Risk factor reduction:  Advised patient of need for regular exercise and diet rich and fruits and vegetables to reduce risk of heart attack and stroke.  Exercise- orange theory typically- short term hiatus- planning of the restart 3x a week for 60 minutes.  Diet/weight management-weight within 0.8 lbs of last year. Cutting down on alchol can help with maintenance but she already intends to do so since after holidays Wt Readings from Last 3 Encounters:  09/05/23 144 lb 12.8 oz (65.7 kg)  07/26/23 148 lb (67.1 kg)  04/23/23 148 lb 12.8 oz (67.5 kg)  3. Immunizations/screenings/ancillary studies- Tetanus, Diphtheria, and Pertussis (Tdap) today otherwise up to date- holding on covid Immunization History  Administered Date(s) Administered   Influenza,inj,Quad PF,6+ Mos 05/17/2019, 05/12/2020, 05/21/2022, 09/05/2023   Influenza-Unspecified 05/21/2014, 05/21/2015, 05/03/2016, 04/30/2017, 05/13/2021   PFIZER(Purple Top)SARS-COV-2 Vaccination 10/03/2019, 10/17/2019, 05/21/2020   Pfizer Covid-19 Vaccine Bivalent Booster 34yrs & up 08/25/2021   Td 08/08/2013   Tdap 09/05/2023  4. Cervical cancer screening- Dr. Marcelle Overlie February 04 2022- trying to get records 5. Breast cancer screening-   breast exam with GYN and mammogram 04/03/23 6. Colon cancer screening - 12/01/20 with 10 year follow up  7. Skin cancer screening- history multiple skin cancers- recent mohs with Dr. Caralyn Guile- and 2 upcoming excisions as well. advised regular sunscreen use. Denies worrisome, changing, or new skin lesions.  8. Birth control/STD check- IUD in place - 1 more year  53. Osteoporosis screening at 45- will plan on this 10. Smoking associated screening - former smoker quit 2012- get urinalysis   Status of chronic or acute concerns   #social update- granddaughter is 10 months! Fully caught up even though premature  # attention deficit disorder- genesight testing under media S:control:  reasonable control- school stopped in December and brief pause medication:  focalin 10 mg- does not need refill yet Controlled substance contract: team will do today NCCSRS/PDMP reviewed: low risk trend Original diagnosis: [facility] February 05 2023 scanned in A/P: reasonable control- ok for now as sometimes skips weekend dose- will refill when she needs it and see each other in 6 months   #bimatoprost still on but able to get at lower cost  #Flonase- just started back for allergies  # B12 deficiency S: Current treatment/medication (oral vs. IM): on and off taking b12  Lab Results  Component Value Date   VITAMINB12 348 08/31/2021  A/P: update B12 today with labs - intermittent may be adequate  #Vitamin D deficiency S: Medication: off for now Last vitamin D Lab Results  Component Value Date   VD25OH 47.76 08/31/2021  A/P: update D with labs- may need daily supplement    Recommended follow up: Return in about 6 months (around 03/04/2024) for followup or sooner if needed.Schedule b4 you leave. Future Appointments  Date Time Provider Department Center  09/12/2023  9:45 AM Gwenith Daily, MD [facility] None  01/24/2024  9:15 AM Terri Piedra, DO [facility] None   Lab/Order associations:NOT fasting    ICD-10-CM   1. Preventative health care  Z00.00     2. Need for Tdap vaccination  Z23 Tdap vaccine greater than or equal to 7yo IM    3. Attention deficit disorder (ADD) in adult  F98.8 DRUG MONITORING, PANEL 8 WITH CONFIRMATION, URINE    4. B12 deficiency  E53.8 Vitamin B12    5. Vitamin D deficiency  E55.9 VITAMIN D 25 Hydroxy (Vit-D Deficiency, Fractures)    6. Former smoker  Z87.891 Urinalysis, Routine w reflex microscopic    7. High risk medication use  Z79.899 DRUG MONITORING, PANEL 8 WITH CONFIRMATION, URINE    8. Screening for hyperlipidemia  Z13.220 Comprehensive metabolic panel    Lipid panel    9. Screening for deficiency anemia  Z13.0 CBC with Differential/Platelet      No orders of the defined types were placed in this encounter.   Return precautions advised.  Tana Conch, MD

## 2023-09-05 NOTE — Patient Instructions (Addendum)
Team find pap under media I believe  Controlled substance today  Please stop by lab before you go If you have mychart- we will send your results within 3 business days of [place] receiving them.  If you do not have mychart- we will call you about results within 5 business days of [place] receiving them.  *please also note that you will see labs on mychart as soon as they post. I will later go in and write notes on them- will say "notes from Dr. Durene Cal"   Recommended follow up: Return in about 6 months (around 03/04/2024) for followup or sooner if needed.Schedule b4 you leave.

## 2023-09-06 ENCOUNTER — Encounter: Payer: 59 | Admitting: Dermatology

## 2023-09-06 ENCOUNTER — Encounter: Payer: Self-pay | Admitting: Family Medicine

## 2023-09-06 ENCOUNTER — Other Ambulatory Visit: Payer: Self-pay

## 2023-09-06 ENCOUNTER — Encounter: Payer: Self-pay | Admitting: Dermatology

## 2023-09-06 ENCOUNTER — Other Ambulatory Visit: Payer: 59

## 2023-09-06 DIAGNOSIS — Z87891 Personal history of nicotine dependence: Secondary | ICD-10-CM

## 2023-09-06 DIAGNOSIS — Z79899 Other long term (current) drug therapy: Secondary | ICD-10-CM

## 2023-09-06 LAB — URINALYSIS, ROUTINE W REFLEX MICROSCOPIC
Bilirubin Urine: NEGATIVE
Hgb urine dipstick: NEGATIVE
Ketones, ur: NEGATIVE
Leukocytes,Ua: NEGATIVE
Nitrite: NEGATIVE
RBC / HPF: NONE SEEN (ref 0–?)
Specific Gravity, Urine: 1.02 (ref 1.000–1.030)
Total Protein, Urine: NEGATIVE
Urine Glucose: NEGATIVE
Urobilinogen, UA: 0.2 (ref 0.0–1.0)
pH: 6 (ref 5.0–8.0)

## 2023-09-08 LAB — DM TEMPLATE

## 2023-09-08 LAB — DRUG MONITORING, PANEL 8 WITH CONFIRMATION, URINE
6 Acetylmorphine: NEGATIVE ng/mL (ref <10)
Alcohol Metabolites: POSITIVE ng/mL — AB (ref <500)
Amphetamines: NEGATIVE ng/mL (ref <500)
Benzodiazepines: NEGATIVE ng/mL (ref <100)
Buprenorphine, Urine: NEGATIVE ng/mL (ref <5)
Cocaine Metabolite: NEGATIVE ng/mL (ref <150)
Creatinine: 92 mg/dL (ref >=20.0)
Ethyl Glucuronide (ETG): >100000 ng/mL — ABNORMAL HIGH (ref <500)
Ethyl Sulfate (ETS): 26503 ng/mL — ABNORMAL HIGH (ref <100)
MDMA: NEGATIVE ng/mL (ref <500)
Marijuana Metabolite: NEGATIVE ng/mL (ref <20)
Opiates: NEGATIVE ng/mL (ref <100)
Oxidant: NEGATIVE ug/mL (ref <200)
Oxycodone: NEGATIVE ng/mL (ref <100)
pH: 6.1 (ref 4.5–9.0)

## 2023-09-11 ENCOUNTER — Encounter: Payer: Self-pay | Admitting: Dermatology

## 2023-09-12 ENCOUNTER — Ambulatory Visit (INDEPENDENT_AMBULATORY_CARE_PROVIDER_SITE_OTHER): Payer: 59 | Admitting: Dermatology

## 2023-09-12 ENCOUNTER — Encounter: Payer: Self-pay | Admitting: Dermatology

## 2023-09-12 VITALS — BP 132/84 | HR 81

## 2023-09-12 DIAGNOSIS — C44519 Basal cell carcinoma of skin of other part of trunk: Secondary | ICD-10-CM

## 2023-09-12 DIAGNOSIS — Z85828 Personal history of other malignant neoplasm of skin: Secondary | ICD-10-CM

## 2023-09-12 DIAGNOSIS — L905 Scar conditions and fibrosis of skin: Secondary | ICD-10-CM | POA: Diagnosis not present

## 2023-09-12 DIAGNOSIS — L988 Other specified disorders of the skin and subcutaneous tissue: Secondary | ICD-10-CM | POA: Diagnosis not present

## 2023-09-12 NOTE — Patient Instructions (Addendum)
 Wound Care Instructions for After Surgery  On the day following your surgery, you should begin doing daily dressing changes until your sutures are removed: Remove the bandage. Cleanse the wound gently with soap and water.  Make sure you then dry the skin surrounding the wound completely or the tape will not stick to the skin. Do not use cotton balls on the wound. After the wound is clean and dry, apply the ointment (either prescription antibiotic prescribed by your doctor or plain Vaseline if nothing was prescribed) gently with a Q-tip. If you are using a bandaid to cover: Apply a bandaid large enough to cover the entire wound. If you do not have a bandaid large enough to cover the wound OR if you are sensitive to bandaid adhesive: Cut a non-stick pad (such as Telfa) to fit the size of the wound.  Cover the wound with the non-stick pad. If the wound is draining, you may want to add a small amount of gauze on top of the non-stick pad for a little added compression to the area. Use tape to seal the area completely.  For the next 1-2 weeks: Be sure to keep the wound moist with ointment 24/7 to ensure best healing. If you are unable to cover the wound with a bandage to hold the ointment in place, you may need to reapply the ointment several times a day. Do not bend over or lift heavy items to reduce the chance of elevated blood pressure to the wound. Do not participate in particularly strenuous activities.  Below is a list of dressing supplies you might need.  Cotton-tipped applicators - Q-tips Gauze pads (2x2 and/or 4x4) - All-Purpose Sponges New and clean tube of petroleum jelly (Vaseline) OR prescription antibiotic ointment if prescribed Either a bandaid large enough to cover the entire wound OR non-stick dressing material (Telfa) and Tape (Paper or Hypafix)  FOR ADULT SURGERY PATIENTS: If you need something for pain relief, you may take 1 extra strength Tylenol (acetaminophen) and 2  ibuprofen (200 mg) together every 4 hours as needed. (Do not take these medications if you are allergic to them or if you know you cannot take them for any other reason). Typically you may only need pain medication for 1-3 days.   Comments on the Post-Operative Period Slight swelling and redness often appear around the wound. This is normal and will disappear within several days following the surgery. The healing wound will drain a brownish-red-yellow discharge during healing. This is a normal phase of wound healing. As the wound begins to heal, the drainage may increase in amount. Again, this drainage is normal. Notify us  if the drainage becomes persistently bloody, excessively swollen, or intensely painful or develops a foul odor or red streaks.  The healing wound will also typically be itchy. This is normal. If you have severe or persistent pain, Notify us  if the discomfort is severe or persistent. Avoid alcoholic beverages when taking pain medicine.  In Case of Wound Hemorrhage A wound hemorrhage is when the bandage suddenly becomes soaked with bright red blood and flows profusely. If this happens, sit down or lie down with your head elevated. If the wound has a dressing on it, do not remove the dressing. Apply pressure to the existing gauze. If the wound is not covered, use a gauze pad to apply pressure and continue applying the pressure for 20 minutes without peeking. DO NOT COVER THE WOUND WITH A LARGE TOWEL OR WASH CLOTH. Release your hand from the  wound site but do not remove the dressing. If the bleeding has stopped, gently clean around the wound. Leave the dressing in place for 24 hours if possible. This wait time allows the blood vessels to close off so that you do not spark a new round of bleeding by disrupting the newly clotted blood vessels with an immediate dressing change. If the bleeding does not subside, continue to hold pressure for 40 minutes. If bleeding continues, page your  physician, contact an After Hours clinic or go to the Emergency Room.    Important Information  Due to recent changes in healthcare laws, you may see results of your pathology and/or laboratory studies on MyChart before the doctors have had a chance to review them. We understand that in some cases there may be results that are confusing or concerning to you. Please understand that not all results are received at the same time and often the doctors may need to interpret multiple results in order to provide you with the best plan of care or course of treatment. Therefore, we ask that you please give us  2 business days to thoroughly review all your results before contacting the office for clarification. Should we see a critical lab result, you will be contacted sooner.   If You Need Anything After Your Visit  If you have any questions or concerns for your doctor, please call our main line at [phone] If no one answers, please leave a voicemail as directed and we will return your call as soon as possible. Messages left after 4 pm will be answered the following business day.   You may also send us  a message via MyChart. We typically respond to MyChart messages within 1-2 business days.  For prescription refills, please ask your pharmacy to contact our office. Our fax number is [phone].  If you have an urgent issue when the clinic is closed that cannot wait until the next business day, you can page your doctor at the number below.    Please note that while we do our best to be available for urgent issues outside of office hours, we are not available 24/7.   If you have an urgent issue and are unable to reach us , you may choose to seek medical care at your doctor's office, retail clinic, urgent care center, or emergency room.  If you have a medical emergency, please immediately call 911 or go to the emergency department. In the event of inclement weather, please call our main line at  [phone] for an update on the status of any delays or closures.  Dermatology Medication Tips: Please keep the boxes that topical medications come in in order to help keep track of the instructions about where and how to use these. Pharmacies typically print the medication instructions only on the boxes and not directly on the medication tubes.   If your medication is too expensive, please contact our office at [phone] or send us  a message through MyChart.   We are unable to tell what your co-pay for medications will be in advance as this is different depending on your insurance coverage. However, we may be able to find a substitute medication at lower cost or fill out paperwork to get insurance to cover a needed medication.   If a prior authorization is required to get your medication covered by your insurance company, please allow us  1-2 business days to complete this process.  Drug prices often vary depending on where the prescription is filled and  some pharmacies may offer cheaper prices.  The website www.goodrx.com contains coupons for medications through different pharmacies. The prices here do not account for what the cost may be with help from insurance (it may be cheaper with your insurance), but the website can give you the price if you did not use any insurance.  - You can print the associated coupon and take it with your prescription to the pharmacy.  - You may also stop by our office during regular business hours and pick up a GoodRx coupon card.  - If you need your prescription sent electronically to a different pharmacy, notify our office through [facility] or by phone at [phone]    Wound Care Instructions for After Surgery  On the day following your surgery, you should begin doing daily dressing changes until your sutures are removed: Remove the bandage. Cleanse the wound gently with soap and water.  Make sure you then dry the skin surrounding the wound  completely or the tape will not stick to the skin. Do not use cotton balls on the wound. After the wound is clean and dry, apply the ointment (either prescription antibiotic prescribed by your doctor or plain Vaseline if nothing was prescribed) gently with a Q-tip. If you are using a bandaid to cover: Apply a bandaid large enough to cover the entire wound. If you do not have a bandaid large enough to cover the wound OR if you are sensitive to bandaid adhesive: Cut a non-stick pad (such as Telfa) to fit the size of the wound.  Cover the wound with the non-stick pad. If the wound is draining, you may want to add a small amount of gauze on top of the non-stick pad for a little added compression to the area. Use tape to seal the area completely.  For the next 1-2 weeks: Be sure to keep the wound moist with ointment 24/7 to ensure best healing. If you are unable to cover the wound with a bandage to hold the ointment in place, you may need to reapply the ointment several times a day. Do not bend over or lift heavy items to reduce the chance of elevated blood pressure to the wound. Do not participate in particularly strenuous activities.  Below is a list of dressing supplies you might need.  Cotton-tipped applicators - Q-tips Gauze pads (2x2 and/or 4x4) - All-Purpose Sponges New and clean tube of petroleum jelly (Vaseline) OR prescription antibiotic ointment if prescribed Either a bandaid large enough to cover the entire wound OR non-stick dressing material (Telfa) and Tape (Paper or Hypafix)  FOR ADULT SURGERY PATIENTS: If you need something for pain relief, you may take 1 extra strength Tylenol (acetaminophen) and 2 ibuprofen (200 mg) together every 4 hours as needed. (Do not take these medications if you are allergic to them or if you know you cannot take them for any other reason). Typically you may only need pain medication for 1-3 days.   Comments on the Post-Operative Period Slight swelling  and redness often appear around the wound. This is normal and will disappear within several days following the surgery. The healing wound will drain a brownish-red-yellow discharge during healing. This is a normal phase of wound healing. As the wound begins to heal, the drainage may increase in amount. Again, this drainage is normal. Notify us  if the drainage becomes persistently bloody, excessively swollen, or intensely painful or develops a foul odor or red streaks.  The healing wound will also typically be  itchy. This is normal. If you have severe or persistent pain, Notify us  if the discomfort is severe or persistent. Avoid alcoholic beverages when taking pain medicine.  In Case of Wound Hemorrhage A wound hemorrhage is when the bandage suddenly becomes soaked with bright red blood and flows profusely. If this happens, sit down or lie down with your head elevated. If the wound has a dressing on it, do not remove the dressing. Apply pressure to the existing gauze. If the wound is not covered, use a gauze pad to apply pressure and continue applying the pressure for 20 minutes without peeking. DO NOT COVER THE WOUND WITH A LARGE TOWEL OR WASH CLOTH. Release your hand from the wound site but do not remove the dressing. If the bleeding has stopped, gently clean around the wound. Leave the dressing in place for 24 hours if possible. This wait time allows the blood vessels to close off so that you do not spark a new round of bleeding by disrupting the newly clotted blood vessels with an immediate dressing change. If the bleeding does not subside, continue to hold pressure for 40 minutes. If bleeding continues, page your physician, contact an After Hours clinic or go to the Emergency Room.

## 2023-09-12 NOTE — Progress Notes (Signed)
 Follow-Up Visit   Subjective  Megan Mcmillan is a 48 y.o. female who presents for the following: excision for a superficial and nodular BCC on the left lower back, biopsied by Dr. Alm.   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 She is still putting ointment on the wound.   The following portions of the chart were reviewed this encounter and updated as appropriate: medications, allergies, medical history  Review of Systems:  No other skin or systemic complaints except as noted in HPI or Assessment and Plan.  Objective  Well appearing patient in no apparent distress; mood and affect are within normal limits.  A focused examination was performed of the following areas:  Left lower back  Relevant physical exam findings are noted in the Assessment and Plan.        Left Lower Back Healing biopsy site  Assessment & Plan   BASAL CELL CARCINOMA (BCC) OF SKIN OF OTHER PART OF [place] Left Lower Back Skin excision  Excision method:  elliptical Informed consent: discussed and consent obtained   Timeout: patient name, date of birth, surgical site, and procedure verified   Procedure prep:  Patient was prepped and draped in usual sterile fashion Prep type:  Chlorhexidine Anesthesia: the lesion was anesthetized in a standard fashion   Anesthetic:  1% lidocaine w/ epinephrine  1-100,000 buffered w/ 8.4% NaHCO3 Instrument used: #15 blade   Hemostasis achieved with: suture, pressure and electrodesiccation   Outcome: patient tolerated procedure well with no complications   Post-procedure details: sterile dressing applied and wound care instructions given   Dressing type: petrolatum, bandage and pressure dressing    Skin repair Complexity:  Complex Informed consent: discussed and consent obtained   Timeout: patient name, date of birth, surgical site, and procedure verified   Procedure prep:  Patient was prepped and draped in usual sterile fashion Prep  type:  Chlorhexidine Anesthesia: the lesion was anesthetized in a standard fashion   Anesthetic:  1% lidocaine w/ epinephrine  1-100,000 buffered w/ 8.4% NaHCO3 Reason for type of repair: reduce tension to allow closure and preserve normal anatomy   Undermining: edges undermined   Subcutaneous layers (deep stitches):  Suture size:  3-0 Suture type: PDS (polydioxanone)   Stitches:  Buried vertical mattress Fine/surface layer approximation (top stitches):  Suture type: cyanoacrylate tissue glue   Hemostasis achieved with: suture, pressure and electrodesiccation Outcome: patient tolerated procedure well with no complications   Post-procedure details: sterile dressing applied and wound care instructions given   Dressing type: petrolatum, bandage and pressure dressing    The surgical wound was then cleaned, prepped, and re-anesthetized as above. Wound edges were undermined extensively along at least one entire edge and at a distance equal to or greater than the width of the defect (see wound defect size above) in order to achieve closure and decrease wound tension and anatomic distortion. Redundant tissue repair including standing cone removal was performed. Hemostasis was achieved with electrocautery. Subcutaneous and epidermal tissues were approximated with the above sutures. The surgical site was then lightly scrubbed with sterile, saline-soaked gauze. Steri-strips were applied, and the area was then bandaged using Vaseline ointment, non-adherent gauze, gauze pads, and tape to provide an adequate pressure dressing. The patient tolerated the procedure well, was given detailed written and verbal wound care instructions, and was discharged in good condition.   The patient will follow-up: 4-6 weeks.  Scar s/p Mohs for SCC on the left shin,  treated on 08/30/2023, repaired with linear closure - Reassured that wound has healed well - Discussed that scars take up to 12 months to mature from the date of  surgery - Recommend SPF 30+ to scar daily to prevent purple color - OK to start scar massage at 4-6 weeks post-op - Can consider silicone based products for scar healing - Continue ointment under occlusion for another 10 days   Return in about 2 weeks (around 09/26/2023) for excision of BCC on back.  LILLETTE Berwyn Baseman, Surg Tech III, am acting as scribe for RUFUS CHRISTELLA HOLY, MD.   Documentation: I have reviewed the above documentation for accuracy and completeness, and I agree with the above.  RUFUS CHRISTELLA HOLY, MD

## 2023-09-14 LAB — SURGICAL PATHOLOGY

## 2023-09-27 ENCOUNTER — Encounter: Payer: 59 | Admitting: Surgical

## 2023-10-01 ENCOUNTER — Other Ambulatory Visit: Payer: Self-pay

## 2023-10-01 ENCOUNTER — Other Ambulatory Visit: Payer: Self-pay | Admitting: Family Medicine

## 2023-10-01 MED ORDER — DEXMETHYLPHENIDATE HCL ER 10 MG PO CP24
10.0000 mg | ORAL_CAPSULE | Freq: Every day | ORAL | 0 refills | Status: DC
  Filled 2023-10-01: qty 30, 30d supply, fill #0

## 2023-10-02 ENCOUNTER — Other Ambulatory Visit (HOSPITAL_BASED_OUTPATIENT_CLINIC_OR_DEPARTMENT_OTHER): Payer: Self-pay

## 2023-10-02 ENCOUNTER — Other Ambulatory Visit: Payer: Self-pay

## 2023-10-02 ENCOUNTER — Ambulatory Visit (INDEPENDENT_AMBULATORY_CARE_PROVIDER_SITE_OTHER): Payer: Self-pay | Admitting: Surgical

## 2023-10-02 DIAGNOSIS — Z411 Encounter for cosmetic surgery: Secondary | ICD-10-CM

## 2023-10-02 NOTE — Progress Notes (Signed)
 Botulinum Toxin Procedure Note  Procedure: Cosmetic botulinum toxin  Pre-operative Diagnosis: Dynamic rhytides  Post-operative Diagnosis: Same  Complications:  None  Brief history: The patient desires botulinum toxin injection.  She is aware of the risks including bleeding, damage to deeper structures, asymmetry, brow ptosis, eyelid ptosis, bruising. The patient understands and wishes to proceed.  Procedure: The area was prepped with alcohol and dried with a clean gauze.  Using a clean technique the botulinum toxin was diluted with 2.5 mL of bacteriostatic saline per 100 unit vial which resulted in 4 units per 0.1 mL.  Subsequently the mixture was injected in the glabellar, lateral canthal lines, forehead area with preservation of the temporal branch to the lateral eyebrow. A total of 32 Units of botulinum toxin was used. The forehead and glabellar area was injected with care to inject intramuscular only while holding pressure on the supratrochlear vessels in each area during each injection on either side of the medial corrugators. The injection proceeded vertically superiorly to the medial 2/3 of the frontalis muscle and superior 2/3 of the lateral frontalis, again with preservation of the frontal branch.  No complications were noted. Light pressure was held for 5 minutes. She was instructed explicitly in post-operative care.  Botox LOT:  [id number] EXP:  04/2025

## 2023-10-04 NOTE — Telephone Encounter (Signed)
 PACI, MD 11/14/2023 at 9:45 AM

## 2023-10-12 ENCOUNTER — Encounter: Payer: Self-pay | Admitting: Family Medicine

## 2023-11-05 ENCOUNTER — Other Ambulatory Visit: Payer: Self-pay | Admitting: Family Medicine

## 2023-11-05 ENCOUNTER — Other Ambulatory Visit (HOSPITAL_BASED_OUTPATIENT_CLINIC_OR_DEPARTMENT_OTHER): Payer: Self-pay

## 2023-11-05 MED ORDER — DEXMETHYLPHENIDATE HCL ER 10 MG PO CP24: 10.0000 mg | ORAL_CAPSULE | Freq: Every day | ORAL | 0 refills | Status: DC

## 2023-11-05 MED ORDER — DEXMETHYLPHENIDATE HCL ER 10 MG PO CP24
10.0000 mg | ORAL_CAPSULE | Freq: Every day | ORAL | 0 refills | Status: DC
  Filled 2023-11-05: qty 30, 30d supply, fill #0

## 2023-11-05 MED ORDER — DEXMETHYLPHENIDATE HCL ER 10 MG PO CP24
10.0000 mg | ORAL_CAPSULE | Freq: Every day | ORAL | 0 refills | Status: DC
  Filled 2024-05-01: qty 30, 30d supply, fill #0

## 2023-11-06 ENCOUNTER — Other Ambulatory Visit: Payer: Self-pay

## 2023-11-07 ENCOUNTER — Encounter: Payer: Self-pay | Admitting: Dermatology

## 2023-11-14 ENCOUNTER — Ambulatory Visit: Payer: 59 | Admitting: Dermatology

## 2023-11-14 NOTE — Telephone Encounter (Signed)
 Pt R/S to 05/14/24 @ 11.45am

## 2024-01-24 ENCOUNTER — Ambulatory Visit: Payer: 59 | Admitting: Dermatology

## 2024-01-24 ENCOUNTER — Encounter: Payer: Self-pay | Admitting: Dermatology

## 2024-01-24 VITALS — BP 117/77

## 2024-01-24 DIAGNOSIS — Z808 Family history of malignant neoplasm of other organs or systems: Secondary | ICD-10-CM | POA: Diagnosis not present

## 2024-01-24 DIAGNOSIS — L821 Other seborrheic keratosis: Secondary | ICD-10-CM

## 2024-01-24 DIAGNOSIS — L570 Actinic keratosis: Secondary | ICD-10-CM

## 2024-01-24 DIAGNOSIS — L814 Other melanin hyperpigmentation: Secondary | ICD-10-CM | POA: Diagnosis not present

## 2024-01-24 DIAGNOSIS — Z1283 Encounter for screening for malignant neoplasm of skin: Secondary | ICD-10-CM | POA: Diagnosis not present

## 2024-01-24 DIAGNOSIS — Z85828 Personal history of other malignant neoplasm of skin: Secondary | ICD-10-CM

## 2024-01-24 DIAGNOSIS — L578 Other skin changes due to chronic exposure to nonionizing radiation: Secondary | ICD-10-CM

## 2024-01-24 DIAGNOSIS — D229 Melanocytic nevi, unspecified: Secondary | ICD-10-CM

## 2024-01-24 DIAGNOSIS — D1801 Hemangioma of skin and subcutaneous tissue: Secondary | ICD-10-CM

## 2024-01-24 DIAGNOSIS — W908XXA Exposure to other nonionizing radiation, initial encounter: Secondary | ICD-10-CM

## 2024-01-24 DIAGNOSIS — Z86007 Personal history of in-situ neoplasm of skin: Secondary | ICD-10-CM

## 2024-01-24 NOTE — Progress Notes (Signed)
 Total Body Skin Exam (TBSE) Visit   Subjective  Megan Mcmillan is a 48 y.o. female who presents for the following: Skin Cancer Screening and Full Body Skin Exam  Patient presents today for follow up visit for TBSE. Patient was last evaluated on 09/12/23 by Dr. Paci for excision for a superficial and nodular BCC on the left lower back. Patient denies medication changes. Patient reports she does not have spots, moles and lesions of concern to be evaluated. Patient has hx of bx proven SCC & BCC. Patient reports  both mother and father had skin cancers.   The following portions of the chart were reviewed this encounter and updated as appropriate: medications, allergies, medical history  Review of Systems:  No other skin or systemic complaints except as noted in HPI or Assessment and Plan.  Objective  Well appearing patient in no apparent distress; mood and affect are within normal limits.  A full examination was performed including scalp, head, eyes, ears, nose, lips, neck, chest, axillae, abdomen, back, buttocks, bilateral upper extremities, bilateral lower extremities, hands, feet, fingers, toes, fingernails, and toenails. All findings within normal limits unless otherwise noted below.   Relevant physical exam findings are noted in the Assessment and Plan.  Mid Forehead, L chest, R chest, R upper back, lower back, mid-back, B/L thighs (15) Erythematous thin papules/macules with gritty scale.   Assessment & Plan    BASAL CELL CARCINOMA OF THE SKIN - No evidence of recurrence today on R mid back (scheduled for 05/14/24) & L lower back (Ex) - Recommend regular full body skin exams - Recommend daily broad spectrum sunscreen SPF 30+ to sun-exposed areas, reapply every 2 hours as needed.  - Call if any new or changing lesions are noted between office visits   HISTORY OF SQUAMOUS CELL CARCINOMA IN SITU OF THE SKIN - No evidence of recurrence today on R lower leg ant. (ED&C) - Recommend  regular full body skin exams - Recommend daily broad spectrum sunscreen SPF 30+ to sun-exposed areas, reapply every 2 hours as needed.  - Call if any new or changing lesions are noted between office visits  HISTORY OF SQUAMOUS CELL CARCINOMA OF THE SKIN - No evidence of recurrence today on L anterior shin (MOHS) - No lymphadenopathy - Recommend regular full body skin exams - Recommend daily broad spectrum sunscreen SPF 30+ to sun-exposed areas, reapply every 2 hours as needed.  - Call if any new or changing lesions are noted between office visits  LENTIGINES, SEBORRHEIC KERATOSES, HEMANGIOMAS - Benign normal skin lesions - Benign-appearing - Call for any changes  BENIGN MELANOCYTIC NEVI - Tan-brown and/or pink-flesh-colored symmetric macules and papules - Benign appearing on exam today - Observation - Call clinic for new or changing moles - Recommend daily use of broad spectrum spf 30+ sunscreen to sun-exposed areas.   MILD ACTINIC DAMAGE - Chronic condition, secondary to cumulative UV/sun exposure - diffuse scaly erythematous macules with underlying dyspigmentation - Recommend daily broad spectrum sunscreen SPF 30+ to sun-exposed areas, reapply every 2 hours as needed.  - Staying in the shade or wearing long sleeves, sun glasses (UVA+UVB protection) and wide brim hats (4-inch brim around the entire circumference of the hat) are also recommended for sun protection.  - Call for new or changing lesions.  SKIN CANCER SCREENING PERFORMED TODAY.  AK (ACTINIC KERATOSIS) (15) Mid Forehead, L chest, R chest, R upper back, lower back, mid-back, B/L thighs (15) Destruction of lesion - Mid Forehead, L chest,  R chest, R upper back, lower back, mid-back, B/L thighs (15) Complexity: simple   Destruction method: cryotherapy   Informed consent: discussed and consent obtained   Timeout:  patient name, date of birth, surgical site, and procedure verified Lesion destroyed using liquid nitrogen:  Yes   Post-procedure details: wound care instructions given    Return in about 6 months (around 07/25/2024) for TBSE.   Documentation: I have reviewed the above documentation for accuracy and completeness, and I agree with the above.  I, Shirron Louanne Roussel, CMA, am acting as scribe for [employer], DO.   Louana Roup, DO

## 2024-01-24 NOTE — Patient Instructions (Addendum)

## 2024-01-30 ENCOUNTER — Ambulatory Visit (INDEPENDENT_AMBULATORY_CARE_PROVIDER_SITE_OTHER): Payer: Self-pay | Admitting: Surgical

## 2024-01-30 DIAGNOSIS — Z411 Encounter for cosmetic surgery: Secondary | ICD-10-CM

## 2024-01-30 NOTE — Progress Notes (Signed)
 Botulinum Toxin Procedure Note  Procedure: Cosmetic botulinum toxin  Pre-operative Diagnosis: Dynamic rhytides  Post-operative Diagnosis: Same  Complications:  None  Brief history: The patient desires botulinum toxin injection.  She is aware of the risks including bleeding, damage to deeper structures, asymmetry, brow ptosis, eyelid ptosis, bruising. The patient understands and wishes to proceed.  Procedure: The area was prepped with alcohol and dried with a clean gauze.  Using a clean technique the botulinum toxin was diluted with 2.5 mL of bacteriostatic saline per 100 unit vial which resulted in 4 units per 0.1 mL.  Subsequently the mixture was injected in the glabellar, lateral canthal lines, forehead area with preservation of the temporal branch to the lateral eyebrow. A total of 32 Units of botulinum toxin was used. The forehead and glabellar area was injected with care to inject intramuscular only while holding pressure on the supratrochlear vessels in each area during each injection on either side of the medial corrugators. The injection proceeded vertically superiorly to the medial 2/3 of the frontalis muscle and superior 2/3 of the lateral frontalis, again with preservation of the frontal branch.  No complications were noted. Light pressure was held for 5 minutes. She was instructed explicitly in post-operative care.  Botox LOT:  [id number] EXP:  12/2025

## 2024-02-25 ENCOUNTER — Encounter: Payer: Self-pay | Admitting: Family Medicine

## 2024-02-25 ENCOUNTER — Other Ambulatory Visit (HOSPITAL_COMMUNITY): Payer: Self-pay

## 2024-02-25 ENCOUNTER — Telehealth: Admitting: Family Medicine

## 2024-02-25 VITALS — Ht 65.0 in | Wt 144.0 lb

## 2024-02-25 DIAGNOSIS — U071 COVID-19: Secondary | ICD-10-CM

## 2024-02-25 MED ORDER — NIRMATRELVIR/RITONAVIR (PAXLOVID)TABLET
3.0000 | ORAL_TABLET | Freq: Two times a day (BID) | ORAL | 0 refills | Status: AC
  Filled 2024-02-25: qty 30, 5d supply, fill #0

## 2024-02-25 NOTE — Progress Notes (Signed)
 Virtual Visit via Video Note  I connected with Megan Mcmillan on 02/25/24 at  8:20 AM EDT by a video enabled telemedicine application and verified that I am speaking with the correct person using two identifiers.  Patient Location: Home Provider Location: Office/Clinic  I discussed the limitations, risks, security, and privacy concerns of performing an evaluation and management service by video and the availability of in person appointments. I also discussed with the patient that there may be a patient responsible charge related to this service. The patient expressed understanding and agreed to proceed.  Subjective: PCP: Katrinka Garnette KIDD, MD  Chief Complaint  Patient presents with   Covid Positive    Started Thurs - worse on FRI - stuffy, body aches, fever, chills, cough, productive, ST, ear pressure Son, daughter in law and grandchild all positive recently      Patient complains of cough, head congestion, sore throat, ear pain/pressure, fever, body aches, and chills. She denies shortness of breath and wheezing. Onset of symptoms was 4 days ago, gradually worsening since that time. She is drinking plenty of fluids. Treatment to date: Advil Cold & Sinus, Mucinex, Sudafed, Afrin nasal spray, Tylenol, and Ibuprofen.  She does not smoke.     ROS: Per HPI  Current Outpatient Medications:    bimatoprost  (LATISSE ) 0.03 % ophthalmic solution, Place one drop on applicator and apply evenly along the skin of the upper eyelid at base of eyelashes once daily at bedtime; repeat procedure for second eye (use a clean applicator). Patient may choose 3 ml size per preference, Disp: 5 mL, Rfl: 12   dexmethylphenidate  (FOCALIN  XR) 10 MG 24 hr capsule, Take 1 capsule (10 mg total) by mouth daily., Disp: 30 capsule, Rfl: 0   levonorgestrel  (MIRENA , 52 MG,) 20 MCG/24HR IUD, Mirena  20 mcg/24 hours (7 yrs) 52 mg intrauterine device, Disp: , Rfl:    dexmethylphenidate  (FOCALIN  XR) 10 MG 24 hr capsule, Take  1 capsule (10 mg total) by mouth daily. (Patient not taking: Reported on 02/25/2024), Disp: 30 capsule, Rfl: 0   dexmethylphenidate  (FOCALIN  XR) 10 MG 24 hr capsule, Take 1 capsule (10 mg total) by mouth daily. (Patient not taking: Reported on 02/25/2024), Disp: 30 capsule, Rfl: 0   fluticasone  (FLONASE ) 50 MCG/ACT nasal spray, PLACE 2 SPRAYS INTO BOTH NOSTRILS AS NEEDED. (Patient not taking: Reported on 02/25/2024), Disp: 16 g, Rfl: 11  Observations/Objective: Today's Vitals   02/25/24 0817  Weight: 144 lb (65.3 kg)  Height: 5' 5 (1.651 m)   Physical Exam Constitutional:      General: She is not in acute distress.    Appearance: Normal appearance. She is not ill-appearing or toxic-appearing.  Eyes:     General: No scleral icterus.       Right eye: No discharge.        Left eye: No discharge.     Conjunctiva/sclera: Conjunctivae normal.  Pulmonary:     Effort: Pulmonary effort is normal. No respiratory distress.  Neurological:     Mental Status: She is alert and oriented to person, place, and time.  Psychiatric:        Mood and Affect: Mood normal.        Behavior: Behavior normal.        Thought Content: Thought content normal.        Judgment: Judgment normal.     Assessment and Plan: 1. COVID-19 (Primary) Education provided on COVID-19.  Continue symptom management. Recommended humidifier with liquid Vicks.. Medication reactions  checked with the [facility] COVID-19 drug interactions; no interactions.  - nirmatrelvir /ritonavir  (PAXLOVID ) 20 x 150 MG & 10 x 100MG  TABS; Take 3 tablets by mouth 2 (two) times daily for 5 days. (Take nirmatrelvir  150 mg two tablets twice daily for 5 days and ritonavir  100 mg one tablet twice daily for 5 days) Patient GFR is 93  Dispense: 30 tablet; Refill: 0   Follow Up Instructions: Return if symptoms worsen or fail to improve.   I discussed the assessment and treatment plan with the patient. The patient was provided an opportunity to  ask questions, and all were answered. The patient agreed with the plan and demonstrated an understanding of the instructions.   The patient was advised to call back or seek an in-person evaluation if the symptoms worsen or if the condition fails to improve as anticipated.  The above assessment and management plan was discussed with the patient. The patient verbalized understanding of and has agreed to the management plan.   Niki JULIANNA Rung, FNP

## 2024-03-11 ENCOUNTER — Other Ambulatory Visit (HOSPITAL_BASED_OUTPATIENT_CLINIC_OR_DEPARTMENT_OTHER): Payer: Self-pay

## 2024-03-11 ENCOUNTER — Encounter: Payer: Self-pay | Admitting: Family Medicine

## 2024-03-11 ENCOUNTER — Ambulatory Visit: Payer: 59 | Admitting: Family Medicine

## 2024-03-11 VITALS — BP 124/68 | HR 91 | Temp 97.3°F | Ht 65.0 in | Wt 145.6 lb

## 2024-03-11 DIAGNOSIS — F988 Other specified behavioral and emotional disorders with onset usually occurring in childhood and adolescence: Secondary | ICD-10-CM | POA: Diagnosis not present

## 2024-03-11 DIAGNOSIS — Z79899 Other long term (current) drug therapy: Secondary | ICD-10-CM | POA: Diagnosis not present

## 2024-03-11 DIAGNOSIS — R232 Flushing: Secondary | ICD-10-CM | POA: Insufficient documentation

## 2024-03-11 DIAGNOSIS — R7401 Elevation of levels of liver transaminase levels: Secondary | ICD-10-CM | POA: Diagnosis not present

## 2024-03-11 MED ORDER — VEOZAH 45 MG PO TABS
45.0000 mg | ORAL_TABLET | Freq: Every day | ORAL | 11 refills | Status: DC
  Filled 2024-03-11: qty 30, 30d supply, fill #0

## 2024-03-11 NOTE — Patient Instructions (Addendum)
 Trial Veozah  if cost effective. Check liver function tests in 2 months if you start this. If not let me know and we can look at estrogen therapy in addition to the IUD  Recommended follow up: Return in about 6 months (around 09/11/2024) for physical or sooner if needed.Schedule b4 you leave.

## 2024-03-11 NOTE — Assessment & Plan Note (Signed)
 S:more frequent hot flashes/night sweats about 5/7 nights.  She also is dealing with hot flashes during the daytime though not as severe.  No fevers with these episodes.  No abnormal fatigue.  More moody- wonders if could be related to focalin  though. Some hair changes. More back fat and abdominal fat but weight stable. On IUD with mirena - keeping for at least another year.   A/P: Patient is at least perimenopausal-with IUD and would be difficult to tell if truly menopausal.  We discussed treatment options including adding estrogen therapy or Veozah .  We reviewed recent changes which have suggested hormone replacement therapy benefit likely outweighs risks that we discussed the risks today including clots (mother with recent clots venous after pseudoaneurysm), breast cancer risk, cardiovascular disease.  After discussion she opted to trial Veozah  and recheck LFTs in 2 months as long as cost effective.  If not I will do some more research on combination of estrogen with IUD

## 2024-03-11 NOTE — Progress Notes (Signed)
 Phone [phone] In person visit   Subjective:   Megan Mcmillan is a 48 y.o. year old very pleasant female patient who presents for/with See problem oriented charting Chief Complaint  Patient presents with   Medical Management of Chronic Issues   harmone issues   Past Medical History-  Patient Active Problem List   Diagnosis Date Noted   Attention deficit disorder (ADD) in adult 09/05/2023    Priority: Medium    Venous insufficiency 06/11/2019    Priority: Medium    History of squamous cell carcinoma 08/19/2014    Priority: Medium    Seborrheic dermatitis 08/28/2017    Priority: Low   Actinic keratosis 06/08/2015    Priority: Low   History of cervical cancer 08/19/2014    Priority: Low   Vitamin D  deficiency 08/19/2014    Priority: Low   Former smoker 08/19/2014    Priority: Low   Right knee pain 03/07/2019    Priority: 1.   Gluteal tendonitis of left buttock 11/05/2018    Priority: 1.   Tendonitis 05/31/2018    Priority: 1.   Olecranon bursitis of right elbow 05/31/2018    Priority: 1.   Weakness of right hip 03/28/2018    Priority: 1.   Acromioclavicular sprain 04/29/2015    Priority: 1.   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 Patient may choose 3 ml size per preference 5 mL 12   dexmethylphenidate  (FOCALIN  XR) 10 MG 24 hr capsule Take 1 capsule (10 mg total) by mouth daily. 30 capsule 0   levonorgestrel  (MIRENA , 52 MG,) 20 MCG/24HR IUD Mirena  20 mcg/24 hours (7 yrs) 52 mg intrauterine device     dexmethylphenidate  (FOCALIN  XR) 10 MG 24  hr capsule Take 1 capsule (10 mg total) by mouth daily. 30 capsule 0   dexmethylphenidate  (FOCALIN  XR) 10 MG 24 hr capsule Take 1 capsule (10 mg total) by mouth daily. (Patient not taking: Reported on 02/25/2024) 30 capsule 0   fluticasone  (FLONASE ) 50 MCG/ACT nasal spray PLACE 2 SPRAYS INTO BOTH NOSTRILS AS NEEDED. (Patient not taking: Reported on 02/25/2024) 16 g 11   No current facility-administered medications for this visit.     Objective:  BP 124/68   Pulse 91   Temp (!) 97.3 F (36.3 C)   Ht 5' 5 (1.651 m)   Wt 145 lb 9.6 oz (66 kg)   SpO2 98%   BMI 24.23 kg/m  Gen: NAD, resting comfortably CV: RRR no murmurs rubs or gallops Lungs: CTAB no crackles, wheeze, rhonchi Ext: no edema Skin: warm, dry     Assessment and Plan   #Recent COVID- was more difficult case and had sinus infection- finally feeling better  # Hormonal changes S:more frequent hot flashes/night sweats about 5/7 nights.  She also is dealing with hot flashes during the daytime though not as severe.  No fevers with these episodes.  No abnormal fatigue.  More moody- wonders if could be related to focalin  though. Some hair changes. More back fat and abdominal fat but weight stable. On IUD with mirena - keeping  for at least another year.   A/P: Patient is at least perimenopausal-with IUD and would be difficult to tell if truly menopausal.  We discussed treatment options including adding estrogen therapy or Veozah .  We reviewed recent changes which have suggested hormone replacement therapy benefit likely outweighs risks that we discussed the risks today including clots (mother with recent clots venous after pseudoaneurysm), breast cancer risk, cardiovascular disease.  After discussion she opted to trial Veozah  and recheck LFTs in 2 months as long as cost effective.  If not I will do some more research on combination of estrogen with IUD -No red flag/obvious B symptoms -Does have some hair changes but hold off on TSH  check for now  # attention deficit disorder- genesight testing under media S:control: working but taking summer break as not in school right now medication:  focalin  10mg  NCCSRS/PDMP reviewed: Refill not needed today Original diagnosis: [place] [facility] February 05 2023 scanned in  A/P: Reasonable control-continue current medication when she restarts school-does not need refill yet but willing to refill after checking PDMP when she reaches out  Recommended follow up: No follow-ups on file. Future Appointments  Date Time Provider Department Center  05/14/2024 11:45 AM Corey Rufus HERO, MD [facility] None  07/23/2024  9:45 AM Alm Delon SAILOR, DO [facility] None  09/09/2024  8:00 AM Katrinka Garnette KIDD, MD [facility]    Lab/Order associations: No diagnosis found.  No orders of the defined types were placed in this encounter.   Return precautions advised.  Garnette Katrinka, MD

## 2024-03-12 ENCOUNTER — Encounter: Payer: Self-pay | Admitting: Family Medicine

## 2024-03-13 ENCOUNTER — Other Ambulatory Visit (HOSPITAL_BASED_OUTPATIENT_CLINIC_OR_DEPARTMENT_OTHER): Payer: Self-pay

## 2024-03-18 ENCOUNTER — Other Ambulatory Visit: Payer: Self-pay | Admitting: Nurse Practitioner

## 2024-03-18 ENCOUNTER — Other Ambulatory Visit (HOSPITAL_BASED_OUTPATIENT_CLINIC_OR_DEPARTMENT_OTHER): Payer: Self-pay

## 2024-03-18 MED ORDER — FLUCONAZOLE 150 MG PO TABS
150.0000 mg | ORAL_TABLET | Freq: Every day | ORAL | 0 refills | Status: DC
  Filled 2024-03-18: qty 2, 3d supply, fill #0

## 2024-03-18 NOTE — Progress Notes (Signed)
 Experiencing yeast symptoms after taking augmentin . Will send in diflucan  150mg  x1 with a second dose in 3 days if needed.

## 2024-04-08 DIAGNOSIS — Z1151 Encounter for screening for human papillomavirus (HPV): Secondary | ICD-10-CM | POA: Diagnosis not present

## 2024-04-08 DIAGNOSIS — Z01419 Encounter for gynecological examination (general) (routine) without abnormal findings: Secondary | ICD-10-CM | POA: Diagnosis not present

## 2024-04-08 DIAGNOSIS — Z6823 Body mass index (BMI) 23.0-23.9, adult: Secondary | ICD-10-CM | POA: Diagnosis not present

## 2024-04-08 DIAGNOSIS — N951 Menopausal and female climacteric states: Secondary | ICD-10-CM | POA: Diagnosis not present

## 2024-04-08 DIAGNOSIS — Z124 Encounter for screening for malignant neoplasm of cervix: Secondary | ICD-10-CM | POA: Diagnosis not present

## 2024-04-09 ENCOUNTER — Ambulatory Visit (INDEPENDENT_AMBULATORY_CARE_PROVIDER_SITE_OTHER)

## 2024-04-09 ENCOUNTER — Encounter: Payer: Self-pay | Admitting: Podiatry

## 2024-04-09 ENCOUNTER — Ambulatory Visit (INDEPENDENT_AMBULATORY_CARE_PROVIDER_SITE_OTHER): Payer: Self-pay | Admitting: Podiatry

## 2024-04-09 VITALS — Ht 65.0 in | Wt 145.6 lb

## 2024-04-09 DIAGNOSIS — M722 Plantar fascial fibromatosis: Secondary | ICD-10-CM

## 2024-04-09 DIAGNOSIS — M7751 Other enthesopathy of right foot: Secondary | ICD-10-CM

## 2024-04-09 NOTE — Progress Notes (Signed)
   Chief Complaint  Patient presents with   Foot Pain    Pt is here due to knot on th bottom of her right foot, she states when she noticed it there was no pain, but in the last week it's been hard to walk on.    HPI: 48 y.o. female presenting today as a new patient for evaluation of 2 separate issues.  First the patient states that when she wears flats or goes barefoot she notices that her toes around the 3rd and 4th digits go numb.  This is only when she wears flats or goes barefoot  Also the patient has noticed over the past month a palpable lesion to the plantar aspect of the right foot that she believes is a plantar fibroma.  She says that her mother and grandfather had the same lesions in their feet and hands.  Past Medical History:  Diagnosis Date   Basal cell adenocarcinoma    Migraine 08/19/2014   No recurrence after birth of son. Previously on flexeril.     Squamous cell carcinoma of skin     Past Surgical History:  Procedure Laterality Date   CERVICAL CONIZATION W/BX  1997   cold knife, no abnormal paps since.    VAGINAL DELIVERY  1998    Allergies  Allergen Reactions   Latex     swelling   Wellbutrin [Bupropion]     Vivid dreams/night terrors     Physical Exam: General: The patient is alert and oriented x3 in no acute distress.  Dermatology: Skin is warm, dry and supple bilateral lower extremities.   Vascular: Palpable pedal pulses bilaterally. Capillary refill within normal limits.  No appreciable edema.  No erythema.  Neurological: Grossly intact via light touch  Musculoskeletal Exam: Palpable plantar fibroma noted just proximal to the first MTP of the right foot  Radiographic Exam RT foot 04/09/2024:  Normal osseous mineralization.  Irregularity noted to the medial aspect of the navicular secondary to a history of trauma several years prior.  Clinically this area is asymptomatic.  No acute fractures  Assessment/Plan of Care: 1.   intermittent  neuritis/paresthesia right forefoot -Seems that the paresthesia that she experiences to the right forefoot is secondary to flats or going barefoot.  When she is in good supportive shoes she does not have any of the symptoms.  Recommend good supportive shoes and advised against going barefoot or wearing flats  2.  Plantar fibroma right foot -Currently the plantar fibroma is relatively asymptomatic.  She says it is not necessarily painful but she does notice it on occasion.  Again, recommend conservative treatment measures including good supportive shoes and sneakers.   Chemical engineer for [facility]   Thresa EMERSON Sar, DPM [facility]  Dr. Thresa EMERSON Sar, DPM    [address] [place], [place] [address]                Office [phone]  Fax [phone]

## 2024-04-16 ENCOUNTER — Other Ambulatory Visit (HOSPITAL_BASED_OUTPATIENT_CLINIC_OR_DEPARTMENT_OTHER): Payer: Self-pay

## 2024-04-16 DIAGNOSIS — Z79890 Hormone replacement therapy (postmenopausal): Secondary | ICD-10-CM | POA: Diagnosis not present

## 2024-04-16 DIAGNOSIS — Z30433 Encounter for removal and reinsertion of intrauterine contraceptive device: Secondary | ICD-10-CM | POA: Diagnosis not present

## 2024-04-16 DIAGNOSIS — Z3202 Encounter for pregnancy test, result negative: Secondary | ICD-10-CM | POA: Diagnosis not present

## 2024-04-16 MED ORDER — ESTRADIOL 0.025 MG/24HR TD PTTW
1.0000 | MEDICATED_PATCH | TRANSDERMAL | 3 refills | Status: DC
  Filled 2024-04-16: qty 8, 28d supply, fill #0
  Filled 2024-05-08: qty 8, 28d supply, fill #1
  Filled 2024-06-04: qty 8, 28d supply, fill #2

## 2024-04-17 ENCOUNTER — Ambulatory Visit (INDEPENDENT_AMBULATORY_CARE_PROVIDER_SITE_OTHER): Payer: Self-pay

## 2024-04-17 DIAGNOSIS — R238 Other skin changes: Secondary | ICD-10-CM

## 2024-04-17 DIAGNOSIS — L988 Other specified disorders of the skin and subcutaneous tissue: Secondary | ICD-10-CM

## 2024-04-17 NOTE — Progress Notes (Addendum)
 Botulinum Toxin Procedure Note  Procedure: Cosmetic botulinum toxin   Pre-operative Diagnosis: Dynamic rhytides   Post-operative Diagnosis: Same  Complications:  None  Brief history: The patient desires botulinum toxin injection of her forehead, crows feet and glabellar muscles. I discussed with the patient this proposed procedure of botulinum toxin injections, which is customized depending on the particular needs of the patient. It is performed on facial rhytids as a temporary correction. The alternatives were discussed with the patient. The risks were addressed including bleeding, scarring, infection, damage to deeper structures, asymmetry, and chronic pain, which may occur infrequently after a procedure. The individual's choice to undergo a surgical procedure is based on the comparison of risks to potential benefits. Other risks include unsatisfactory results, brow ptosis, eyelid ptosis, allergic reaction, temporary paralysis, which should go away with time, bruising, blurring disturbances and delayed healing. Botulinum toxin injections do not arrest the aging process or produce permanent tightening of the eyelid.  Operative intervention maybe necessary to maintain the results of a blepharoplasty or botulinum toxin. The patient understands and wishes to proceed.  Procedure: The area was prepped with alcohol and dried with a clean gauze. Using a clean technique, the botulinum toxin was diluted with 2.5 cc of preservative-free normal saline which was slowly injected with an 18 gauge needle in a tuberculin syringes.  A 32 gauge needles were then used to inject the botulinum toxin. This mixture allow for an aliquot of 4 units per 0.1 cc in each injection site.    Subsequently the mixture was injected in the crows feet glabellar and forehead area with preservation of the temporal branch to the lateral eyebrow as well as into each lateral canthal area beginning from the lateral orbital rim medial to the  zygomaticus major in 3 separate areas. A total of 50 Units of botulinum toxin was used. The forehead and glabellar area was injected with care to inject intramuscular only while holding pressure on the supratrochlear vessels in each area during each injection on either side of the medial corrugators. The injection proceeded vertically superiorly to the medial 2/3 of the frontalis muscle and superior 2/3 of the lateral frontalis, again with preservation of the frontal branch.  Botox LOT:  [id number]  EXP:  2027/05  Patient was also provided with quotes for HALO laser, fillers and referred to [facility] for skin care.   Cintya Daughety M. Tyasia Packard, MD [facility]

## 2024-04-23 ENCOUNTER — Other Ambulatory Visit: Payer: Self-pay | Admitting: Family Medicine

## 2024-04-23 DIAGNOSIS — Z1231 Encounter for screening mammogram for malignant neoplasm of breast: Secondary | ICD-10-CM

## 2024-05-01 ENCOUNTER — Other Ambulatory Visit (HOSPITAL_BASED_OUTPATIENT_CLINIC_OR_DEPARTMENT_OTHER): Payer: Self-pay

## 2024-05-07 ENCOUNTER — Encounter: Payer: Self-pay | Admitting: Dermatology

## 2024-05-10 ENCOUNTER — Other Ambulatory Visit: Payer: Self-pay | Admitting: Family Medicine

## 2024-05-12 ENCOUNTER — Other Ambulatory Visit (HOSPITAL_BASED_OUTPATIENT_CLINIC_OR_DEPARTMENT_OTHER): Payer: Self-pay

## 2024-05-12 MED ORDER — FLUTICASONE PROPIONATE 50 MCG/ACT NA SUSP
NASAL | 11 refills | Status: DC
  Filled 2024-05-12: qty 16, 30d supply, fill #0
  Filled 2024-06-12: qty 16, 30d supply, fill #1

## 2024-05-13 ENCOUNTER — Encounter

## 2024-05-13 ENCOUNTER — Other Ambulatory Visit (HOSPITAL_BASED_OUTPATIENT_CLINIC_OR_DEPARTMENT_OTHER): Payer: Self-pay

## 2024-05-13 DIAGNOSIS — Z1231 Encounter for screening mammogram for malignant neoplasm of breast: Secondary | ICD-10-CM

## 2024-05-14 ENCOUNTER — Ambulatory Visit: Admitting: Dermatology

## 2024-05-21 ENCOUNTER — Ambulatory Visit
Admission: RE | Admit: 2024-05-21 | Discharge: 2024-05-21 | Disposition: A | Source: Ambulatory Visit | Attending: Family Medicine | Admitting: Family Medicine

## 2024-05-21 DIAGNOSIS — Z1231 Encounter for screening mammogram for malignant neoplasm of breast: Secondary | ICD-10-CM | POA: Diagnosis not present

## 2024-05-22 ENCOUNTER — Ambulatory Visit

## 2024-05-22 ENCOUNTER — Ambulatory Visit: Payer: Self-pay | Admitting: Family Medicine

## 2024-05-22 ENCOUNTER — Ambulatory Visit: Admitting: Family Medicine

## 2024-05-22 VITALS — BP 120/86 | HR 84 | Ht 65.0 in

## 2024-05-22 DIAGNOSIS — Z79899 Other long term (current) drug therapy: Secondary | ICD-10-CM

## 2024-05-22 DIAGNOSIS — R7401 Elevation of levels of liver transaminase levels: Secondary | ICD-10-CM | POA: Diagnosis not present

## 2024-05-22 DIAGNOSIS — M50323 Other cervical disc degeneration at C6-C7 level: Secondary | ICD-10-CM | POA: Diagnosis not present

## 2024-05-22 DIAGNOSIS — M542 Cervicalgia: Secondary | ICD-10-CM | POA: Diagnosis not present

## 2024-05-22 DIAGNOSIS — R202 Paresthesia of skin: Secondary | ICD-10-CM

## 2024-05-22 DIAGNOSIS — R200 Anesthesia of skin: Secondary | ICD-10-CM

## 2024-05-22 DIAGNOSIS — M47812 Spondylosis without myelopathy or radiculopathy, cervical region: Secondary | ICD-10-CM | POA: Diagnosis not present

## 2024-05-22 LAB — COMPREHENSIVE METABOLIC PANEL WITH GFR
ALT: 44 U/L — ABNORMAL HIGH (ref 0–35)
AST: 40 U/L — ABNORMAL HIGH (ref 0–37)
Albumin: 4.8 g/dL (ref 3.5–5.2)
Alkaline Phosphatase: 61 U/L (ref 39–117)
BUN: 17 mg/dL (ref 6–23)
CO2: 28 meq/L (ref 19–32)
Calcium: 9.8 mg/dL (ref 8.4–10.5)
Chloride: 99 meq/L (ref 96–112)
Creatinine, Ser: 0.78 mg/dL (ref 0.40–1.20)
GFR: 88.98 mL/min (ref >60.00)
Glucose, Bld: 89 mg/dL (ref 70–99)
Potassium: 4.1 meq/L (ref 3.5–5.1)
Sodium: 140 meq/L (ref 135–145)
Total Bilirubin: 0.7 mg/dL (ref 0.2–1.2)
Total Protein: 7.8 g/dL (ref 6.0–8.3)

## 2024-05-22 LAB — SEDIMENTATION RATE: Sed Rate: 5 mm/h (ref 0–20)

## 2024-05-22 LAB — CBC WITH DIFFERENTIAL/PLATELET
Basophils Absolute: 0 K/uL (ref 0.0–0.1)
Basophils Relative: 0.1 % (ref 0.0–3.0)
Eosinophils Absolute: 0 K/uL (ref 0.0–0.7)
Eosinophils Relative: 0.3 % (ref 0.0–5.0)
HCT: 41.7 % (ref 36.0–46.0)
Hemoglobin: 14.1 g/dL (ref 12.0–15.0)
Lymphocytes Relative: 12.5 % (ref 12.0–46.0)
Lymphs Abs: 1.2 K/uL (ref 0.7–4.0)
MCHC: 33.7 g/dL (ref 30.0–36.0)
MCV: 100 fl (ref 78.0–100.0)
Monocytes Absolute: 0.5 K/uL (ref 0.1–1.0)
Monocytes Relative: 5.2 % (ref 3.0–12.0)
Neutro Abs: 8.1 K/uL — ABNORMAL HIGH (ref 1.4–7.7)
Neutrophils Relative %: 81.9 % — ABNORMAL HIGH (ref 43.0–77.0)
Platelets: 223 K/uL (ref 150.0–400.0)
RBC: 4.17 Mil/uL (ref 3.87–5.11)
RDW: 14.4 % (ref 11.5–15.5)
WBC: 9.9 K/uL (ref 4.0–10.5)

## 2024-05-22 LAB — T3, FREE: T3, Free: 3.5 pg/mL (ref 2.3–4.2)

## 2024-05-22 LAB — URIC ACID: Uric Acid, Serum: 5.2 mg/dL (ref 2.4–7.0)

## 2024-05-22 LAB — TSH: TSH: 1.65 u[IU]/mL (ref 0.35–5.50)

## 2024-05-22 LAB — IBC PANEL
Iron: 151 ug/dL — ABNORMAL HIGH (ref 42–145)
Saturation Ratios: 43.8 % (ref 20.0–50.0)
TIBC: 344.4 ug/dL (ref 250.0–450.0)
Transferrin: 246 mg/dL (ref 212.0–360.0)

## 2024-05-22 LAB — VITAMIN B12: Vitamin B-12: 396 pg/mL (ref 211–911)

## 2024-05-22 LAB — VITAMIN D 25 HYDROXY (VIT D DEFICIENCY, FRACTURES): VITD: 59.6 ng/mL (ref 30.00–100.00)

## 2024-05-22 LAB — T4, FREE: Free T4: 0.73 ng/dL (ref 0.60–1.60)

## 2024-05-22 LAB — C-REACTIVE PROTEIN: CRP: <0.5 mg/dL (ref 0.5–20.0)

## 2024-05-22 LAB — FERRITIN: Ferritin: 209.2 ng/mL (ref 10.0–291.0)

## 2024-05-22 NOTE — Assessment & Plan Note (Addendum)
 Patient states that it does happen in both arms.  Unable to exacerbated today.  Good grip strength noted.  Deep tendon reflexes of the upper extremity okay.  Had tingling in the face which is new.  Only new medication includes a estrogen patch.  No new vitamin or supplementations.  No increasing in stress recently.  Did have Botox approximately 1 month ago.  None of these seem to fit perfectly patient's symptomatology.  No sign of any slurring of the speech, any abnormal headache or visual changes.  We did discuss that if any of these do occur that patient needs to seek medical attention immediately.  Differential is quite broad and laboratory workup ordered today to further evaluate for any deficiencies that could be potentially contributing.  In addition to this carpal tunnel or another nerve injury could be potential and may need to consider a nerve conduction study if this continues.  Follow-up with me again in 1 month otherwise.  With bilateral extremities though well also can consider advanced imaging of a MRI of the neck to rule out such things as multiple sclerosis if necessary.

## 2024-05-22 NOTE — Progress Notes (Signed)
 [facility] [address] [place] [address] Phone: [phone] Subjective:    I'm seeing this patient by the request  of:  Katrinka Garnette KIDD, MD  CC: Neck and back pain but more arm tingling  YEP:Dlagzrupcz  Megan Mcmillan is a 48 y.o. female coming in with complaint of neck pain. Patient states that 3 weeks ago both hands went numb. Arms are going numb especially in the morning.  Also had some facial numbness this morning when she left the gym. Denies having any pain. Numbness subsides as the day goes.  Patient states it does seem to be somewhat recurrent activity.  Seems to be worse at the gym.  Had tingling in the face as well.  Denies any difficulty with her blood pressure.  Denies any visual changes, any dizziness or lightheadedness, denies any weakness of any of the extremities.  Does not remember any true injury.      Past Medical History:  Diagnosis Date   Basal cell adenocarcinoma    Migraine 08/19/2014   No recurrence after birth of son. Previously on flexeril.     Squamous cell carcinoma of skin    Past Surgical History:  Procedure Laterality Date   CERVICAL CONIZATION W/BX  1997   cold knife, no abnormal paps since.    VAGINAL DELIVERY  1998   Social History   Socioeconomic History   Marital status: Married    Spouse name: Not on file   Number of children: Not on file   Years of education: Not on file   Highest education level: Some college, no degree  Occupational History   Not on file  Tobacco Use   Smoking status: Former    Current packs/day: 0.00    Average packs/day: 1 pack/day for 21.0 years (21.0 ttl pk-yrs)    Types: Cigarettes    Start date: 11/05/1989    Quit date: 11/06/2010    Years since quitting: 13.5    Passive exposure: Never   Smokeless tobacco: Never  Vaping Use   Vaping status: Never Used  Substance and Sexual Activity   Alcohol use: Yes    Alcohol/week: 5.0 standard drinks of alcohol    Types: 5  Standard drinks or equivalent per week   Drug use: No   Sexual activity: Not on file  Other Topics Concern   Not on file  Social History Narrative   Married 2015. Son [address]- also patient of Dr. Katrinka.     Finished HS.       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 Perri   In the past-front office, CMA, pharmacy tech, billing and coding-diverse medical background      Hobbies: football, reading, cooking, enjoys cleaning      Social Drivers of Health   Financial Resource Strain: Low Risk  (03/10/2024)   Overall Financial Resource Strain (CARDIA)    Difficulty of Paying Living Expenses: Not hard at all  Food Insecurity: No Food Insecurity (03/10/2024)   Hunger Vital Sign    Worried About Running Out of Food in the Last Year: Never true    Ran Out of Food in the Last Year: Never true  Transportation Needs: No Transportation Needs (03/10/2024)   PRAPARE - Administrator, Civil Service (Medical): No    Lack of Transportation (Non-Medical): No  Physical Activity: Sufficiently Active (03/10/2024)   Exercise Vital Sign    Days of Exercise per  Week: 3 days    Minutes of Exercise per Session: 60 min  Stress: No Stress Concern Present (03/10/2024)   [employer] of Occupational Health - Occupational Stress Questionnaire    Feeling of Stress: Not at all  Social Connections: Socially Integrated (03/10/2024)   Social Connection and Isolation Panel    Frequency of Communication with Friends and Family: More than three times a week    Frequency of Social Gatherings with Friends and Family: More than three times a week    Attends Religious Services: More than 4 times per year    Active Member of [employer] or Organizations: Yes    Attends Engineer, structural: More than 4 times per year    Marital Status: Married   Allergies  Allergen Reactions   Latex     swelling   Wellbutrin [Bupropion]     Vivid dreams/night terrors   Family History   Problem Relation Age of Onset   Hypertension Mother    Depression Mother        depression in entire family   Heart attack Father 74       smoked marijuana for many years   Hypertension Father    Depression Father    Breast cancer Maternal Grandmother        84s   Diabetes Maternal Grandmother    Dementia Maternal Grandmother    Colon cancer Neg Hx    Colon polyps Neg Hx    Esophageal cancer Neg Hx    Stomach cancer Neg Hx    Rectal cancer Neg Hx     Current Outpatient Medications (Endocrine & Metabolic):    estradiol  (VIVELLE -DOT) 0.025 MG/24HR, Place 1 patch onto the skin 2 (two) times a week.   levonorgestrel  (MIRENA , 52 MG,) 20 MCG/24HR IUD, Mirena  20 mcg/24 hours (7 yrs) 52 mg intrauterine device   Current Outpatient Medications (Respiratory):    fluticasone  (FLONASE ) 50 MCG/ACT nasal spray, PLACE 2 SPRAYS INTO BOTH NOSTRILS AS NEEDED.    Current Outpatient Medications (Other):    bimatoprost  (LATISSE ) 0.03 % ophthalmic solution, Place one drop on applicator and apply evenly along the skin of the upper eyelid at base of eyelashes once daily at bedtime; repeat procedure for second eye (use a clean applicator). Patient may choose 3 ml size per preference   dexmethylphenidate  (FOCALIN  XR) 10 MG 24 hr capsule, Take 1 capsule (10 mg total) by mouth daily.   dexmethylphenidate  (FOCALIN  XR) 10 MG 24 hr capsule, Take 1 capsule (10 mg total) by mouth daily.   dexmethylphenidate  (FOCALIN  XR) 10 MG 24 hr capsule, Take 1 capsule (10 mg total) by mouth daily.   Reviewed prior external information including notes and imaging from  primary care provider As well as notes that were available from care everywhere and other healthcare systems.  Past medical history, social, surgical and family history all reviewed in electronic medical record.  No pertanent information unless stated regarding to the chief complaint.   Review of Systems:  No headache, visual changes, nausea, vomiting,  diarrhea, constipation, dizziness, abdominal pain, skin rash, fevers, chills, night sweats, weight loss, swollen lymph nodes, body aches, joint swelling, chest pain, shortness of breath, mood changes. POSITIVE muscle aches  Objective  Blood pressure 120/86, pulse 84, height 5' 5 (1.651 m), SpO2 98%.   General: No apparent distress alert and oriented x3 mood and affect normal, dressed appropriately.  HEENT: Pupils equal, extraocular movements intact  Respiratory: Patient's speak in full sentences and does not  appear short of breath  Cardiovascular: No lower extremity edema, non tender, no erythema  Neck exam shows relatively good range of motion.  Negative Spurling's.  5 out of 5 strength of the upper extremities and deep tendon reflexes of the upper extremities intact.  Patient extraocular movements are intact.  Negative Tinel's at the wrist.  Negative Tinel's at the elbow.  Good strength of the shoulder noted today.    Impression and Recommendations:    Numbness and tingling in both hands Patient states that it does happen in both arms.  Unable to exacerbated today.  Good grip strength noted.  Deep tendon reflexes of the upper extremity okay.  Had tingling in the face which is new.  Only new medication includes a estrogen patch.  No new vitamin or supplementations.  No increasing in stress recently.  Did have Botox approximately 1 month ago.  None of these seem to fit perfectly patient's symptomatology.  No sign of any slurring of the speech, any abnormal headache or visual changes.  We did discuss that if any of these do occur that patient needs to seek medical attention immediately.  Differential is quite broad and laboratory workup ordered today to further evaluate for any deficiencies that could be potentially contributing.  In addition to this carpal tunnel or another nerve injury could be potential and may need to consider a nerve conduction study if this continues.  Follow-up with me again in  1 month otherwise.  With bilateral extremities though well also can consider advanced imaging of a MRI of the neck to rule out such things as multiple sclerosis if necessary.  The above documentation has been reviewed and is accurate and complete Lizann Edelman M Ark Agrusa, DO

## 2024-05-22 NOTE — Patient Instructions (Signed)
Labs today See you again in 6-8 weeks

## 2024-05-23 ENCOUNTER — Other Ambulatory Visit (HOSPITAL_BASED_OUTPATIENT_CLINIC_OR_DEPARTMENT_OTHER): Payer: Self-pay

## 2024-05-23 ENCOUNTER — Other Ambulatory Visit: Payer: Self-pay

## 2024-05-23 DIAGNOSIS — R7989 Other specified abnormal findings of blood chemistry: Secondary | ICD-10-CM

## 2024-05-23 MED FILL — Doxycycline Hyclate Tab 100 MG: 100.0000 mg | ORAL | 7 days supply | Qty: 14 | Fill #0 | Status: AC

## 2024-05-24 LAB — PTH, INTACT AND CALCIUM
Calcium: 10.4 mg/dL — ABNORMAL HIGH (ref 8.6–10.2)
PTH: 21 pg/mL (ref 16–77)

## 2024-05-24 LAB — RHEUMATOID FACTOR: Rheumatoid fact SerPl-aCnc: 11 [IU]/mL (ref <14)

## 2024-05-24 LAB — D-DIMER, QUANTITATIVE: D-Dimer, Quant: 0.2 ug{FEU}/mL (ref <0.50)

## 2024-06-04 ENCOUNTER — Other Ambulatory Visit: Payer: Self-pay

## 2024-06-04 ENCOUNTER — Other Ambulatory Visit (HOSPITAL_BASED_OUTPATIENT_CLINIC_OR_DEPARTMENT_OTHER): Payer: Self-pay

## 2024-06-04 DIAGNOSIS — Z30431 Encounter for routine checking of intrauterine contraceptive device: Secondary | ICD-10-CM | POA: Diagnosis not present

## 2024-06-04 DIAGNOSIS — Z79890 Hormone replacement therapy (postmenopausal): Secondary | ICD-10-CM | POA: Diagnosis not present

## 2024-06-04 DIAGNOSIS — R6882 Decreased libido: Secondary | ICD-10-CM | POA: Diagnosis not present

## 2024-06-04 MED ORDER — ESTRADIOL 0.05 MG/24HR TD PTTW
MEDICATED_PATCH | TRANSDERMAL | 3 refills | Status: DC
  Filled 2024-06-04: qty 24, 84d supply, fill #0
  Filled 2024-06-06: qty 16, 56d supply, fill #0
  Filled 2024-06-06: qty 8, 28d supply, fill #0

## 2024-06-06 ENCOUNTER — Other Ambulatory Visit (HOSPITAL_BASED_OUTPATIENT_CLINIC_OR_DEPARTMENT_OTHER): Payer: Self-pay

## 2024-06-11 ENCOUNTER — Encounter: Payer: Self-pay | Admitting: Podiatry

## 2024-06-11 DIAGNOSIS — H524 Presbyopia: Secondary | ICD-10-CM | POA: Diagnosis not present

## 2024-06-17 ENCOUNTER — Ambulatory Visit (INDEPENDENT_AMBULATORY_CARE_PROVIDER_SITE_OTHER): Admitting: Dermatology

## 2024-06-17 ENCOUNTER — Encounter: Payer: Self-pay | Admitting: Dermatology

## 2024-06-17 VITALS — BP 137/91 | HR 87 | Temp 98.4°F

## 2024-06-17 DIAGNOSIS — C44519 Basal cell carcinoma of skin of other part of trunk: Secondary | ICD-10-CM

## 2024-06-17 DIAGNOSIS — C4491 Basal cell carcinoma of skin, unspecified: Secondary | ICD-10-CM

## 2024-06-17 DIAGNOSIS — D225 Melanocytic nevi of trunk: Secondary | ICD-10-CM

## 2024-06-17 NOTE — Patient Instructions (Signed)

## 2024-06-17 NOTE — Progress Notes (Signed)
 Follow-Up Visit   Subjective  Megan Mcmillan is a 48 y.o. female who presents for the following: Excision of a Nodular and Superficial Basal Cell Carcinoma on the right mid back, biopsied by Dr. Alm.  The following portions of the chart were reviewed this encounter and updated as appropriate: medications, allergies, medical history  Review of Systems:  No other skin or systemic complaints except as noted in HPI or Assessment and Plan.  Objective  Well appearing patient in no apparent distress; mood and affect are within normal limits.  A focused examination was performed of the following areas: Right mid back Relevant physical exam findings are noted in the Assessment and Plan.   Mid Back Pink nodule with adjacent scar   Assessment & Plan   BASAL CELL CARCINOMA (BCC), UNSPECIFIED SITE Mid Back Skin excision  Excision method:  elliptical Lesion length (cm):  2.9 Lesion width (cm):  0.9 Margin per side (cm):  0.5 Total excision diameter (cm):  3.9 Informed consent: discussed and consent obtained   Timeout: patient name, date of birth, surgical site, and procedure verified   Procedure prep:  Patient was prepped and draped in usual sterile fashion Prep type:  Chlorhexidine Anesthesia: the lesion was anesthetized in a standard fashion   Anesthetic:  1% lidocaine w/ epinephrine  1-100,000 buffered w/ 8.4% NaHCO3 Instrument used: #15 blade   Hemostasis achieved with: suture, pressure and electrodesiccation   Outcome: patient tolerated procedure well with no complications   Post-procedure details: sterile dressing applied and wound care instructions given   Dressing type: bandage and pressure dressing    Skin repair Complexity:  Complex Final length (cm):  5.8 Informed consent: discussed and consent obtained   Timeout: patient name, date of birth, surgical site, and procedure verified   Procedure prep:  Patient was prepped and draped in usual sterile fashion Prep type:   Chlorhexidine Anesthesia: the lesion was anesthetized in a standard fashion   Anesthetic:  1% lidocaine w/ epinephrine  1-100,000 buffered w/ 8.4% NaHCO3 Reason for type of repair: reduce tension to allow closure and avoid adjacent structures   Undermining: area extensively undermined   Subcutaneous layers (deep stitches):  Suture size:  3-0 Suture type: PDS (polydioxanone)   Stitches:  Buried vertical mattress Fine/surface layer approximation (top stitches):  Suture type: cyanoacrylate tissue glue   Hemostasis achieved with: suture, pressure and electrodesiccation Outcome: patient tolerated procedure well with no complications   Post-procedure details: sterile dressing applied and wound care instructions given   Dressing type: bandage and pressure dressing    Specimen 1 - Surgical pathology Differential Diagnosis: BCC [id number] Check Margins: No  The surgical wound was then cleaned, prepped, and re-anesthetized as above. Wound edges were undermined extensively along at least one entire edge and at a distance equal to or greater than the width of the defect (see wound defect size above) in order to achieve closure and decrease wound tension and anatomic distortion. Redundant tissue repair including standing cone removal was performed. Hemostasis was achieved with electrocautery. Subcutaneous and epidermal tissues were approximated with the above sutures. The surgical site was then lightly scrubbed with sterile, saline-soaked gauze. Steri-strips were applied, and the area was then bandaged using Vaseline ointment, non-adherent gauze, gauze pads, and tape to provide an adequate pressure dressing. The patient tolerated the procedure well, was given detailed written and verbal wound care instructions, and was discharged in good condition.   The patient will follow-up: PRN.  Return if symptoms worsen or fail to  improve.  I, Doyce Pan, CMA, am acting as scribe for RUFUS CHRISTELLA HOLY, MD.    Documentation: I have reviewed the above documentation for accuracy and completeness, and I agree with the above.  RUFUS CHRISTELLA HOLY, MD

## 2024-06-18 LAB — SURGICAL PATHOLOGY

## 2024-06-19 ENCOUNTER — Ambulatory Visit: Payer: Self-pay | Admitting: Dermatology

## 2024-06-25 ENCOUNTER — Encounter: Payer: Self-pay | Admitting: Podiatry

## 2024-07-04 ENCOUNTER — Ambulatory Visit (INDEPENDENT_AMBULATORY_CARE_PROVIDER_SITE_OTHER): Payer: Self-pay

## 2024-07-04 DIAGNOSIS — R238 Other skin changes: Secondary | ICD-10-CM

## 2024-07-04 NOTE — Progress Notes (Addendum)
 Botulinum Toxin Procedure Note  Procedure: Cosmetic botulinum toxin   Pre-operative Diagnosis: Dynamic rhytides   Post-operative Diagnosis: Same  Complications:  None  Brief history: The patient desires botulinum toxin injection of her forehead, crows feet and glabellar muscles. I discussed with the patient this proposed procedure of botulinum toxin injections, which is customized depending on the particular needs of the patient. It is performed on facial rhytids as a temporary correction. The alternatives were discussed with the patient. The risks were addressed including bleeding, scarring, infection, damage to deeper structures, asymmetry, and chronic pain, which may occur infrequently after a procedure. The individual's choice to undergo a surgical procedure is based on the comparison of risks to potential benefits. Other risks include unsatisfactory results, brow ptosis, eyelid ptosis, allergic reaction, temporary paralysis, which should go away with time, bruising, blurring disturbances and delayed healing. Botulinum toxin injections do not arrest the aging process or produce permanent tightening of the eyelid.  Operative intervention maybe necessary to maintain the results of a blepharoplasty or botulinum toxin. The patient understands and wishes to proceed.   Procedure: The area was prepped with alcohol and dried with a clean gauze. Using a clean technique, the botulinum toxin was diluted with 2.5 cc of preservative-free normal saline which was slowly injected with an 18 gauge needle in a tuberculin syringes.  A 32 gauge needles were then used to inject the botulinum toxin. This mixture allow for an aliquot of 4 units per 0.1 cc in each injection site.     Subsequently the mixture was injected in the crows feet glabellar and forehead area with preservation of the temporal branch to the lateral eyebrow as well as into each lateral canthal area beginning from the lateral orbital rim medial to the  zygomaticus major in 3 separate areas. A total of 50 Units of botulinum toxin was used. The forehead and glabellar area was injected with care to inject intramuscular only while holding pressure on the supratrochlear vessels in each area during each injection on either side of the medial corrugators. The injection proceeded vertically superiorly to the medial 2/3 of the frontalis muscle and superior 2/3 of the lateral frontalis, again with preservation of the frontal branch. No complications were noted. Light pressure was held for 5 minutes. She was instructed explicitly in post-operative care.  Botox LOT:  [id number]  EXP:  2027/10

## 2024-07-15 ENCOUNTER — Other Ambulatory Visit: Payer: Self-pay

## 2024-07-15 ENCOUNTER — Encounter: Payer: Self-pay | Admitting: Family Medicine

## 2024-07-15 ENCOUNTER — Other Ambulatory Visit (HOSPITAL_BASED_OUTPATIENT_CLINIC_OR_DEPARTMENT_OTHER): Payer: Self-pay

## 2024-07-15 MED ORDER — DEXMETHYLPHENIDATE HCL ER 10 MG PO CP24
10.0000 mg | ORAL_CAPSULE | Freq: Every day | ORAL | 0 refills | Status: AC
  Filled 2024-07-15: qty 30, 30d supply, fill #0

## 2024-07-15 MED ORDER — FLUTICASONE PROPIONATE 50 MCG/ACT NA SUSP
2.0000 | Freq: Every day | NASAL | 3 refills | Status: AC | PRN
  Filled 2024-07-15: qty 48, 90d supply, fill #0

## 2024-07-15 NOTE — Telephone Encounter (Signed)
 Refills loaded, pended and sent to you.

## 2024-07-17 ENCOUNTER — Encounter

## 2024-07-23 ENCOUNTER — Ambulatory Visit: Admitting: Dermatology

## 2024-07-23 ENCOUNTER — Encounter: Payer: Self-pay | Admitting: Dermatology

## 2024-07-23 VITALS — BP 139/92

## 2024-07-23 DIAGNOSIS — L570 Actinic keratosis: Secondary | ICD-10-CM

## 2024-07-23 DIAGNOSIS — L739 Follicular disorder, unspecified: Secondary | ICD-10-CM

## 2024-07-23 DIAGNOSIS — N939 Abnormal uterine and vaginal bleeding, unspecified: Secondary | ICD-10-CM | POA: Diagnosis not present

## 2024-07-23 DIAGNOSIS — D225 Melanocytic nevi of trunk: Secondary | ICD-10-CM | POA: Diagnosis not present

## 2024-07-23 DIAGNOSIS — W908XXA Exposure to other nonionizing radiation, initial encounter: Secondary | ICD-10-CM

## 2024-07-23 DIAGNOSIS — D485 Neoplasm of uncertain behavior of skin: Secondary | ICD-10-CM | POA: Diagnosis not present

## 2024-07-23 DIAGNOSIS — Z85828 Personal history of other malignant neoplasm of skin: Secondary | ICD-10-CM | POA: Diagnosis not present

## 2024-07-23 DIAGNOSIS — D099 Carcinoma in situ, unspecified: Secondary | ICD-10-CM

## 2024-07-23 DIAGNOSIS — D1801 Hemangioma of skin and subcutaneous tissue: Secondary | ICD-10-CM

## 2024-07-23 DIAGNOSIS — C4491 Basal cell carcinoma of skin, unspecified: Secondary | ICD-10-CM

## 2024-07-23 DIAGNOSIS — L814 Other melanin hyperpigmentation: Secondary | ICD-10-CM

## 2024-07-23 DIAGNOSIS — L738 Other specified follicular disorders: Secondary | ICD-10-CM | POA: Diagnosis not present

## 2024-07-23 DIAGNOSIS — D229 Melanocytic nevi, unspecified: Secondary | ICD-10-CM

## 2024-07-23 DIAGNOSIS — L821 Other seborrheic keratosis: Secondary | ICD-10-CM | POA: Diagnosis not present

## 2024-07-23 DIAGNOSIS — Z86007 Personal history of in-situ neoplasm of skin: Secondary | ICD-10-CM

## 2024-07-23 DIAGNOSIS — L578 Other skin changes due to chronic exposure to nonionizing radiation: Secondary | ICD-10-CM | POA: Diagnosis not present

## 2024-07-23 DIAGNOSIS — D0472 Carcinoma in situ of skin of left lower limb, including hip: Secondary | ICD-10-CM | POA: Diagnosis not present

## 2024-07-23 DIAGNOSIS — Z1283 Encounter for screening for malignant neoplasm of skin: Secondary | ICD-10-CM

## 2024-07-23 NOTE — Progress Notes (Signed)
 Total Body Skin Exam (TBSE) Visit   Subjective  Megan Mcmillan is a 48 y.o. female ESTABLISHED PATIENT who presents for the following:  Total Body Skin Exam (TBSE)  Patient was last evaluated for TBSE on 06/06/23 .  Patient does have spots of concern to be evaluated. She does apply sunscreen and/or wears protective coverings.   Hx of Bx: BCC - R mid-back Ex 06/17/24 BCC - L lower back Ex on 09/12/23 SCC - L shin MOHS 08/30/23 SCCIS - R lower leg anterior - Tx with 5FU - Rx on 07/25/23   The patient has spots, moles and lesions to be evaluated, some may be new or changing and the patient has concerns that these could be cancer.  The following portions of the chart were reviewed this encounter and updated as appropriate: medications, allergies, medical history  Review of Systems:  No other skin or systemic complaints except as noted in HPI or Assessment and Plan.  Objective  Well appearing patient in no apparent distress; mood and affect are within normal limits.  A full examination was performed including scalp, head, eyes, ears, nose, lips, neck, chest, axillae, abdomen, back, buttocks, bilateral upper extremities, bilateral lower extremities, hands, feet, fingers, toes, fingernails, and toenails. All findings within normal limits unless otherwise noted below.   Relevant physical exam findings are noted in the Assessment and Plan.                      Chest, R arm, L lower leg, R lower leg (15) Erythematous thin papules/macules with gritty scale.  Mid Back 5mm pink pearly papule Left Lower Leg - Anterior 6mm pink pearly papule R lateral calf 6mm irregular brown macule R lower back 5mm irregular brown macule in tattoo   Assessment & Plan   HISTORY OF BASAL CELL CARCINOMA OF THE SKIN - R mid-back Ex 06/17/24 & L lower back Ex on 09/12/23 - No evidence of recurrence today - Recommend regular full body skin exams - Recommend daily broad spectrum sunscreen SPF 30+ to  sun-exposed areas, reapply every 2 hours as needed.  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  - Call if any new or changing lesions are noted between office visits   LENTIGINES, SEBORRHEIC KERATOSES, HEMANGIOMAS - Benign normal skin lesions - Benign-appearing - Call for any changes  BENIGN MELANOCYTIC NEVI - Tan-brown and/or pink-flesh-colored symmetric macules and papules - Benign appearing on exam today - Observation - Call clinic for new or changing moles - Recommend daily use of broad spectrum spf 30+ sunscreen to sun-exposed areas.   ACTINIC DAMAGE - Chronic condition, secondary to cumulative UV/sun exposure - diffuse scaly erythematous macules with underlying dyspigmentation - Recommend daily broad spectrum sunscreen SPF 30+ to sun-exposed areas, reapply every  2 hours as needed.  - Staying in the shade or wearing long sleeves, sun glasses (UVA+UVB protection) and wide brim hats (4-inch brim around the entire circumference of the hat) are also recommended for sun protection.  - Call for new or changing lesions.   SKIN CANCER SCREENING PERFORMED TODAY.   BASAL CELL CARCINOMA (BCC), UNSPECIFIED SITE   SQUAMOUS CELL CARCINOMA IN SITU (SCCIS)   AK (ACTINIC KERATOSIS) (15) Chest, R arm, L lower leg, R lower leg  (15) - Destruction of lesion - Chest, R arm, L lower leg, R lower leg (15) Complexity: simple   Destruction method: cryotherapy   Informed consent: discussed and consent obtained   Timeout:  patient name, date of birth, surgical site, and procedure verified Lesion destroyed using liquid nitrogen: Yes   Region frozen until ice ball extended beyond lesion: Yes   Outcome: patient tolerated procedure well with no complications   Post-procedure details: wound care instructions given    NEOPLASM OF UNCERTAIN BEHAVIOR OF SKIN (4) Mid Back - Skin / nail biopsy Type of biopsy: tangential   Informed consent: discussed and consent obtained   Timeout: patient name, date of birth, surgical site, and procedure verified   Procedure prep:  Patient was prepped and draped in usual sterile fashion Prep type:  Isopropyl alcohol Anesthesia: the lesion was anesthetized in a standard fashion   Anesthetic:  1% lidocaine w/ epinephrine  1-100,000 buffered w/ 8.4% NaHCO3 Instrument used: DermaBlade   Hemostasis achieved with: aluminum chloride   Outcome: patient tolerated procedure well   Post-procedure details: sterile dressing applied and wound care instructions given   Dressing type: petrolatum gauze and bandage    Left Lower Leg - Anterior - Skin / nail biopsy Type of biopsy: tangential   Informed consent: discussed and consent obtained   Timeout: patient name, date of birth, surgical site, and procedure verified   Procedure prep:  Patient was prepped and draped in usual sterile fashion Prep type:  Isopropyl alcohol Anesthesia: the lesion was anesthetized in a standard fashion   Anesthetic:  1% lidocaine w/ epinephrine  1-100,000 buffered w/ 8.4% NaHCO3 Instrument used: DermaBlade   Hemostasis achieved with: aluminum chloride   Outcome: patient tolerated procedure well   Post-procedure details: sterile dressing applied and wound care instructions given   Dressing type: petrolatum gauze and bandage     R lateral calf - Skin / nail biopsy Type of biopsy: tangential   Informed consent: discussed and consent obtained   Timeout: patient name, date of birth, surgical site, and procedure verified   Procedure prep:  Patient was prepped and draped in usual sterile fashion Prep type:  Isopropyl alcohol Anesthesia: the lesion was anesthetized in a standard fashion   Anesthetic:  1% lidocaine w/ epinephrine  1-100,000 buffered w/ 8.4% NaHCO3 Instrument used: DermaBlade   Hemostasis achieved with: aluminum chloride   Outcome: patient tolerated procedure well   Post-procedure details: sterile dressing applied and wound care instructions given   Dressing type: petrolatum gauze and bandage    R lower back - Skin / nail biopsy Type of biopsy: tangential   Informed consent: discussed and consent obtained   Timeout: patient name, date of birth, surgical site, and procedure verified   Procedure prep:  Patient was prepped and draped in usual sterile fashion Prep type:  Isopropyl alcohol Anesthesia: the lesion was anesthetized in a standard fashion   Anesthetic:  1% lidocaine w/ epinephrine  1-100,000 buffered w/ 8.4% NaHCO3 Instrument used: DermaBlade   Hemostasis achieved with:  aluminum chloride   Outcome: patient tolerated procedure well   Post-procedure details: sterile dressing applied and wound care instructions given   Dressing type: petrolatum gauze and bandage    Return in about 6 months (around 01/21/2025) for TBSE.   Documentation: I have reviewed the above documentation for accuracy and completeness, and I agree with the above.  I, Shirron Maranda, CMA II, am acting as scribe for:  Delon Lenis, DO

## 2024-07-23 NOTE — Patient Instructions (Addendum)

## 2024-07-24 LAB — SURGICAL PATHOLOGY

## 2024-07-28 ENCOUNTER — Encounter: Payer: Self-pay | Admitting: Dermatology

## 2024-08-04 ENCOUNTER — Other Ambulatory Visit (HOSPITAL_BASED_OUTPATIENT_CLINIC_OR_DEPARTMENT_OTHER): Payer: Self-pay

## 2024-08-04 MED ORDER — ESTRADIOL 0.025 MG/24HR TD PTTW
1.0000 | MEDICATED_PATCH | TRANSDERMAL | 3 refills | Status: AC
  Filled 2024-08-04: qty 8, 28d supply, fill #0

## 2024-08-11 ENCOUNTER — Ambulatory Visit: Payer: Self-pay | Admitting: Dermatology

## 2024-08-11 NOTE — Progress Notes (Signed)
 Hi Shirron,  Please call pt with the following pathology results.  FINAL DIAGNOSIS        1. Skin, mid back : Benign, no tx necessary      RUPTURED FOLLICULITIS, LIMITED MARGINS FREE        2. Skin, left lower leg - anterior : --> FU w/ Dr Alm for [facility]      SQUAMOUS CELL CARCINOMA IN SITU, PERIPHERAL MARGIN INVOLVED        3. Skin, R lateral calf : Benign, no tx necessary      PIGMENTED SEBORRHEIC KERATOSIS, EARLY, LIMITED MARGINS FREE        4. Skin, R lower back : Benign, no tx necessary      DYSPLASTIC JUNCTIONAL LENTIGINOUS NEVUS WITH MODERATE ATYPIA, LIMITED MARGINS       FREE

## 2024-08-25 ENCOUNTER — Encounter: Payer: Self-pay | Admitting: Dermatology

## 2024-08-25 DIAGNOSIS — D099 Carcinoma in situ, unspecified: Secondary | ICD-10-CM

## 2024-08-27 ENCOUNTER — Other Ambulatory Visit (HOSPITAL_BASED_OUTPATIENT_CLINIC_OR_DEPARTMENT_OTHER): Payer: Self-pay

## 2024-08-27 ENCOUNTER — Other Ambulatory Visit: Payer: Self-pay | Admitting: Dermatology

## 2024-08-27 DIAGNOSIS — D099 Carcinoma in situ, unspecified: Secondary | ICD-10-CM

## 2024-08-27 MED ORDER — TOLAK 4 % EX CREA
1.0000 | TOPICAL_CREAM | Freq: Two times a day (BID) | CUTANEOUS | 0 refills | Status: AC
  Filled 2024-08-27: qty 40, fill #0

## 2024-08-27 NOTE — Telephone Encounter (Signed)
 Yes, refill of [place]ak  is ok.

## 2024-09-01 ENCOUNTER — Other Ambulatory Visit (HOSPITAL_BASED_OUTPATIENT_CLINIC_OR_DEPARTMENT_OTHER): Payer: Self-pay

## 2024-09-07 ENCOUNTER — Telehealth: Payer: Self-pay | Admitting: Family Medicine

## 2024-09-07 NOTE — Telephone Encounter (Signed)
 LVM to r/s appt from 09/09/24 due to weather

## 2024-09-09 ENCOUNTER — Encounter: Payer: 59 | Admitting: Family Medicine

## 2024-09-12 ENCOUNTER — Other Ambulatory Visit (HOSPITAL_BASED_OUTPATIENT_CLINIC_OR_DEPARTMENT_OTHER): Payer: Self-pay

## 2024-10-01 ENCOUNTER — Encounter: Admitting: Family Medicine

## 2024-10-01 ENCOUNTER — Ambulatory Visit: Admitting: Dermatology

## 2025-05-13 ENCOUNTER — Ambulatory Visit: Admitting: Dermatology
